# Patient Record
Sex: Female | Born: 1970 | Race: White | Hispanic: No | Marital: Single | Smoking: Current every day smoker
Health system: Southern US, Community
[De-identification: ages and names within clinical notes are randomized; demographics above are authoritative.]

## PROBLEM LIST (undated history)

## (undated) DIAGNOSIS — F419 Anxiety disorder, unspecified: Secondary | ICD-10-CM

## (undated) DIAGNOSIS — E538 Deficiency of other specified B group vitamins: Secondary | ICD-10-CM

## (undated) DIAGNOSIS — R000 Tachycardia, unspecified: Secondary | ICD-10-CM

## (undated) DIAGNOSIS — R87610 Atypical squamous cells of undetermined significance on cytologic smear of cervix (ASC-US): Secondary | ICD-10-CM

## (undated) DIAGNOSIS — K219 Gastro-esophageal reflux disease without esophagitis: Secondary | ICD-10-CM

## (undated) DIAGNOSIS — Z720 Tobacco use: Secondary | ICD-10-CM

## (undated) DIAGNOSIS — K469 Unspecified abdominal hernia without obstruction or gangrene: Secondary | ICD-10-CM

## (undated) DIAGNOSIS — D649 Anemia, unspecified: Secondary | ICD-10-CM

## (undated) DIAGNOSIS — E039 Hypothyroidism, unspecified: Secondary | ICD-10-CM

## (undated) DIAGNOSIS — E785 Hyperlipidemia, unspecified: Secondary | ICD-10-CM

## (undated) DIAGNOSIS — E669 Obesity, unspecified: Secondary | ICD-10-CM

## (undated) DIAGNOSIS — E119 Type 2 diabetes mellitus without complications: Secondary | ICD-10-CM

## (undated) HISTORY — DX: Unspecified abdominal hernia without obstruction or gangrene: K46.9

## (undated) HISTORY — PX: TYMPANOSTOMY TUBE PLACEMENT: SHX32

## (undated) HISTORY — DX: Gastro-esophageal reflux disease without esophagitis: K21.9

## (undated) HISTORY — DX: Atypical squamous cells of undetermined significance on cytologic smear of cervix (ASC-US): R87.610

## (undated) HISTORY — DX: Anxiety disorder, unspecified: F41.9

## (undated) HISTORY — PX: CHOLECYSTECTOMY: SHX55

## (undated) HISTORY — PX: APPENDECTOMY: SHX54

## (undated) HISTORY — DX: Type 2 diabetes mellitus without complications: E11.9

## (undated) HISTORY — DX: Deficiency of other specified B group vitamins: E53.8

## (undated) HISTORY — DX: Tachycardia, unspecified: R00.0

## (undated) HISTORY — PX: BLADDER SURGERY: SHX569

## (undated) HISTORY — DX: Obesity, unspecified: E66.9

## (undated) HISTORY — DX: Hyperlipidemia, unspecified: E78.5

## (undated) HISTORY — PX: WISDOM TOOTH EXTRACTION: SHX21

## (undated) HISTORY — DX: Tobacco use: Z72.0

## (undated) HISTORY — DX: Hypothyroidism, unspecified: E03.9

## (undated) HISTORY — DX: Anemia, unspecified: D64.9

---

## 2006-05-17 ENCOUNTER — Emergency Department: Payer: Self-pay | Admitting: Emergency Medicine

## 2008-07-19 ENCOUNTER — Emergency Department: Payer: Self-pay | Admitting: Emergency Medicine

## 2008-08-14 ENCOUNTER — Ambulatory Visit: Payer: Self-pay | Admitting: Family Medicine

## 2008-08-15 ENCOUNTER — Observation Stay: Payer: Self-pay

## 2010-05-08 ENCOUNTER — Ambulatory Visit: Payer: Self-pay

## 2011-11-14 ENCOUNTER — Ambulatory Visit: Payer: Self-pay | Admitting: Obstetrics and Gynecology

## 2011-11-14 LAB — CBC WITH DIFFERENTIAL/PLATELET
Basophil %: 0.2 %
Eosinophil #: 0.3 10*3/uL (ref 0.0–0.7)
Lymphocyte #: 2 10*3/uL (ref 1.0–3.6)
Lymphocyte %: 19.7 %
MCHC: 33.7 g/dL (ref 32.0–36.0)
MCV: 89 fL (ref 80–100)
Monocyte #: 0.5 10*3/uL (ref 0.0–0.7)
Monocyte %: 5 %
Neutrophil #: 7.3 10*3/uL — ABNORMAL HIGH (ref 1.4–6.5)
Neutrophil %: 72.6 %
Platelet: 223 10*3/uL (ref 150–440)
RDW: 14.8 % — ABNORMAL HIGH (ref 11.5–14.5)
WBC: 10.1 10*3/uL (ref 3.6–11.0)

## 2011-11-17 ENCOUNTER — Inpatient Hospital Stay: Payer: Self-pay | Admitting: Obstetrics and Gynecology

## 2011-11-17 HISTORY — PX: TUBAL LIGATION: SHX77

## 2011-11-18 LAB — HEMATOCRIT: HCT: 33 % — ABNORMAL LOW (ref 35.0–47.0)

## 2012-12-15 ENCOUNTER — Emergency Department: Payer: Self-pay | Admitting: Emergency Medicine

## 2012-12-15 LAB — COMPREHENSIVE METABOLIC PANEL
Glucose: 99 mg/dL (ref 65–99)
SGOT(AST): 20 U/L (ref 15–37)
SGPT (ALT): 22 U/L (ref 12–78)
Sodium: 136 mmol/L (ref 136–145)
Total Protein: 7.9 g/dL (ref 6.4–8.2)

## 2012-12-15 LAB — URINALYSIS, COMPLETE
Blood: NEGATIVE
Glucose,UR: NEGATIVE mg/dL (ref 0–75)
Ketone: NEGATIVE
Nitrite: NEGATIVE
Ph: 5 (ref 4.5–8.0)
Protein: NEGATIVE
RBC,UR: 1 /HPF (ref 0–5)
WBC UR: 11 /HPF (ref 0–5)

## 2012-12-15 LAB — CBC
HCT: 40.3 % (ref 35.0–47.0)
HGB: 13.3 g/dL (ref 12.0–16.0)
MCHC: 33.1 g/dL (ref 32.0–36.0)
Platelet: 263 10*3/uL (ref 150–440)
RBC: 4.74 10*6/uL (ref 3.80–5.20)
WBC: 10.2 10*3/uL (ref 3.6–11.0)

## 2012-12-15 LAB — LIPASE, BLOOD: Lipase: 143 U/L (ref 73–393)

## 2012-12-15 LAB — PREGNANCY, URINE: Pregnancy Test, Urine: NEGATIVE m[IU]/mL

## 2014-10-04 ENCOUNTER — Ambulatory Visit (INDEPENDENT_AMBULATORY_CARE_PROVIDER_SITE_OTHER): Payer: BC Managed Care – PPO | Admitting: Podiatry

## 2014-10-04 ENCOUNTER — Other Ambulatory Visit: Payer: Self-pay | Admitting: *Deleted

## 2014-10-04 ENCOUNTER — Ambulatory Visit (INDEPENDENT_AMBULATORY_CARE_PROVIDER_SITE_OTHER): Payer: BC Managed Care – PPO

## 2014-10-04 ENCOUNTER — Encounter: Payer: Self-pay | Admitting: Podiatry

## 2014-10-04 VITALS — BP 120/71 | HR 99 | Resp 16 | Ht 67.0 in | Wt 280.0 lb

## 2014-10-04 DIAGNOSIS — M722 Plantar fascial fibromatosis: Secondary | ICD-10-CM

## 2014-10-04 DIAGNOSIS — Q828 Other specified congenital malformations of skin: Secondary | ICD-10-CM

## 2014-10-05 NOTE — Progress Notes (Signed)
She presents today requesting orthotics. She would also like for me to trim her reactive hyperkeratotic lesions and she states that her large toenails seem to bother her.  Objective: Vital signs are stable she is alert and oriented 3. Pulses are palpable bilateral. She has some tenderness on range of motion and palpation of the first metatarsophalangeal joints bilaterally. There is no erythema edema saline as drainage or odor area she has mild tenderness on palpation of the medial calcaneal tubercles bilateral.  Assessment: Plantar fasciitis bilateral. Reactive hyperkeratosis plantar aspect of the forefoot bilaterally left greater than right.  Plan: Debridement of porokeratotic lesions. She was scanned for a set of orthotics today.

## 2014-10-25 ENCOUNTER — Encounter: Payer: Self-pay | Admitting: Podiatry

## 2014-10-25 ENCOUNTER — Ambulatory Visit (INDEPENDENT_AMBULATORY_CARE_PROVIDER_SITE_OTHER): Payer: BC Managed Care – PPO | Admitting: Podiatry

## 2014-10-25 VITALS — BP 120/71 | HR 99 | Resp 16

## 2014-10-25 DIAGNOSIS — M722 Plantar fascial fibromatosis: Secondary | ICD-10-CM

## 2014-10-25 NOTE — Progress Notes (Signed)
Pt presents to pick up orthotics. Went over wearing instructions with pt.

## 2014-10-25 NOTE — Patient Instructions (Signed)

## 2014-11-29 ENCOUNTER — Ambulatory Visit: Payer: BC Managed Care – PPO | Admitting: Podiatry

## 2015-02-02 DIAGNOSIS — R87610 Atypical squamous cells of undetermined significance on cytologic smear of cervix (ASC-US): Secondary | ICD-10-CM

## 2015-02-02 HISTORY — DX: Atypical squamous cells of undetermined significance on cytologic smear of cervix (ASC-US): R87.610

## 2015-02-07 LAB — HM PAP SMEAR: HM PAP: NEGATIVE

## 2015-02-25 NOTE — Discharge Summary (Signed)
PATIENT NAME:  Lynn French, Lynn French MR#:  [id number] DATE OF BIRTH:  [birth year]  DATE OF ADMISSION:  11/17/2011 DATE OF DISCHARGE:  11/20/2011  PRINCIPLE PROCEDURES: Elective repeat cesarean section, elective bilateral tubal ligation for sterilization.   HOSPITAL COURSE: Postop day #1 hematocrit 33.6. Patient did well throughout hospitalization. Discharged to home in good condition postop day #3 in good condition.   MEDICATIONS: 1. Vicodin. 2. Prenatal vitamins. 3. Iron.  FOLLOW UP: Patient will follow up with Dr. Ouida Sills in two weeks or before if she has wound drainage, fever, nausea, vomiting.   ____________________________ Boykin Nearing, MD tjs:cms D: 11/25/2011 12:28:42 ET T: 11/26/2011 10:31:04 ET JOB#: [id number]  cc: Boykin Nearing, MD, <Dictator> Boykin Nearing MD ELECTRONICALLY SIGNED 11/27/2011 20:33

## 2015-02-25 NOTE — Op Note (Signed)
PATIENT NAME:  Lynn French, Lynn French MR#:  [id number] DATE OF BIRTH:  1971/04/11  DATE OF PROCEDURE:  11/17/2011  PREOPERATIVE DIAGNOSES: 1. Elective repeat cesarean section.  2. Elective permanent sterilization.   POSTOPERATIVE DIAGNOSES:   1. Elective repeat cesarean section.  2. Elective permanent sterilization.   PROCEDURES:  1. Repeat low transverse cesarean section.  2. P[place]oy bilateral tubal ligation.  3. On-Q pump placement.   ANESTHESIA: Spinal.   SURGEON: Boykin Nearing, MD  FIRST ASSISTANT: [address], [place]   INDICATIONS: A 44 year old gravida 32, para 1 patient at 59 + 2 weeks estimated gestational age. Patient with prior cesarean section. Patient has elected for permanent sterilization and reconfirms this desire today, the day of the procedure.   DESCRIPTION OF PROCEDURE: After adequate spinal anesthesia, patient was placed in dorsal supine position, hip roll under the right side. Patient had previously received 3 grams intravenous Ancef for prophylaxis. Patient's abdomen was prepped and draped in normal sterile fashion. Pfannenstiel incision was made two fingerbreadths above the symphysis pubis. Sharp dissection was used to identify the fascia. Fascia was opened in the midline and opened in a transverse fashion. Superior aspect of the fascia was grasped with Kocher clamps. Recti muscles dissected free. Inferior aspect of the fascia was grasped with Kocher clamps and pyramidalis muscle was dissected free. Entry into the peritoneal cavity was accomplished sharply. Bladder blade was used to reflect the bladder inferiorly and a low transverse uterine incision was made. Upon entry into the endometrial cavity, clear fluid resulted. Incision was extended with blunt transverse traction. Fetal head was brought to the incision. Vacuum was applied to the occiput. With one gentle pull, delivery of the fetal head was accomplished. Vacuum was removed and the shoulders, body delivered  without difficulty. Vigorous female. Cord was doubly clamped and infant was passed to neonatologist, Dr. Tona Sensing, who assigned Apgar scores of 9 and 9. Intravenous Pitocin was administered while the placenta was manually delivered and the uterus was exteriorized. 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 The endometrial cavity was wiped clean with laparotomy tape and the sponge stick was used to open the cervix. Uterine incision was closed with one chromic suture in a running locking fashion, good approximation of edges, good hemostasis noted. Attention directed to the patient's right fallopian tube which was grasped at the midportion with a Babcock clamp. Two separate 0 plain gut sutures applied, 1.5 cm portion of fallopian tube removed on the right. Similar procedure was repeated on the patient's left after placing two separate 0 plain gut sutures, 1.5 cm portion of fallopian tube removed. Portion of fallopian tube sent to pathology for identification. Posterior cul-de-sac irrigated and suctioned. Uterus placed back into the abdominal cavity and the paracolic gutters were wiped cleaned with laparotomy tape. The tubal ligation sites appeared hemostatic as well as the uterine incision site. Interceed placed over the uterine incision in a T-shaped fashion. At this point the On-Q pump placement was brought up to the operative field and placed subfascially in standard fashion. Fascia was closed over top of this with 0 Vicryl suture in a running nonlocking fashion. Subcutaneous tissues were irrigated and bovied for hemostasis. Given the depth of the subcutaneous tissues, Scarpa's fascia was closed with a running 2-0 chromic suture. The rest of the skin was reapproximated with staples. Dermabond used to secure the catheters sites superior to the incision and catheters were loaded with 5 mL of 0.5% Marcaine through both catheters. Tegaderm dressing  applied over the catheters. There were no  complications. Estimated blood loss 600 mL. Intraoperative fluids 1200 mL. Patient tolerated the procedure well.  ____________________________ Boykin Nearing, MD tjs:cms D: 11/17/2011 09:07:47 ET T: 11/17/2011 09:15:36 ET JOB#: [id number]  cc: Boykin Nearing, MD, <Dictator> Boykin Nearing MD ELECTRONICALLY SIGNED 11/19/2011 9:29

## 2015-06-08 DIAGNOSIS — E039 Hypothyroidism, unspecified: Secondary | ICD-10-CM | POA: Insufficient documentation

## 2015-06-08 DIAGNOSIS — Z720 Tobacco use: Secondary | ICD-10-CM | POA: Insufficient documentation

## 2015-06-08 DIAGNOSIS — F17210 Nicotine dependence, cigarettes, uncomplicated: Secondary | ICD-10-CM | POA: Insufficient documentation

## 2015-06-08 DIAGNOSIS — E119 Type 2 diabetes mellitus without complications: Secondary | ICD-10-CM | POA: Insufficient documentation

## 2015-06-08 DIAGNOSIS — E785 Hyperlipidemia, unspecified: Secondary | ICD-10-CM | POA: Insufficient documentation

## 2015-06-08 DIAGNOSIS — F419 Anxiety disorder, unspecified: Secondary | ICD-10-CM | POA: Insufficient documentation

## 2015-06-20 ENCOUNTER — Encounter: Payer: Self-pay | Admitting: Family Medicine

## 2015-06-20 ENCOUNTER — Ambulatory Visit (INDEPENDENT_AMBULATORY_CARE_PROVIDER_SITE_OTHER): Payer: BLUE CROSS/BLUE SHIELD | Admitting: Family Medicine

## 2015-06-20 VITALS — BP 116/79 | HR 95 | Temp 97.9°F | Ht 67.5 in | Wt 275.0 lb

## 2015-06-20 DIAGNOSIS — E038 Other specified hypothyroidism: Secondary | ICD-10-CM

## 2015-06-22 NOTE — Progress Notes (Signed)
Patient was NOT seen by provider today She rescheduled

## 2015-06-22 NOTE — Assessment & Plan Note (Signed)
Reason for today's visit, but patient rescheduled and WAS NOT SEEN

## 2015-07-04 ENCOUNTER — Ambulatory Visit (INDEPENDENT_AMBULATORY_CARE_PROVIDER_SITE_OTHER): Payer: BLUE CROSS/BLUE SHIELD | Admitting: Family Medicine

## 2015-07-04 ENCOUNTER — Encounter: Payer: Self-pay | Admitting: Family Medicine

## 2015-07-04 DIAGNOSIS — Z720 Tobacco use: Secondary | ICD-10-CM

## 2015-07-04 DIAGNOSIS — E038 Other specified hypothyroidism: Secondary | ICD-10-CM | POA: Diagnosis not present

## 2015-07-04 MED ORDER — THYROID 120 MG PO TABS: 120.0000 mg | ORAL_TABLET | Freq: Every day | ORAL | Status: DC

## 2015-07-04 NOTE — Assessment & Plan Note (Signed)
She is not quite ready to quit at this time; I am here to help her quit when she is ready; she was able to quit for 6 months once, so she can do it again

## 2015-07-04 NOTE — Patient Instructions (Signed)
Check out the information at familydoctor.org entitled "What It Takes to Lose Weight" Try to lose between 1-2 pounds per week by taking in fewer calories and burning off more calories You can succeed by limiting portions, limiting foods dense in calories and fat, becoming more active, and drinking 8 glasses of water a day (64 ounces a day) Don't skip meals, especially breakfast, as skipping meals may alter your metabolism Do not use over-the-counter weight loss pills or gimmicks that claim rapid weight loss A healthy BMI (or body mass index) is between 18.5 and 24.9 You can calculate your ideal BMI at the [place] website [employer]fr Return on or after October 7th for fasting labs for diabetes We'll see how your progress is coming along in October

## 2015-07-04 NOTE — Assessment & Plan Note (Signed)
On same dose of meds for years; last two TSH levels reviewed; not due again until April 2017; refills given

## 2015-07-04 NOTE — Progress Notes (Signed)
BP 142/89 mmHg  Pulse 97  Temp(Src) 97.9 F (36.6 C)  Ht 5' 7.5" (1.715 m)  Wt 273 lb (123.832 kg)  BMI 42.10 kg/m2  SpO2 96%  LMP 06/09/2015   Subjective:    Patient ID: Lynn French, female    DOB: [birth year], 44 y.o.   MRN: [id number]  HPI: Lynn French is a 44 y.o. female  Chief Complaint  Patient presents with  . Hypothyroidism  . Obesity   She has been moving, has not had access to kitchen; has not been able to eat well; finally moved in now; going to work on it; she is thinking about pasta salad with light [place] salad dressing; going to do better; weight stable overall; does not think dietician needed at this point  Last TSH was normal in April; no constipation or loose stools; hair not falling out; skin feels normal; weight relatively stable; needs refills of the thyroid medicine; has been on thyroid medicine since seeing Dr. Ronnald Collum, saw him for several years; on medicine roughly ten years or more; dose has been stable for the last year; no radioactive thyroid treatment or surgery; has no trouble with food sticking  Relevant past medical, surgical, family and social history reviewed and updated as indicated. Interim medical history since our last visit reviewed. Allergies and medications reviewed and updated.  Review of Systems  Skin: Positive for rash (on arms and back, got into something from moving).   Per HPI unless specifically indicated above     Objective:    BP 142/89 mmHg  Pulse 97  Temp(Src) 97.9 F (36.6 C)  Ht 5' 7.5" (1.715 m)  Wt 273 lb (123.832 kg)  BMI 42.10 kg/m2  SpO2 96%  LMP 06/09/2015  Wt Readings from Last 3 Encounters:  07/04/15 273 lb (123.832 kg)  06/20/15 275 lb (124.739 kg)  02/16/15 275 lb (124.739 kg)    Physical Exam  Constitutional: She appears well-developed and well-nourished. No distress.  HENT:  Head: Normocephalic and atraumatic.  Eyes: EOM are normal. No scleral icterus.  Neck: No thyromegaly present.   Cardiovascular: Normal rate, regular rhythm and normal heart sounds.   No murmur heard. Pulmonary/Chest: Effort normal and breath sounds normal. No respiratory distress. She has no wheezes.  Abdominal: Soft. Bowel sounds are normal. She exhibits no distension.  Musculoskeletal: Normal range of motion. She exhibits no edema.  Neurological: She is alert. She exhibits normal muscle tone.  Skin: Skin is warm and dry. Rash (diffuse erythematous rash on back and arms) noted. She is not diaphoretic. No pallor.  Psychiatric: She has a normal mood and affect. Her behavior is normal. Judgment and thought content normal.    Results for orders placed or performed in visit on 06/08/15  HM PAP SMEAR  Result Value Ref Range   HM Pap smear ASCUS, HPV Negative. Next due April 2017       Assessment & Plan:   Problem List Items Addressed This Visit      Endocrine   Hypothyroidism    On same dose of meds for years; last two TSH levels reviewed; not due again until April 2017; refills given      Relevant Medications   thyroid (ARMOUR THYROID) 120 MG tablet     Other   Tobacco use    She is not quite ready to quit at this time; I am here to help her quit when she is ready; she was able to quit for 6 months once,  so she can do it again      Morbid obesity - Primary    Discussed weight loss; healthier eating pattern, use the internet to get ideas, fruits and veggies in season; aim for 1-2 pounds of weight loss per week; discussed risk of obesity including cancer, stroke, heart attack, OA, etc.         Follow up plan: Return in about 6 weeks (around 08/13/2015) for fasting labs and diabetes f/u. An after-visit summary was printed and given to the patient at [facility].  Please see the patient instructions which may contain other information and recommendations beyond what is mentioned above in the assessment and plan.  Meds ordered this encounter  Medications  . omeprazole (PRILOSEC) 20 MG  capsule    Sig: Take 20 mg by mouth daily.  Marland Kitchen thyroid (ARMOUR THYROID) 120 MG tablet    Sig: Take 1 tablet (120 mg total) by mouth daily before breakfast.    Dispense:  30 tablet    Refill:  8

## 2015-07-04 NOTE — Assessment & Plan Note (Addendum)
Discussed weight loss; healthier eating pattern, use the internet to get ideas, fruits and veggies in season; aim for 1-2 pounds of weight loss per week; discussed risk of obesity including cancer, stroke, heart attack, OA, etc.

## 2015-08-15 ENCOUNTER — Ambulatory Visit: Payer: BLUE CROSS/BLUE SHIELD | Admitting: Family Medicine

## 2015-09-26 ENCOUNTER — Encounter: Payer: Self-pay | Admitting: Family Medicine

## 2016-04-01 ENCOUNTER — Other Ambulatory Visit: Payer: Self-pay

## 2016-04-01 NOTE — Telephone Encounter (Signed)
Called and spoke to patient because [facility] sent the refill request but they are not listed in the patient's chart. Patient states she will be using them but has enough medication to get to her appointment with [facility] 04/09/17.

## 2016-04-07 ENCOUNTER — Other Ambulatory Visit: Payer: Self-pay

## 2016-04-08 MED ORDER — THYROID 120 MG PO TABS: 120.0000 mg | ORAL_TABLET | Freq: Every day | ORAL | Status: DC

## 2016-04-09 ENCOUNTER — Other Ambulatory Visit: Payer: Self-pay

## 2016-04-09 ENCOUNTER — Encounter: Payer: Self-pay | Admitting: Unknown Physician Specialty

## 2016-04-09 ENCOUNTER — Ambulatory Visit (INDEPENDENT_AMBULATORY_CARE_PROVIDER_SITE_OTHER): Payer: BLUE CROSS/BLUE SHIELD | Admitting: Unknown Physician Specialty

## 2016-04-09 VITALS — BP 108/88 | HR 83 | Temp 97.8°F | Ht 67.0 in | Wt 291.4 lb

## 2016-04-09 DIAGNOSIS — Z0000 Encounter for general adult medical examination without abnormal findings: Secondary | ICD-10-CM | POA: Diagnosis not present

## 2016-04-09 DIAGNOSIS — E038 Other specified hypothyroidism: Secondary | ICD-10-CM

## 2016-04-09 DIAGNOSIS — Z7251 High risk heterosexual behavior: Secondary | ICD-10-CM | POA: Diagnosis not present

## 2016-04-09 LAB — BAYER DCA HB A1C WAIVED: HB A1C (BAYER DCA - WAIVED): 5.8 % (ref <7.0)

## 2016-04-09 NOTE — Progress Notes (Signed)
-  Pulse 83  Temp(Src) 97.8 F (36.6 C)  Ht 5' 7"  (1.702 m)  Wt 291 lb 6.4 oz (132.178 kg)  BMI 45.63 kg/m2  SpO2 96%  LMP 04/07/2016 (Exact Date)   Subjective:    Patient ID: LADA FULBRIGHT, female    DOB: [birth year], 45 y.o.   MRN: [id number]  HPI: Lynn French is a 45 y.o. female  Chief Complaint  Patient presents with  . Hypothyroidism   Hypothyroid She has been on a stable dose for a period of time now.  Originally put on it by Dr. Felipa Evener.  She has had some weight gain in the last year.  She admits to not eating right.    STD testing States her youngest son's father has cheated on her.  She has no problems of pain or discharge but feels she should "keep a check."  Chart review notes history of Diabetes.  Pt states this is related to her time with Dr. Jacqulyn Cane who treated her for "pre pre diabetes"  Wants mammogram screening due to family history  Relevant past medical, surgical, family and social history reviewed and updated as indicated. Interim medical history since our last visit reviewed. Allergies and medications reviewed and updated.  Review of Systems  Constitutional: Negative.   HENT: Negative.   Eyes: Negative.   Respiratory: Negative.   Cardiovascular: Negative.   Gastrointestinal: Negative.   Endocrine: Negative.   Genitourinary: Negative.   Musculoskeletal: Negative.   Skin: Negative.   Allergic/Immunologic: Negative.   Neurological: Negative.   Hematological: Negative.   Psychiatric/Behavioral: Negative.     Per HPI unless specifically indicated above     Objective:    Pulse 83  Temp(Src) 97.8 F (36.6 C)  Ht 5' 7"  (1.702 m)  Wt 291 lb 6.4 oz (132.178 kg)  BMI 45.63 kg/m2  SpO2 96%  LMP 04/07/2016 (Exact Date)  Wt Readings from Last 3 Encounters:  04/09/16 291 lb 6.4 oz (132.178 kg)  07/04/15 273 lb (123.832 kg)  06/20/15 275 lb (124.739 kg)    Physical Exam  Constitutional: She is oriented to person, place, and time. She  appears well-developed and well-nourished. No distress.  HENT:  Head: Normocephalic and atraumatic.  Eyes: Conjunctivae and lids are normal. Right eye exhibits no discharge. Left eye exhibits no discharge. No scleral icterus.  Neck: Normal range of motion. Neck supple. No JVD present. Carotid bruit is not present.  Cardiovascular: Normal rate, regular rhythm and normal heart sounds.   Pulmonary/Chest: Effort normal and breath sounds normal.  Abdominal: Normal appearance. There is no splenomegaly or hepatomegaly.  Musculoskeletal: Normal range of motion.  Neurological: She is alert and oriented to person, place, and time.  Skin: Skin is warm, dry and intact. No rash noted. No pallor.  Psychiatric: She has a normal mood and affect. Her behavior is normal. Judgment and thought content normal.    Results for orders placed or performed in visit on 06/08/15  HM PAP SMEAR  Result Value Ref Range   HM Pap smear ASCUS, HPV Negative. Next due April 2017       Assessment & Plan:   Problem List Items Addressed This Visit      Unprioritized   Hypothyroidism - Primary   Relevant Orders   Thyroid Panel With TSH   Morbid obesity ([place])    Discussed diet      Relevant Orders   Bayer DCA Hb A1c Waived    Other Visit Diagnoses  High risk sexual behavior        Relevant Orders    RPR    GC/Chlamydia Probe Amp    HSV(herpes simplex vrs) 1+2 ab-IgG    HIV antibody    Routine general medical examination at a health care facility        Relevant Orders    MM DIGITAL SCREENING BILATERAL       Hgb A1C 5.8  I will remove DM from problem list.  She has been counseled on diet and exercise.    Follow up plan: Return for schedule PE.

## 2016-04-09 NOTE — Patient Instructions (Signed)
Please do call to schedule your mammogram; the number to schedule one at either [facility] or [facility] is [phone]   

## 2016-04-09 NOTE — Telephone Encounter (Signed)
Needs 90 day supply

## 2016-04-09 NOTE — Assessment & Plan Note (Signed)
Discussed diet  

## 2016-04-10 LAB — RPR: RPR: NONREACTIVE

## 2016-04-10 LAB — HIV ANTIBODY (ROUTINE TESTING W REFLEX): HIV Screen 4th Generation wRfx: NONREACTIVE

## 2016-04-10 LAB — HSV(HERPES SIMPLEX VRS) I + II AB-IGG
HSV 1 GLYCOPROTEIN G AB, IGG: 40.6 {index} — AB (ref 0.00–0.90)
HSV 2 Glycoprotein G Ab, IgG: 4.49 index — ABNORMAL HIGH (ref 0.00–0.90)

## 2016-04-10 LAB — THYROID PANEL WITH TSH
FREE THYROXINE INDEX: 1.5 (ref 1.2–4.9)
T3 UPTAKE RATIO: 22 % — AB (ref 24–39)
T4 TOTAL: 6.8 ug/dL (ref 4.5–12.0)
TSH: 0.797 u[IU]/mL (ref 0.450–4.500)

## 2016-04-10 MED ORDER — THYROID 120 MG PO TABS: 120.0000 mg | ORAL_TABLET | Freq: Every day | ORAL | Status: DC

## 2016-04-11 ENCOUNTER — Encounter: Payer: Self-pay | Admitting: Unknown Physician Specialty

## 2016-04-11 ENCOUNTER — Telehealth: Payer: Self-pay | Admitting: Unknown Physician Specialty

## 2016-04-11 LAB — GC/CHLAMYDIA PROBE AMP
CHLAMYDIA, DNA PROBE: NEGATIVE
Neisseria gonorrhoeae by PCR: NEGATIVE

## 2016-04-11 NOTE — Telephone Encounter (Signed)
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

## 2016-04-11 NOTE — Telephone Encounter (Signed)
Pt returned call

## 2016-04-11 NOTE — Telephone Encounter (Signed)
I did not call this patient. Lynn French, did you?

## 2016-05-19 ENCOUNTER — Ambulatory Visit (INDEPENDENT_AMBULATORY_CARE_PROVIDER_SITE_OTHER): Payer: BLUE CROSS/BLUE SHIELD | Admitting: Unknown Physician Specialty

## 2016-05-19 ENCOUNTER — Encounter: Payer: Self-pay | Admitting: Unknown Physician Specialty

## 2016-05-19 VITALS — BP 121/67 | HR 88 | Temp 97.9°F | Ht 67.7 in | Wt 291.8 lb

## 2016-05-19 DIAGNOSIS — F32A Depression, unspecified: Secondary | ICD-10-CM | POA: Insufficient documentation

## 2016-05-19 DIAGNOSIS — Z0000 Encounter for general adult medical examination without abnormal findings: Secondary | ICD-10-CM | POA: Diagnosis not present

## 2016-05-19 DIAGNOSIS — E669 Obesity, unspecified: Secondary | ICD-10-CM

## 2016-05-19 DIAGNOSIS — L0293 Carbuncle, unspecified: Secondary | ICD-10-CM | POA: Diagnosis not present

## 2016-05-19 DIAGNOSIS — K219 Gastro-esophageal reflux disease without esophagitis: Secondary | ICD-10-CM | POA: Diagnosis not present

## 2016-05-19 DIAGNOSIS — F329 Major depressive disorder, single episode, unspecified: Secondary | ICD-10-CM

## 2016-05-19 MED ORDER — BUPROPION HCL ER (SR) 150 MG PO TB12: 150.0000 mg | ORAL_TABLET | Freq: Two times a day (BID) | ORAL | Status: DC

## 2016-05-19 MED ORDER — OMEPRAZOLE 40 MG PO CPDR
40.0000 mg | DELAYED_RELEASE_CAPSULE | Freq: Every day | ORAL | Status: DC
Start: 2016-05-19 — End: 2017-03-18

## 2016-05-19 MED ORDER — MUPIROCIN CALCIUM 2 % EX CREA: 1.0000 "application " | TOPICAL_CREAM | Freq: Two times a day (BID) | CUTANEOUS | Status: DC

## 2016-05-19 NOTE — Assessment & Plan Note (Signed)
rx for antibiotic cream

## 2016-05-19 NOTE — Patient Instructions (Signed)
Psychology T0day: Find a therapist  Intermittnet Fasting

## 2016-05-19 NOTE — Assessment & Plan Note (Signed)
rx for Wellbutrin

## 2016-05-19 NOTE — Assessment & Plan Note (Signed)
Discussed diet and exercise 

## 2016-05-19 NOTE — Progress Notes (Signed)
+----------+  BP 121/67 mmHg  Pulse 88  Temp(Src) 97.9 F (36.6 C)  Ht 5' 7.7" (1.72 m)  Wt 291 lb 12.8 oz (132.36 kg)  BMI 44.74 kg/m2  SpO2 97%  LMP 05/07/2016 (Approximate)   Subjective:    Patient ID: Lynn French, female    DOB: 1971/09/20, 45 y.o.   MRN: [id number]  HPI: Lynn French is a 45 y.o. female  Chief Complaint  Patient presents with  . Annual Exam   Pt states she is stressed about a lot lately.  She states she took Lorazepmam in the past that she could take periodically.  She does admit to stress every day.  States she lacks motivation and feels her weight has a lot to do with this.  She works "all the time" and is a single mother.    Depression screen [facility] 2/9 05/19/2016 05/19/2016  Decreased Interest 1 0  Down, Depressed, Hopeless 3 1  PHQ - 2 Score 4 1  Altered sleeping 0 -  Tired, decreased energy 2 -  Change in appetite 1 -  Feeling bad or failure about yourself  2 -  Trouble concentrating 2 -  Moving slowly or fidgety/restless 2 -  Suicidal thoughts 0 -  PHQ-9 Score 13 -   Relevant past medical, surgical, family and social history reviewed and updated as indicated. Interim medical history since our last visit reviewed. Allergies and medications reviewed and updated.  Review of Systems  Constitutional: Negative.   HENT: Negative.   Eyes: Negative.   Respiratory: Negative.   Cardiovascular: Negative.   Gastrointestinal: Negative.        Would like 40 mg of Omeprazole  Endocrine: Negative.   Genitourinary: Negative.   Musculoskeletal: Negative.   Skin: Negative.        Recurrent boils under arms  Allergic/Immunologic: Negative.   Neurological: Negative.   Hematological: Negative.   Psychiatric/Behavioral: Negative.     Per HPI unless specifically indicated above     Objective:    BP 121/67 mmHg  Pulse 88  Temp(Src) 97.9 F (36.6 C)  Ht 5' 7.7" (1.72 m)  Wt 291 lb 12.8 oz (132.36 kg)  BMI 44.74 kg/m2  SpO2 97%  LMP 05/07/2016  (Approximate)  Wt Readings from Last 3 Encounters:  05/19/16 291 lb 12.8 oz (132.36 kg)  04/09/16 291 lb 6.4 oz (132.178 kg)  07/04/15 273 lb (123.832 kg)    Physical Exam  Constitutional: She is oriented to person, place, and time. She appears well-developed and well-nourished.  HENT:  Head: Normocephalic and atraumatic.  Eyes: Pupils are equal, round, and reactive to light. Right eye exhibits no discharge. Left eye exhibits no discharge. No scleral icterus.  Neck: Normal range of motion. Neck supple. Carotid bruit is not present. No thyromegaly present.  Cardiovascular: Normal rate, regular rhythm and normal heart sounds.  Exam reveals no gallop and no friction rub.   No murmur heard. Pulmonary/Chest: Effort normal and breath sounds normal. No respiratory distress. She has no wheezes. She has no rales.  Abdominal: Soft. Bowel sounds are normal. There is no tenderness. There is no rebound.  Genitourinary: Vagina normal and uterus normal. No breast swelling, tenderness or discharge. Cervix exhibits no motion tenderness, no discharge and no friability. Right adnexum displays no mass, no tenderness and no fullness. Left adnexum displays no mass, no tenderness and no fullness.  Musculoskeletal: Normal range of motion.  Lymphadenopathy:    She has no cervical adenopathy.  Neurological: She is  alert and oriented to person, place, and time.  Skin: Skin is warm, dry and intact. No rash noted.  Psychiatric: She has a normal mood and affect. Her speech is normal and behavior is normal. Judgment and thought content normal. Cognition and memory are normal.      Assessment & Plan:   Problem List Items Addressed This Visit      Unprioritized   Depression - Primary    rx for Wellbutrin      Relevant Medications   buPROPion (WELLBUTRIN SR) 150 MG 12 hr tablet   GERD (gastroesophageal reflux disease)    Would like an rx for 40 mg      Relevant Medications   omeprazole (PRILOSEC) 40 MG  capsule   Obesity    Discussed diet and exercise      Relevant Orders   Lipid Panel w/o Chol/HDL Ratio   Comprehensive metabolic panel   CBC with Differential/Platelet   Recurrent boils    rx for antibiotic cream      Relevant Medications   mupirocin cream (BACTROBAN) 2 %    Other Visit Diagnoses    Annual physical exam        Relevant Orders    IGP, Aptima HPV, rfx 16/18,45        Follow up plan: Return in about 4 weeks (around 06/16/2016).

## 2016-05-19 NOTE — Assessment & Plan Note (Signed)
Would like an rx for 40 mg

## 2016-05-20 ENCOUNTER — Encounter: Payer: Self-pay | Admitting: Unknown Physician Specialty

## 2016-05-20 LAB — LIPID PANEL W/O CHOL/HDL RATIO
CHOLESTEROL TOTAL: 165 mg/dL (ref 100–199)
HDL: 41 mg/dL (ref >39)
LDL CALC: 101 mg/dL — AB (ref 0–99)
Triglycerides: 114 mg/dL (ref 0–149)
VLDL CHOLESTEROL CAL: 23 mg/dL (ref 5–40)

## 2016-05-20 LAB — COMPREHENSIVE METABOLIC PANEL
A/G RATIO: 1.4 (ref 1.2–2.2)
ALK PHOS: 101 IU/L (ref 39–117)
ALT: 25 IU/L (ref 0–32)
AST: 24 IU/L (ref 0–40)
Albumin: 3.8 g/dL (ref 3.5–5.5)
BUN/Creatinine Ratio: 14 (ref 9–23)
BUN: 10 mg/dL (ref 6–24)
CALCIUM: 9 mg/dL (ref 8.7–10.2)
CO2: 26 mmol/L (ref 18–29)
Chloride: 102 mmol/L (ref 96–106)
Creatinine, Ser: 0.69 mg/dL (ref 0.57–1.00)
GFR, EST AFRICAN AMERICAN: 124 mL/min/{1.73_m2} (ref >59)
GFR, EST NON AFRICAN AMERICAN: 108 mL/min/{1.73_m2} (ref >59)
Globulin, Total: 2.8 g/dL (ref 1.5–4.5)
Glucose: 99 mg/dL (ref 65–99)
Potassium: 5 mmol/L (ref 3.5–5.2)
Sodium: 140 mmol/L (ref 134–144)
Total Protein: 6.6 g/dL (ref 6.0–8.5)

## 2016-05-20 LAB — CBC WITH DIFFERENTIAL/PLATELET
BASOS: 0 %
Basophils Absolute: 0 10*3/uL (ref 0.0–0.2)
EOS (ABSOLUTE): 0.6 10*3/uL — ABNORMAL HIGH (ref 0.0–0.4)
Eos: 7 %
HEMOGLOBIN: 12.2 g/dL (ref 11.1–15.9)
Hematocrit: 37.2 % (ref 34.0–46.6)
IMMATURE GRANS (ABS): 0 10*3/uL (ref 0.0–0.1)
IMMATURE GRANULOCYTES: 0 %
LYMPHS: 32 %
Lymphocytes Absolute: 2.7 10*3/uL (ref 0.7–3.1)
MCH: 28.2 pg (ref 26.6–33.0)
MCHC: 32.8 g/dL (ref 31.5–35.7)
MCV: 86 fL (ref 79–97)
MONOCYTES: 6 %
Monocytes Absolute: 0.5 10*3/uL (ref 0.1–0.9)
NEUTROS ABS: 4.5 10*3/uL (ref 1.4–7.0)
Neutrophils: 55 %
Platelets: 277 10*3/uL (ref 150–379)
RBC: 4.32 x10E6/uL (ref 3.77–5.28)
RDW: 15.6 % — ABNORMAL HIGH (ref 12.3–15.4)
WBC: 8.3 10*3/uL (ref 3.4–10.8)

## 2016-05-21 LAB — IGP, APTIMA HPV, RFX 16/18,45
HPV Aptima: NEGATIVE
PAP Smear Comment: 0

## 2016-06-17 ENCOUNTER — Telehealth: Payer: Self-pay | Admitting: Unknown Physician Specialty

## 2016-06-17 NOTE — Telephone Encounter (Signed)
Pts feet are swollen and feels like it may possibly be from her wellbutrin.

## 2016-06-17 NOTE — Telephone Encounter (Signed)
Called and spoke to patient. She said that her feet have been swollen for about a week now and have done this since starting the wellbutrin. Patient states she has an appointment scheduled for Monday but doesn't know what she should do or if a new medication needs to be sent in.

## 2016-06-18 MED ORDER — HYDROCHLOROTHIAZIDE 25 MG PO TABS: 25.0000 mg | ORAL_TABLET | Freq: Every day | ORAL | 0 refills | Status: DC

## 2016-06-18 NOTE — Telephone Encounter (Signed)
Called and notified patient of what Malachy Mood said. Let patient know about new med that was sent in and asked for her to give [place] a call if she has any questions.

## 2016-06-18 NOTE — Telephone Encounter (Signed)
At is not a typical side effect of Wellbutrin but that doesn't mean it's not due to the Wellbutrin.  I would like to call her in a fluid pill to see if it helps

## 2016-06-23 ENCOUNTER — Ambulatory Visit (INDEPENDENT_AMBULATORY_CARE_PROVIDER_SITE_OTHER): Payer: BLUE CROSS/BLUE SHIELD | Admitting: Unknown Physician Specialty

## 2016-06-23 ENCOUNTER — Encounter: Payer: Self-pay | Admitting: Unknown Physician Specialty

## 2016-06-23 DIAGNOSIS — F329 Major depressive disorder, single episode, unspecified: Secondary | ICD-10-CM

## 2016-06-23 DIAGNOSIS — F32A Depression, unspecified: Secondary | ICD-10-CM

## 2016-06-23 DIAGNOSIS — R600 Localized edema: Secondary | ICD-10-CM

## 2016-06-23 DIAGNOSIS — R609 Edema, unspecified: Secondary | ICD-10-CM | POA: Insufficient documentation

## 2016-06-23 MED ORDER — BUPROPION HCL ER (SR) 150 MG PO TB12: 150.0000 mg | ORAL_TABLET | Freq: Two times a day (BID) | ORAL | 1 refills | Status: DC

## 2016-06-23 NOTE — Assessment & Plan Note (Signed)
Improved.  Continue Wellbutrin

## 2016-06-23 NOTE — Progress Notes (Signed)
BP 135/84 (BP Location: Left Arm, Patient Position: Sitting, Cuff Size: Large)   Pulse 82   Temp 97.5 F (36.4 C)   Ht 5' 8.2" (1.732 m)   Wt 289 lb 6.4 oz (131.3 kg)   LMP 06/04/2016 (Approximate)   SpO2 96%   BMI 43.75 kg/m    Subjective:    Patient ID: Lynn French, female    DOB: August 14, 1971, 45 y.o.   MRN: [id number]  HPI: Lynn French is a 45 y.o. female  Chief Complaint  Patient presents with  . Depression    4 week f/up   About 4 weeks ago started her on Wellbutrin and states she is doing better.  She does report some blurred vision which seems more frequent.  She gets a headache a couple of times of week relieved by Tylenol.  She wonders if it is making her BP go up.  She did have some swelling in her feet and I called in some HCTZ and that seems betterl  Depression screen [facility] 2/9 06/23/2016 05/19/2016 05/19/2016  Decreased Interest - 1 0  Down, Depressed, Hopeless - 3 1  PHQ - 2 Score - 4 1  Altered sleeping 1 0 -  Tired, decreased energy 0 2 -  Change in appetite - 1 -  Feeling bad or failure about yourself  - 2 -  Trouble concentrating - 2 -  Moving slowly or fidgety/restless - 2 -  Suicidal thoughts - 0 -  PHQ-9 Score - 13 -      Relevant past medical, surgical, family and social history reviewed and updated as indicated. Interim medical history since our last visit reviewed. Allergies and medications reviewed and updated.  Review of Systems  Per HPI unless specifically indicated above     Objective:    BP 135/84 (BP Location: Left Arm, Patient Position: Sitting, Cuff Size: Large)   Pulse 82   Temp 97.5 F (36.4 C)   Ht 5' 8.2" (1.732 m)   Wt 289 lb 6.4 oz (131.3 kg)   LMP 06/04/2016 (Approximate)   SpO2 96%   BMI 43.75 kg/m   Wt Readings from Last 3 Encounters:  06/23/16 289 lb 6.4 oz (131.3 kg)  05/19/16 291 lb 12.8 oz (132.4 kg)  04/09/16 291 lb 6.4 oz (132.2 kg)    Physical Exam  Constitutional: She is oriented to person, place,  and time. She appears well-developed and well-nourished. No distress.  HENT:  Head: Normocephalic and atraumatic.  Eyes: Conjunctivae and lids are normal. Right eye exhibits no discharge. Left eye exhibits no discharge. No scleral icterus.  Neck: Normal range of motion. Neck supple. No JVD present. Carotid bruit is not present.  Cardiovascular: Normal rate, regular rhythm and normal heart sounds.   Pulmonary/Chest: Effort normal and breath sounds normal.  Abdominal: Normal appearance. There is no splenomegaly or hepatomegaly.  Musculoskeletal: Normal range of motion.  Neurological: She is alert and oriented to person, place, and time.  Skin: Skin is warm, dry and intact. No rash noted. No pallor.  Psychiatric: She has a normal mood and affect. Her behavior is normal. Judgment and thought content normal.    Results for orders placed or performed in visit on 05/19/16  Lipid Panel w/o Chol/HDL Ratio  Result Value Ref Range   Cholesterol, Total 165 100 - 199 mg/dL   Triglycerides 114 0 - 149 mg/dL   HDL 41 >39 mg/dL   VLDL Cholesterol Cal 23 5 - 40 mg/dL  LDL Calculated 101 (H) 0 - 99 mg/dL  Comprehensive metabolic panel  Result Value Ref Range   Glucose 99 65 - 99 mg/dL   BUN 10 6 - 24 mg/dL   Creatinine, Ser 0.69 0.57 - 1.00 mg/dL   GFR calc non Af Amer 108 >59 mL/min/1.73   GFR calc Af Amer 124 >59 mL/min/1.73   BUN/Creatinine Ratio 14 9 - 23   Sodium 140 134 - 144 mmol/L   Potassium 5.0 3.5 - 5.2 mmol/L   Chloride 102 96 - 106 mmol/L   CO2 26 18 - 29 mmol/L   Calcium 9.0 8.7 - 10.2 mg/dL   Total Protein 6.6 6.0 - 8.5 g/dL   Albumin 3.8 3.5 - 5.5 g/dL   Globulin, Total 2.8 1.5 - 4.5 g/dL   Albumin/Globulin Ratio 1.4 1.2 - 2.2   Bilirubin Total <0.2 0.0 - 1.2 mg/dL   Alkaline Phosphatase 101 39 - 117 IU/L   AST 24 0 - 40 IU/L   ALT 25 0 - 32 IU/L  CBC with Differential/Platelet  Result Value Ref Range   WBC 8.3 3.4 - 10.8 x10E3/uL   RBC 4.32 3.77 - 5.28 x10E6/uL    Hemoglobin 12.2 11.1 - 15.9 g/dL   Hematocrit 37.2 34.0 - 46.6 %   MCV 86 79 - 97 fL   MCH 28.2 26.6 - 33.0 pg   MCHC 32.8 31.5 - 35.7 g/dL   RDW 15.6 (H) 12.3 - 15.4 %   Platelets 277 150 - 379 x10E3/uL   Neutrophils 55 %   Lymphs 32 %   Monocytes 6 %   Eos 7 %   Basos 0 %   Neutrophils Absolute 4.5 1.4 - 7.0 x10E3/uL   Lymphocytes Absolute 2.7 0.7 - 3.1 x10E3/uL   Monocytes Absolute 0.5 0.1 - 0.9 x10E3/uL   EOS (ABSOLUTE) 0.6 (H) 0.0 - 0.4 x10E3/uL   Basophils Absolute 0.0 0.0 - 0.2 x10E3/uL   Immature Granulocytes 0 %   Immature Grans (Abs) 0.0 0.0 - 0.1 x10E3/uL  IGP, Aptima HPV, rfx 16/18,45  Result Value Ref Range   DIAGNOSIS: Comment    Specimen adequacy: Comment    CLINICIAN PROVIDED ICD10: Comment    Performed by: Comment    PAP SMEAR COMMENT .    Note: Comment    Test Methodology Comment    HPV Aptima Negative Negative      Assessment & Plan:   Problem List Items Addressed This Visit      Unprioritized   Depression    Improved.  Continue Wellbutrin      Relevant Medications   buPROPion (WELLBUTRIN SR) 150 MG 12 hr tablet   Edema    Improved with HCTZ       Other Visit Diagnoses   None.      Follow up plan: Return in about 3 months (around 09/23/2016).

## 2016-06-23 NOTE — Assessment & Plan Note (Signed)
Improved with HCTZ

## 2016-07-09 ENCOUNTER — Ambulatory Visit (INDEPENDENT_AMBULATORY_CARE_PROVIDER_SITE_OTHER): Payer: BLUE CROSS/BLUE SHIELD | Admitting: Family Medicine

## 2016-07-09 ENCOUNTER — Encounter: Payer: Self-pay | Admitting: Family Medicine

## 2016-07-09 VITALS — BP 134/80 | HR 82 | Temp 97.9°F | Wt 290.0 lb

## 2016-07-09 DIAGNOSIS — R600 Localized edema: Secondary | ICD-10-CM | POA: Diagnosis not present

## 2016-07-09 MED ORDER — HYDROCHLOROTHIAZIDE 25 MG PO TABS: 50.0000 mg | ORAL_TABLET | Freq: Every day | ORAL | 0 refills | Status: DC

## 2016-07-09 NOTE — Progress Notes (Signed)
BP 134/80   Pulse 82   Temp 97.9 F (36.6 C)   Wt 290 lb (131.5 kg)   LMP 07/04/2016 (Approximate)   SpO2 95%   BMI 43.84 kg/m    Subjective:    Patient ID: Lynn French, female    DOB: 14-Jul-1971, 45 y.o.   MRN: [id number]  HPI: Lynn French is a 45 y.o. female  Chief Complaint  Patient presents with  . Foot Swelling    bilateral, left swells more and is more painful. worse at night after being on her feet   B/l LE edema x 1-2 months. Left seems to be worse than right. Seems to have noticed it since starting the wellbutrin about 2 months ago. States she's always had some swelling off and on especially with long periods of standing, but the issue is more persistent now. Was placed on low dose HCTZ at 1 month follow up which seems to have helped some. Has been trying to elevate legs, but has not done compression stockings. No calf pain, redness, or CP, SOB. Doing very well on the Wellbutrin and feeling like it is helping her control her diet and also helping her cut down some on cigarettes.  Still only taking half prescribed dose of wellbutrin.   Also feeling like she may have restless legs as she gets a strange tingly sensation if she isn't constantly moving her legs.   Relevant past medical, surgical, family and social history reviewed and updated as indicated. Interim medical history since our last visit reviewed. Allergies and medications reviewed and updated.  Review of Systems  Constitutional: Negative.   HENT: Negative.   Respiratory: Negative.   Cardiovascular: Positive for leg swelling.  Gastrointestinal: Negative.   Musculoskeletal: Negative.   Neurological: Negative.   Psychiatric/Behavioral: Negative.     Per HPI unless specifically indicated above     Objective:    BP 134/80   Pulse 82   Temp 97.9 F (36.6 C)   Wt 290 lb (131.5 kg)   LMP 07/04/2016 (Approximate)   SpO2 95%   BMI 43.84 kg/m   Wt Readings from Last 3 Encounters:  07/09/16 290  lb (131.5 kg)  06/23/16 289 lb 6.4 oz (131.3 kg)  05/19/16 291 lb 12.8 oz (132.4 kg)    Physical Exam  Constitutional: She is oriented to person, place, and time. She appears well-developed and well-nourished.  HENT:  Head: Atraumatic.  Eyes: Conjunctivae are normal. No scleral icterus.  Neck: Normal range of motion. Neck supple.  Cardiovascular: Normal rate, normal heart sounds and intact distal pulses.   Pulmonary/Chest: Effort normal and breath sounds normal.  Musculoskeletal: Normal range of motion. She exhibits edema (1+ edema in b/l distal LEs) and tenderness (Mild TTP over areas of worst swelling, mostly tops of feet).  Neurological: She is alert and oriented to person, place, and time.  Skin: Skin is warm and dry.  Psychiatric: She has a normal mood and affect. Her behavior is normal.        Assessment & Plan:   Problem List Items Addressed This Visit    None    Visit Diagnoses    Bilateral leg edema    -  Primary    Discussed at length risks vs benefits of staying on the wellbutrin. Patient agreeable to continuing at 150 mg daily and increasing HCTZ as well as working on lifestyle modification (smoking cessation, elevation, compression stockings, weight loss). Will follow up in about a month to see  if any improvement. Will discuss weaning off at that time if no improvement.    Follow up plan: Return in about 4 weeks (around 08/06/2016) for Medication follow up.

## 2016-07-09 NOTE — Patient Instructions (Signed)
Can try an iron supplement to help with the restless leg feeling - 325 mg Compression stockings Elevation

## 2016-07-16 ENCOUNTER — Telehealth: Payer: Self-pay | Admitting: Family Medicine

## 2016-07-16 NOTE — Telephone Encounter (Signed)
Pt would like to know if she could increase the dosage of her fluid pill to help with her foot pain. She stated that it has gotten better but it is still a little swollen and she feels as if this will help.

## 2016-07-16 NOTE — Telephone Encounter (Signed)
Called and left patient a voicemail letting her know what Apolonio Schneiders said. Asked for patient to call back if she has questions or if she would like to come in sooner.

## 2016-07-16 NOTE — Telephone Encounter (Signed)
Routing to provider  

## 2016-07-16 NOTE — Telephone Encounter (Signed)
She should have a follow up scheduled in a few weeks, I will need to see her and check her labs and vitals before increasing this dosage. If it is not tolerable in the meantime, she should come in sooner to discuss coming off the wellbutrin.

## 2016-08-04 ENCOUNTER — Ambulatory Visit: Payer: BLUE CROSS/BLUE SHIELD | Admitting: Family Medicine

## 2016-08-11 ENCOUNTER — Ambulatory Visit (INDEPENDENT_AMBULATORY_CARE_PROVIDER_SITE_OTHER): Payer: BLUE CROSS/BLUE SHIELD | Admitting: Family Medicine

## 2016-08-11 ENCOUNTER — Encounter: Payer: Self-pay | Admitting: Family Medicine

## 2016-08-11 VITALS — BP 125/76 | HR 73 | Temp 98.3°F | Wt 287.0 lb

## 2016-08-11 DIAGNOSIS — R600 Localized edema: Secondary | ICD-10-CM | POA: Diagnosis not present

## 2016-08-11 DIAGNOSIS — K5909 Other constipation: Secondary | ICD-10-CM

## 2016-08-11 MED ORDER — POLYETHYLENE GLYCOL 3350 17 GM/SCOOP PO POWD: 17.0000 g | Freq: Every day | ORAL | 6 refills | Status: DC

## 2016-08-11 MED ORDER — HYDROCHLOROTHIAZIDE 25 MG PO TABS: 50.0000 mg | ORAL_TABLET | Freq: Every day | ORAL | 1 refills | Status: DC

## 2016-08-11 NOTE — Progress Notes (Signed)
BP 125/76   Pulse 73   Temp 98.3 F (36.8 C)   Wt 287 lb (130.2 kg)   LMP 07/23/2016 (Approximate)   SpO2 96%   BMI 43.38 kg/m    Subjective:    Patient ID: Lynn French, female    DOB: 12-07-1970, 45 y.o.   MRN: [id number]  HPI: Lynn French is a 45 y.o. female  Chief Complaint  Patient presents with  . Edema    Follow up on leg edema, increased HCTZ to 68m at last visit. Doing much better.   Patient presents for follow up of b/l LE edema. Increased to 50 mg at previous visit and seems to be doing very well with this dose. Swelling is much improved, and no more dull aching in feet. Has not had any dizziness, light headedness, or syncope with dose increase. Taking medication daily.   Tried iron supplement to help with RLS sxs, but has constipation issues already and states it worsened them. Has been taking laxatives prn with good relief.   Relevant past medical, surgical, family and social history reviewed and updated as indicated. Interim medical history since our last visit reviewed. Allergies and medications reviewed and updated.  Review of Systems  Constitutional: Negative.   HENT: Negative.   Respiratory: Negative.   Cardiovascular: Negative.   Gastrointestinal: Positive for constipation.  Genitourinary: Negative.   Musculoskeletal: Negative.   Skin: Negative.   Neurological: Negative.   Psychiatric/Behavioral: Negative.     Per HPI unless specifically indicated above     Objective:    BP 125/76   Pulse 73   Temp 98.3 F (36.8 C)   Wt 287 lb (130.2 kg)   LMP 07/23/2016 (Approximate)   SpO2 96%   BMI 43.38 kg/m   Wt Readings from Last 3 Encounters:  08/11/16 287 lb (130.2 kg)  07/09/16 290 lb (131.5 kg)  06/23/16 289 lb 6.4 oz (131.3 kg)    Physical Exam  Constitutional: She is oriented to person, place, and time. She appears well-developed and well-nourished. No distress.  HENT:  Head: Atraumatic.  Eyes: Conjunctivae are normal. Pupils are  equal, round, and reactive to light. No scleral icterus.  Neck: Normal range of motion. Neck supple.  Cardiovascular: Normal rate, regular rhythm, normal heart sounds and intact distal pulses.   Pulmonary/Chest: Effort normal and breath sounds normal. No respiratory distress.  Musculoskeletal: Normal range of motion. She exhibits edema (trace edema in b/l LEs). She exhibits no tenderness (LE's nontender to palpation b/l).  Lymphadenopathy:    She has no cervical adenopathy.  Neurological: She is alert and oriented to person, place, and time.  Skin: Skin is warm and dry. No rash noted. No erythema.  Psychiatric: She has a normal mood and affect. Her behavior is normal.  Nursing note and vitals reviewed.       Assessment & Plan:   Problem List Items Addressed This Visit    None    Visit Diagnoses    Bilateral leg edema    -  Primary   Much improved with 50 mg HCTZ. Continue with current regimen. Discussed that she can adjust down as tolerated back to 25 mg. Await CMP results.    Relevant Orders   Comprehensive metabolic panel   Other constipation       Recommended miralax regimen daily rather than prn laxatives to keep her regular with less addictive potential. Encouraged good fluid intake and fiber supplement       Follow  up plan: Return for As scheduled.

## 2016-08-11 NOTE — Patient Instructions (Signed)
Follow up as needed

## 2016-08-12 ENCOUNTER — Encounter: Payer: Self-pay | Admitting: Family Medicine

## 2016-08-12 LAB — COMPREHENSIVE METABOLIC PANEL
A/G RATIO: 1.1 — AB (ref 1.2–2.2)
ALK PHOS: 112 IU/L (ref 39–117)
ALT: 24 IU/L (ref 0–32)
AST: 22 IU/L (ref 0–40)
Albumin: 3.4 g/dL — ABNORMAL LOW (ref 3.5–5.5)
BUN / CREAT RATIO: 8 — AB (ref 9–23)
BUN: 6 mg/dL (ref 6–24)
Bilirubin Total: <0.2 mg/dL (ref 0.0–1.2)
CALCIUM: 9 mg/dL (ref 8.7–10.2)
CO2: 27 mmol/L (ref 18–29)
CREATININE: 0.79 mg/dL (ref 0.57–1.00)
Chloride: 96 mmol/L (ref 96–106)
GFR calc Af Amer: 107 mL/min/{1.73_m2} (ref >59)
GFR calc non Af Amer: 93 mL/min/{1.73_m2} (ref >59)
GLUCOSE: 134 mg/dL — AB (ref 65–99)
Globulin, Total: 3 g/dL (ref 1.5–4.5)
POTASSIUM: 3.8 mmol/L (ref 3.5–5.2)
SODIUM: 138 mmol/L (ref 134–144)
Total Protein: 6.4 g/dL (ref 6.0–8.5)

## 2016-09-22 ENCOUNTER — Ambulatory Visit (INDEPENDENT_AMBULATORY_CARE_PROVIDER_SITE_OTHER): Payer: BLUE CROSS/BLUE SHIELD | Admitting: Unknown Physician Specialty

## 2016-09-22 ENCOUNTER — Ambulatory Visit: Payer: BLUE CROSS/BLUE SHIELD | Admitting: Unknown Physician Specialty

## 2016-09-22 ENCOUNTER — Encounter: Payer: Self-pay | Admitting: Unknown Physician Specialty

## 2016-09-22 VITALS — BP 126/82 | HR 79 | Temp 97.7°F | Ht 67.7 in | Wt 278.0 lb

## 2016-09-22 DIAGNOSIS — F320 Major depressive disorder, single episode, mild: Secondary | ICD-10-CM

## 2016-09-22 DIAGNOSIS — R600 Localized edema: Secondary | ICD-10-CM

## 2016-09-22 DIAGNOSIS — Z23 Encounter for immunization: Secondary | ICD-10-CM | POA: Diagnosis not present

## 2016-09-22 NOTE — Assessment & Plan Note (Signed)
Stable, continue present medications.   

## 2016-09-22 NOTE — Assessment & Plan Note (Signed)
Improved.  Discussed she can take 25 or 50 mg

## 2016-09-22 NOTE — Patient Instructions (Signed)

## 2016-09-22 NOTE — Progress Notes (Signed)
BP 126/82 (BP Location: Left Arm, Patient Position: Sitting, Cuff Size: Large)   Pulse 79   Temp 97.7 F (36.5 C)   Ht 5' 7.7" (1.72 m)   Wt 278 lb (126.1 kg)   LMP 09/21/2016 (Exact Date)   SpO2 97%   BMI 42.65 kg/m    Subjective:    Patient ID: Lynn French, female    DOB: 03-12-1971, 45 y.o.   MRN: [id number]  HPI: Lynn French is a 45 y.o. female  No chief complaint on file.  Depression This is doing well Depression screen [facility] 2/9 09/22/2016 06/23/2016 05/19/2016 05/19/2016  Decreased Interest 1 - 1 0  Down, Depressed, Hopeless 0 - 3 1  PHQ - 2 Score 1 - 4 1  Altered sleeping 0 1 0 -  Tired, decreased energy 0 0 2 -  Change in appetite 0 - 1 -  Feeling bad or failure about yourself  0 - 2 -  Trouble concentrating 0 - 2 -  Moving slowly or fidgety/restless 0 - 2 -  Suicidal thoughts 0 - 0 -  PHQ-9 Score 1 - 13 -   Edema Doing better on 50 mg of HCTZ   Relevant past medical, surgical, family and social history reviewed and updated as indicated. Interim medical history since our last visit reviewed. Allergies and medications reviewed and updated.  Review of Systems  Per HPI unless specifically indicated above     Objective:    BP 126/82 (BP Location: Left Arm, Patient Position: Sitting, Cuff Size: Large)   Pulse 79   Temp 97.7 F (36.5 C)   Ht 5' 7.7" (1.72 m)   Wt 278 lb (126.1 kg)   LMP 09/21/2016 (Exact Date)   SpO2 97%   BMI 42.65 kg/m   Wt Readings from Last 3 Encounters:  09/22/16 278 lb (126.1 kg)  08/11/16 287 lb (130.2 kg)  07/09/16 290 lb (131.5 kg)    Physical Exam  Constitutional: She is oriented to person, place, and time. She appears well-developed and well-nourished. No distress.  HENT:  Head: Normocephalic and atraumatic.  Eyes: Conjunctivae and lids are normal. Right eye exhibits no discharge. Left eye exhibits no discharge. No scleral icterus.  Neck: Normal range of motion. Neck supple. No JVD present. Carotid bruit is not  present.  Cardiovascular: Normal rate, regular rhythm and normal heart sounds.   Pulmonary/Chest: Effort normal and breath sounds normal.  Abdominal: Normal appearance. There is no splenomegaly or hepatomegaly.  Musculoskeletal: Normal range of motion.  Neurological: She is alert and oriented to person, place, and time.  Skin: Skin is warm, dry and intact. No rash noted. No pallor.  Psychiatric: She has a normal mood and affect. Her behavior is normal. Judgment and thought content normal.    Results for orders placed or performed in visit on 08/11/16  Comprehensive metabolic panel  Result Value Ref Range   Glucose 134 (H) 65 - 99 mg/dL   BUN 6 6 - 24 mg/dL   Creatinine, Ser 0.79 0.57 - 1.00 mg/dL   GFR calc non Af Amer 93 >59 mL/min/1.73   GFR calc Af Amer 107 >59 mL/min/1.73   BUN/Creatinine Ratio 8 (L) 9 - 23   Sodium 138 134 - 144 mmol/L   Potassium 3.8 3.5 - 5.2 mmol/L   Chloride 96 96 - 106 mmol/L   CO2 27 18 - 29 mmol/L   Calcium 9.0 8.7 - 10.2 mg/dL   Total Protein 6.4 6.0 - 8.5  g/dL   Albumin 3.4 (L) 3.5 - 5.5 g/dL   Globulin, Total 3.0 1.5 - 4.5 g/dL   Albumin/Globulin Ratio 1.1 (L) 1.2 - 2.2   Bilirubin Total <0.2 0.0 - 1.2 mg/dL   Alkaline Phosphatase 112 39 - 117 IU/L   AST 22 0 - 40 IU/L   ALT 24 0 - 32 IU/L      Assessment & Plan:   Problem List Items Addressed This Visit      Unprioritized   Depression    Stable, continue present medications.        Edema    Improved.  Discussed she can take 25 or 50 mg       Other Visit Diagnoses    Need for influenza vaccination    -  Primary   Relevant Orders   Flu Vaccine QUAD 36+ mos IM (Completed)       Follow up plan: Return in about 6 months (around 03/22/2017).

## 2016-12-27 ENCOUNTER — Other Ambulatory Visit: Payer: Self-pay | Admitting: Unknown Physician Specialty

## 2017-03-18 ENCOUNTER — Other Ambulatory Visit: Payer: Self-pay | Admitting: Unknown Physician Specialty

## 2017-03-23 ENCOUNTER — Other Ambulatory Visit: Payer: Self-pay | Admitting: Family Medicine

## 2017-03-23 ENCOUNTER — Ambulatory Visit: Payer: BLUE CROSS/BLUE SHIELD | Admitting: Unknown Physician Specialty

## 2017-03-23 NOTE — Telephone Encounter (Signed)
Routing to provider. Appt with Malachy Mood on 04/07/17

## 2017-03-30 ENCOUNTER — Other Ambulatory Visit: Payer: Self-pay | Admitting: Family Medicine

## 2017-04-07 ENCOUNTER — Encounter: Payer: Self-pay | Admitting: Unknown Physician Specialty

## 2017-04-07 ENCOUNTER — Ambulatory Visit (INDEPENDENT_AMBULATORY_CARE_PROVIDER_SITE_OTHER): Payer: BLUE CROSS/BLUE SHIELD | Admitting: Unknown Physician Specialty

## 2017-04-07 DIAGNOSIS — E038 Other specified hypothyroidism: Secondary | ICD-10-CM

## 2017-04-07 DIAGNOSIS — R7301 Impaired fasting glucose: Secondary | ICD-10-CM | POA: Diagnosis not present

## 2017-04-07 DIAGNOSIS — I10 Essential (primary) hypertension: Secondary | ICD-10-CM | POA: Diagnosis not present

## 2017-04-07 DIAGNOSIS — F320 Major depressive disorder, single episode, mild: Secondary | ICD-10-CM | POA: Diagnosis not present

## 2017-04-07 NOTE — Patient Instructions (Signed)
Psychology today>Find a Social worker

## 2017-04-07 NOTE — Progress Notes (Signed)
BP 117/77   Pulse 81   Temp 98 F (36.7 C)   Ht 5' 8.1" (1.73 m)   Wt 282 lb (127.9 kg)   SpO2 95%   BMI 42.75 kg/m    Subjective:    Patient ID: Lynn French, female    DOB: 05/26/1971, 46 y.o.   MRN: [id number]  HPI: Lynn French is a 46 y.o. female  Chief Complaint  Patient presents with  . Depression  . Hypertension  . Hypothyroidism   Depression Not to goal.  States she is so emotional with many things going through her mind.  She is using adult coloring books which is helping.  She is frequently tearful.  Trying to get more motivated.  Depression screen [facility] 2/9 04/07/2017 09/22/2016 06/23/2016 05/19/2016 05/19/2016  Decreased Interest 1 1 - 1 0  Down, Depressed, Hopeless 2 0 - 3 1  PHQ - 2 Score 3 1 - 4 1  Altered sleeping 2 0 1 0 -  Tired, decreased energy 2 0 0 2 -  Change in appetite 3 0 - 1 -  Feeling bad or failure about yourself  1 0 - 2 -  Trouble concentrating 2 0 - 2 -  Moving slowly or fidgety/restless 0 0 - 2 -  Suicidal thoughts 1 0 - 0 -  PHQ-9 Score 14 1 - 13 -   Tobacco Pt is working on quitting smoking through using a "vape."  Hypertension Using medications without difficulty Average home BPs Not checking   No problems or lightheadedness No chest pain with exertion or shortness of breath No Edema  IFG Last Hgb A1C is 5.9 on year ago.  Recheck today  Relevant past medical, surgical, family and social history reviewed and updated as indicated. Interim medical history since our last visit reviewed. Allergies and medications reviewed and updated.  Review of Systems  Per HPI unless specifically indicated above     Objective:    BP 117/77   Pulse 81   Temp 98 F (36.7 C)   Ht 5' 8.1" (1.73 m)   Wt 282 lb (127.9 kg)   SpO2 95%   BMI 42.75 kg/m   Wt Readings from Last 3 Encounters:  04/07/17 282 lb (127.9 kg)  09/22/16 278 lb (126.1 kg)  08/11/16 287 lb (130.2 kg)    Physical Exam  Constitutional: She is oriented to person,  place, and time. She appears well-developed and well-nourished. No distress.  HENT:  Head: Normocephalic and atraumatic.  Eyes: Conjunctivae and lids are normal. Right eye exhibits no discharge. Left eye exhibits no discharge. No scleral icterus.  Neck: Normal range of motion. Neck supple. No JVD present. Carotid bruit is not present.  Cardiovascular: Normal rate, regular rhythm and normal heart sounds.   Pulmonary/Chest: Effort normal and breath sounds normal.  Abdominal: Normal appearance. There is no splenomegaly or hepatomegaly.  Musculoskeletal: Normal range of motion.  Neurological: She is alert and oriented to person, place, and time.  Skin: Skin is warm, dry and intact. No rash noted. No pallor.  Psychiatric: She has a normal mood and affect. Her behavior is normal. Judgment and thought content normal.      Assessment & Plan:   Problem List Items Addressed This Visit      Unprioritized   Benign hypertension    Stable, continue present medications.        Relevant Orders   Comprehensive metabolic panel   Lipid Panel w/o Chol/HDL Ratio  Depression    Having some situational issues.  Will recheck in 3 months and see where she is.  Work on counseling      Hypothyroidism    Check TSH      Relevant Orders   TSH   IFG (impaired fasting glucose)    Check Hgb A1C          Follow up plan: Return in about 3 months (around 07/08/2017) for physical.

## 2017-04-07 NOTE — Assessment & Plan Note (Signed)
Check Hgb A1C

## 2017-04-07 NOTE — Assessment & Plan Note (Signed)
Check TSH

## 2017-04-07 NOTE — Assessment & Plan Note (Addendum)
Having some situational issues.  Will recheck in 3 months and see where she is.  Work on counseling

## 2017-04-07 NOTE — Assessment & Plan Note (Signed)
Stable, continue present medications.   

## 2017-04-08 LAB — COMPREHENSIVE METABOLIC PANEL
ALK PHOS: 127 IU/L — AB (ref 39–117)
ALT: 26 IU/L (ref 0–32)
AST: 32 IU/L (ref 0–40)
Albumin/Globulin Ratio: 1.1 — ABNORMAL LOW (ref 1.2–2.2)
Albumin: 3.9 g/dL (ref 3.5–5.5)
BUN/Creatinine Ratio: 11 (ref 9–23)
BUN: 7 mg/dL (ref 6–24)
Bilirubin Total: <0.2 mg/dL (ref 0.0–1.2)
CALCIUM: 9.3 mg/dL (ref 8.7–10.2)
CO2: 27 mmol/L (ref 18–29)
CREATININE: 0.63 mg/dL (ref 0.57–1.00)
Chloride: 97 mmol/L (ref 96–106)
GFR calc Af Amer: 127 mL/min/{1.73_m2} (ref >59)
GFR calc non Af Amer: 110 mL/min/{1.73_m2} (ref >59)
GLOBULIN, TOTAL: 3.4 g/dL (ref 1.5–4.5)
GLUCOSE: 112 mg/dL — AB (ref 65–99)
Potassium: 4.2 mmol/L (ref 3.5–5.2)
SODIUM: 138 mmol/L (ref 134–144)
Total Protein: 7.3 g/dL (ref 6.0–8.5)

## 2017-04-08 LAB — LIPID PANEL W/O CHOL/HDL RATIO
CHOLESTEROL TOTAL: 174 mg/dL (ref 100–199)
HDL: 42 mg/dL (ref >39)
LDL CALC: 114 mg/dL — AB (ref 0–99)
TRIGLYCERIDES: 88 mg/dL (ref 0–149)
VLDL Cholesterol Cal: 18 mg/dL (ref 5–40)

## 2017-04-08 LAB — TSH: TSH: 4.17 u[IU]/mL (ref 0.450–4.500)

## 2017-04-27 ENCOUNTER — Other Ambulatory Visit: Payer: Self-pay | Admitting: Unknown Physician Specialty

## 2017-06-14 ENCOUNTER — Other Ambulatory Visit: Payer: Self-pay | Admitting: Unknown Physician Specialty

## 2017-06-16 ENCOUNTER — Other Ambulatory Visit: Payer: Self-pay | Admitting: Unknown Physician Specialty

## 2017-06-20 ENCOUNTER — Other Ambulatory Visit: Payer: Self-pay | Admitting: Family Medicine

## 2017-06-22 NOTE — Telephone Encounter (Signed)
Your patient 

## 2017-07-10 ENCOUNTER — Ambulatory Visit (INDEPENDENT_AMBULATORY_CARE_PROVIDER_SITE_OTHER): Payer: BLUE CROSS/BLUE SHIELD | Admitting: Unknown Physician Specialty

## 2017-07-10 ENCOUNTER — Encounter: Payer: Self-pay | Admitting: Unknown Physician Specialty

## 2017-07-10 VITALS — BP 113/78 | HR 88 | Temp 97.6°F | Ht 67.3 in | Wt 289.6 lb

## 2017-07-10 DIAGNOSIS — E038 Other specified hypothyroidism: Secondary | ICD-10-CM | POA: Diagnosis not present

## 2017-07-10 DIAGNOSIS — Z0000 Encounter for general adult medical examination without abnormal findings: Secondary | ICD-10-CM

## 2017-07-10 DIAGNOSIS — Z720 Tobacco use: Secondary | ICD-10-CM

## 2017-07-10 DIAGNOSIS — R600 Localized edema: Secondary | ICD-10-CM

## 2017-07-10 DIAGNOSIS — F320 Major depressive disorder, single episode, mild: Secondary | ICD-10-CM

## 2017-07-10 MED ORDER — ESCITALOPRAM OXALATE 10 MG PO TABS: 10.0000 mg | ORAL_TABLET | Freq: Every day | ORAL | 2 refills | Status: DC

## 2017-07-10 NOTE — Assessment & Plan Note (Signed)
Check thyoid panel

## 2017-07-10 NOTE — Assessment & Plan Note (Signed)
1 plus ankles.  Continue HCTZ

## 2017-07-10 NOTE — Assessment & Plan Note (Signed)
I have recommended absolute tobacco cessation. I have discussed various options available for assistance with tobacco cessation including over the counter methods (Nicotine gum, patch and lozenges). We also discussed prescription options (Chantix, Nicotine Inhaler / Nasal Spray). The patient is not interested in pursuing any prescription tobacco cessation options at this time.

## 2017-07-10 NOTE — Progress Notes (Signed)
BP 113/78   Pulse 88   Temp 97.6 F (36.4 C)   Ht 5' 7.3" (1.709 m)   Wt 289 lb 9.6 oz (131.4 kg)   LMP 07/02/2017 (Exact Date)   SpO2 97%   BMI 44.95 kg/m    Subjective:    Patient ID: Lynn French, female    DOB: October 22, 1971, 46 y.o.   MRN: [id number]  HPI: Lynn French is a 46 y.o. female  Chief Complaint  Patient presents with  . Annual Exam   Depression Pt with a lot of stress.  ADHD son and dealing with child's father. Wonders if Wellbutrin is the right medication.    Depression screen [facility] 2/9 07/10/2017 04/07/2017 09/22/2016 06/23/2016 05/19/2016  Decreased Interest 1 1 1  - 1  Down, Depressed, Hopeless 1 2 0 - 3  PHQ - 2 Score 2 3 1  - 4  Altered sleeping 3 2 0 1 0  Tired, decreased energy 2 2 0 0 2  Change in appetite 2 3 0 - 1  Feeling bad or failure about yourself  0 1 0 - 2  Trouble concentrating 1 2 0 - 2  Moving slowly or fidgety/restless 0 0 0 - 2  Suicidal thoughts 0 1 0 - 0  PHQ-9 Score 10 14 1  - 13   Hypothyoid Doing will with Armour  Obesity Knows she needs to go to the gym  Tobacco Working with smokeless tobacco.  Wanting to wean off.    Nausea Pt with nausea once or twice a day  Relevant past medical, surgical, family and social history reviewed and updated as indicated. Interim medical history since our last visit reviewed. Allergies and medications reviewed and updated.  Review of Systems  HENT: Positive for voice change.     Per HPI unless specifically indicated above     Objective:    BP 113/78   Pulse 88   Temp 97.6 F (36.4 C)   Ht 5' 7.3" (1.709 m)   Wt 289 lb 9.6 oz (131.4 kg)   LMP 07/02/2017 (Exact Date)   SpO2 97%   BMI 44.95 kg/m   Wt Readings from Last 3 Encounters:  07/10/17 289 lb 9.6 oz (131.4 kg)  04/07/17 282 lb (127.9 kg)  09/22/16 278 lb (126.1 kg)    Physical Exam  Constitutional: She is oriented to person, place, and time. She appears well-developed and well-nourished.  HENT:  Head: Normocephalic  and atraumatic.  Eyes: Pupils are equal, round, and reactive to light. Right eye exhibits no discharge. Left eye exhibits no discharge. No scleral icterus.  Neck: Normal range of motion. Neck supple. Carotid bruit is not present. No thyromegaly present.  Cardiovascular: Normal rate, regular rhythm and normal heart sounds.  Exam reveals no gallop and no friction rub.   No murmur heard. Pulmonary/Chest: Effort normal and breath sounds normal. No respiratory distress. She has no wheezes. She has no rales.  Abdominal: Soft. Bowel sounds are normal. There is no tenderness. There is no rebound.  Genitourinary: No breast swelling, tenderness or discharge.  Musculoskeletal: Normal range of motion.       Right ankle: She exhibits swelling.       Left ankle: She exhibits swelling.  Lymphadenopathy:    She has no cervical adenopathy.  Neurological: She is alert and oriented to person, place, and time.  Skin: Skin is warm, dry and intact. No rash noted.  Psychiatric: She has a normal mood and affect. Her speech is normal  and behavior is normal. Judgment and thought content normal. Cognition and memory are normal.    Results for orders placed or performed in visit on 04/07/17  Comprehensive metabolic panel  Result Value Ref Range   Glucose 112 (H) 65 - 99 mg/dL   BUN 7 6 - 24 mg/dL   Creatinine, Ser 0.63 0.57 - 1.00 mg/dL   GFR calc non Af Amer 110 >59 mL/min/1.73   GFR calc Af Amer 127 >59 mL/min/1.73   BUN/Creatinine Ratio 11 9 - 23   Sodium 138 134 - 144 mmol/L   Potassium 4.2 3.5 - 5.2 mmol/L   Chloride 97 96 - 106 mmol/L   CO2 27 18 - 29 mmol/L   Calcium 9.3 8.7 - 10.2 mg/dL   Total Protein 7.3 6.0 - 8.5 g/dL   Albumin 3.9 3.5 - 5.5 g/dL   Globulin, Total 3.4 1.5 - 4.5 g/dL   Albumin/Globulin Ratio 1.1 (L) 1.2 - 2.2   Bilirubin Total <0.2 0.0 - 1.2 mg/dL   Alkaline Phosphatase 127 (H) 39 - 117 IU/L   AST 32 0 - 40 IU/L   ALT 26 0 - 32 IU/L  Lipid Panel w/o Chol/HDL Ratio  Result  Value Ref Range   Cholesterol, Total 174 100 - 199 mg/dL   Triglycerides 88 0 - 149 mg/dL   HDL 42 >39 mg/dL   VLDL Cholesterol Cal 18 5 - 40 mg/dL   LDL Calculated 114 (H) 0 - 99 mg/dL  TSH  Result Value Ref Range   TSH 4.170 0.450 - 4.500 uIU/mL      Assessment & Plan:   Problem List Items Addressed This Visit      Unprioritized   Depression    Start Lexapro.  Wean off of Wellbutrin as pt feels it causes swelling and has not contributed to weight loss      Relevant Medications   escitalopram (LEXAPRO) 10 MG tablet   Edema    1 plus ankles.  Continue HCTZ      Hypothyroidism    Check thyoid panel      Tobacco use     I have recommended absolute tobacco cessation. I have discussed various options available for assistance with tobacco cessation including over the counter methods (Nicotine gum, patch and lozenges). We also discussed prescription options (Chantix, Nicotine Inhaler / Nasal Spray). The patient is not interested in pursuing any prescription tobacco cessation options at this time.        Other Visit Diagnoses    Annual physical exam    -  Primary   Relevant Orders   MM DIGITAL SCREENING BILATERAL       Follow up plan: Return in about 4 weeks (around 08/07/2017).

## 2017-07-10 NOTE — Assessment & Plan Note (Signed)
Work on diet and exercise

## 2017-07-10 NOTE — Assessment & Plan Note (Signed)
Start Lexapro.  Wean off of Wellbutrin as pt feels it causes swelling and has not contributed to weight loss

## 2017-07-10 NOTE — Patient Instructions (Signed)
Psychology today> find a therapist  Please do call to schedule your mammogram; the number to schedule one at either [facility] or [facility] is [phone]

## 2017-07-11 LAB — THYROID PANEL WITH TSH
Free Thyroxine Index: 1.4 (ref 1.2–4.9)
T3 Uptake Ratio: 21 % — ABNORMAL LOW (ref 24–39)
T4, Total: 6.6 ug/dL (ref 4.5–12.0)
TSH: 3.18 u[IU]/mL (ref 0.450–4.500)

## 2017-07-13 NOTE — Progress Notes (Signed)
Normal labs.  Pt notified through mychart.  Recheck in 3-6 months

## 2017-08-07 ENCOUNTER — Ambulatory Visit: Payer: BLUE CROSS/BLUE SHIELD | Admitting: Unknown Physician Specialty

## 2017-08-14 ENCOUNTER — Ambulatory Visit: Payer: BLUE CROSS/BLUE SHIELD | Admitting: Unknown Physician Specialty

## 2017-09-19 ENCOUNTER — Other Ambulatory Visit: Payer: Self-pay | Admitting: Unknown Physician Specialty

## 2017-10-02 ENCOUNTER — Encounter: Payer: Self-pay | Admitting: Unknown Physician Specialty

## 2017-10-02 ENCOUNTER — Ambulatory Visit: Payer: BLUE CROSS/BLUE SHIELD | Admitting: Unknown Physician Specialty

## 2017-10-02 VITALS — BP 134/82 | HR 85 | Temp 98.0°F | Wt 297.4 lb

## 2017-10-02 DIAGNOSIS — F320 Major depressive disorder, single episode, mild: Secondary | ICD-10-CM | POA: Diagnosis not present

## 2017-10-02 DIAGNOSIS — R635 Abnormal weight gain: Secondary | ICD-10-CM | POA: Diagnosis not present

## 2017-10-02 DIAGNOSIS — R5383 Other fatigue: Secondary | ICD-10-CM

## 2017-10-02 DIAGNOSIS — Z23 Encounter for immunization: Secondary | ICD-10-CM | POA: Diagnosis not present

## 2017-10-02 DIAGNOSIS — Z8639 Personal history of other endocrine, nutritional and metabolic disease: Secondary | ICD-10-CM | POA: Diagnosis not present

## 2017-10-02 LAB — BAYER DCA HB A1C WAIVED: HB A1C (BAYER DCA - WAIVED): 5.8 % (ref <7.0)

## 2017-10-02 NOTE — Assessment & Plan Note (Addendum)
Depression not quite to goal.  Fatigue is the major problem.  However a problem before starting Lexapro.  Check labs to see if underlying cause.

## 2017-10-02 NOTE — Progress Notes (Signed)
BP 134/82   Pulse 85   Temp 98 F (36.7 C) (Oral)   Wt 297 lb 6.4 oz (134.9 kg)   SpO2 94%   BMI 46.17 kg/m    Subjective:    Patient ID: Lynn French, female    DOB: July 11, 1971, 46 y.o.   MRN: [id number]  HPI: Lynn French is a 46 y.o. female  Chief Complaint  Patient presents with  . Depression     Last visit switched to Lexapro.  She states she is doing well.  Reviewed the PHQ 9 and does feel tired much of the time.  However, fatigue was a problem before the Lexapro.  States she has a history of a B12 deficiency and states she takes a supplement.  She is frustrated with weight gain and continued smoking.  Feels she needs better habits.    Depression screen [facility] 2/9 10/02/2017 07/10/2017 04/07/2017 09/22/2016 06/23/2016  Decreased Interest 1 1 1 1  -  Down, Depressed, Hopeless 0 1 2 0 -  PHQ - 2 Score 1 2 3 1  -  Altered sleeping 1 3 2  0 1  Tired, decreased energy 2 2 2  0 0  Change in appetite 1 2 3  0 -  Feeling bad or failure about yourself  0 0 1 0 -  Trouble concentrating 2 1 2  0 -  Moving slowly or fidgety/restless 0 0 0 0 -  Suicidal thoughts 0 0 1 0 -  PHQ-9 Score 7 10 14 1  -    Relevant past medical, surgical, family and social history reviewed and updated as indicated. Interim medical history since our last visit reviewed. Allergies and medications reviewed and updated.  Review of Systems  Per HPI unless specifically indicated above     Objective:    BP 134/82   Pulse 85   Temp 98 F (36.7 C) (Oral)   Wt 297 lb 6.4 oz (134.9 kg)   SpO2 94%   BMI 46.17 kg/m   Wt Readings from Last 3 Encounters:  10/02/17 297 lb 6.4 oz (134.9 kg)  07/10/17 289 lb 9.6 oz (131.4 kg)  04/07/17 282 lb (127.9 kg)    Physical Exam  Constitutional: She is oriented to person, place, and time. She appears well-developed and well-nourished. No distress.  HENT:  Head: Normocephalic and atraumatic.  Eyes: Conjunctivae and lids are normal. Right eye exhibits no discharge.  Left eye exhibits no discharge. No scleral icterus.  Neck: Normal range of motion. Neck supple. No JVD present. Carotid bruit is not present.  Cardiovascular: Normal rate, regular rhythm and normal heart sounds.  Pulmonary/Chest: Effort normal and breath sounds normal.  Abdominal: Normal appearance. There is no splenomegaly or hepatomegaly.  Musculoskeletal: Normal range of motion.  Neurological: She is alert and oriented to person, place, and time.  Skin: Skin is warm, dry and intact. No rash noted. No pallor.  Psychiatric: She has a normal mood and affect. Her behavior is normal. Judgment and thought content normal.    Results for orders placed or performed in visit on 07/10/17  Thyroid Panel With TSH  Result Value Ref Range   TSH 3.180 0.450 - 4.500 uIU/mL   T4, Total 6.6 4.5 - 12.0 ug/dL   T3 Uptake Ratio 21 (L) 24 - 39 %   Free Thyroxine Index 1.4 1.2 - 4.9      Assessment & Plan:   Problem List Items Addressed This Visit      Unprioritized   Depression  Depression not quite to goal.  Fatigue is the major problem.  However a problem before starting Lexapro.  Check labs to see if underlying cause.        History of non anemic vitamin B12 deficiency   Morbid obesity ([place])   Relevant Orders   Bayer DCA Hb A1c Waived    Other Visit Diagnoses    Need for influenza vaccination    -  Primary   Relevant Orders   Flu Vaccine QUAD 36+ mos IM (Completed)   Abnormal weight gain       States she doesn't eat much but does drink soft drinks.  Counseled to avoid suar and processed food   Relevant Orders   Bayer DCA Hb A1c Waived   Fatigue, unspecified type       Relevant Orders   Thyroid Panel With TSH   Bayer DCA Hb A1c Waived   Comprehensive metabolic panel   CBC with Differential/Platelet   VITAMIN D 25 Hydroxy (Vit-D Deficiency, Fractures)       Follow up plan: Return in about 3 months (around 12/31/2017).

## 2017-10-02 NOTE — Patient Instructions (Signed)

## 2017-10-03 LAB — CBC WITH DIFFERENTIAL/PLATELET
Basophils Absolute: 0 10*3/uL (ref 0.0–0.2)
Basos: 0 %
EOS (ABSOLUTE): 0.4 10*3/uL (ref 0.0–0.4)
EOS: 6 %
HEMATOCRIT: 34.5 % (ref 34.0–46.6)
HEMOGLOBIN: 10.9 g/dL — AB (ref 11.1–15.9)
IMMATURE GRANS (ABS): 0 10*3/uL (ref 0.0–0.1)
IMMATURE GRANULOCYTES: 0 %
LYMPHS ABS: 1.5 10*3/uL (ref 0.7–3.1)
LYMPHS: 25 %
MCH: 26.2 pg — ABNORMAL LOW (ref 26.6–33.0)
MCHC: 31.6 g/dL (ref 31.5–35.7)
MCV: 83 fL (ref 79–97)
MONOCYTES: 6 %
Monocytes Absolute: 0.3 10*3/uL (ref 0.1–0.9)
Neutrophils Absolute: 3.8 10*3/uL (ref 1.4–7.0)
Neutrophils: 63 %
Platelets: 286 10*3/uL (ref 150–379)
RBC: 4.16 x10E6/uL (ref 3.77–5.28)
RDW: 16.5 % — ABNORMAL HIGH (ref 12.3–15.4)
WBC: 6.1 10*3/uL (ref 3.4–10.8)

## 2017-10-03 LAB — COMPREHENSIVE METABOLIC PANEL
ALBUMIN: 3.4 g/dL — AB (ref 3.5–5.5)
ALK PHOS: 99 IU/L (ref 39–117)
ALT: 27 IU/L (ref 0–32)
AST: 41 IU/L — ABNORMAL HIGH (ref 0–40)
Albumin/Globulin Ratio: 1.2 (ref 1.2–2.2)
BUN / CREAT RATIO: 13 (ref 9–23)
BUN: 11 mg/dL (ref 6–24)
Bilirubin Total: 0.2 mg/dL (ref 0.0–1.2)
CO2: 24 mmol/L (ref 20–29)
CREATININE: 0.84 mg/dL (ref 0.57–1.00)
Calcium: 8.7 mg/dL (ref 8.7–10.2)
Chloride: 103 mmol/L (ref 96–106)
GFR calc Af Amer: 98 mL/min/{1.73_m2} (ref >59)
GFR calc non Af Amer: 85 mL/min/{1.73_m2} (ref >59)
GLUCOSE: 142 mg/dL — AB (ref 65–99)
Globulin, Total: 2.9 g/dL (ref 1.5–4.5)
Potassium: 4.9 mmol/L (ref 3.5–5.2)
Sodium: 139 mmol/L (ref 134–144)
TOTAL PROTEIN: 6.3 g/dL (ref 6.0–8.5)

## 2017-10-03 LAB — THYROID PANEL WITH TSH
FREE THYROXINE INDEX: 1.1 — AB (ref 1.2–4.9)
T3 Uptake Ratio: 19 % — ABNORMAL LOW (ref 24–39)
T4 TOTAL: 5.6 ug/dL (ref 4.5–12.0)
TSH: 1.56 u[IU]/mL (ref 0.450–4.500)

## 2017-10-03 LAB — VITAMIN D 25 HYDROXY (VIT D DEFICIENCY, FRACTURES): Vit D, 25-Hydroxy: 25 ng/mL — ABNORMAL LOW (ref 30.0–100.0)

## 2017-10-05 ENCOUNTER — Telehealth: Payer: Self-pay | Admitting: Unknown Physician Specialty

## 2017-10-05 NOTE — Telephone Encounter (Signed)
Pt notified on mychart

## 2017-10-14 ENCOUNTER — Other Ambulatory Visit: Payer: Self-pay | Admitting: Unknown Physician Specialty

## 2017-10-14 NOTE — Telephone Encounter (Signed)
Copied from [place]. Topic: Quick Communication - See Telephone Encounter >> Oct 14, 2017  2:38 PM Corie Chiquito, [place] wrote: CRM for notification. See Telephone encounter for: Patient needs a refill on her Escitalopram. Patient has reached out to her pharmacy as well. If someone could please give her a call back about this at [phone]  10/14/17.

## 2017-10-15 NOTE — Telephone Encounter (Signed)
Left message letting pt know medication was refilled at requested pharmacy.

## 2018-01-04 ENCOUNTER — Ambulatory Visit (INDEPENDENT_AMBULATORY_CARE_PROVIDER_SITE_OTHER): Payer: BLUE CROSS/BLUE SHIELD | Admitting: Unknown Physician Specialty

## 2018-01-04 ENCOUNTER — Encounter: Payer: Self-pay | Admitting: Unknown Physician Specialty

## 2018-01-04 DIAGNOSIS — I10 Essential (primary) hypertension: Secondary | ICD-10-CM

## 2018-01-04 DIAGNOSIS — F320 Major depressive disorder, single episode, mild: Secondary | ICD-10-CM | POA: Diagnosis not present

## 2018-01-04 NOTE — Assessment & Plan Note (Addendum)
Discussed changing eating habits.  Planning on joining the gym.  Discussed sugar and processed food elimination

## 2018-01-04 NOTE — Assessment & Plan Note (Signed)
Stable, continue present medications.   

## 2018-01-04 NOTE — Progress Notes (Signed)
BP 122/84   Pulse 73   Temp 98 F (36.7 C) (Oral)   Ht 5' 7.2" (1.707 m)   Wt 299 lb 3.2 oz (135.7 kg)   SpO2 95%   BMI 46.58 kg/m    Subjective:    Patient ID: Lynn French, female    DOB: 04/09/1971, 47 y.o.   MRN: [id number]  HPI: Lynn French is a 47 y.o. female  Chief Complaint  Patient presents with  . Depression   Depression Pt states she doesn't feel her depression is as bad.  She reports lack of motivation as her biggest problem.  States her energy level is low.  Having family stressors Depression screen [facility] 2/9 01/04/2018 10/02/2017 07/10/2017 04/07/2017 09/22/2016  Decreased Interest 3 1 1 1 1   Down, Depressed, Hopeless 0 0 1 2 0  PHQ - 2 Score 3 1 2 3 1   Altered sleeping 1 1 3 2  0  Tired, decreased energy 2 2 2 2  0  Change in appetite 2 1 2 3  0  Feeling bad or failure about yourself  0 0 0 1 0  Trouble concentrating 1 2 1 2  0  Moving slowly or fidgety/restless 0 0 0 0 0  Suicidal thoughts 0 0 0 1 0  PHQ-9 Score 9 7 10 14 1     Obesity Frustrated with weight gain getting worse.  Pt states she is planning on working on not eating late at night and planning on joining the gym.    Hypertension Using medications without difficulty Average home BPs Not checking   No problems or lightheadedness No chest pain with exertion or shortness of breath No Edema    Family History  Problem Relation Age of Onset  . Mental illness Mother   . Cancer Maternal Grandmother        breast  . Allergies Son   . Eczema Son    Social History   Socioeconomic History  . Marital status: Single    Spouse name: Not on file  . Number of children: Not on file  . Years of education: Not on file  . Highest education level: Not on file  Social Needs  . Financial resource strain: Not on file  . Food insecurity - worry: Not on file  . Food insecurity - inability: Not on file  . Transportation needs - medical: Not on file  . Transportation needs - non-medical: Not on file    Occupational History  . Not on file  Tobacco Use  . Smoking status: Current Every Day Smoker    Packs/day: 0.50    Years: 20.00    Pack years: 10.00    Types: Cigarettes  . Smokeless tobacco: Never Used  Substance and Sexual Activity  . Alcohol use: No    Alcohol/week: 0.0 oz    Comment: very rare  . Drug use: No  . Sexual activity: Not Currently  Other Topics Concern  . Not on file  Social History Narrative  . Not on file     Relevant past medical, surgical, family and social history reviewed and updated as indicated. Interim medical history since our last visit reviewed. Allergies and medications reviewed and updated.  Review of Systems  Respiratory: Negative.   Cardiovascular: Negative.   Gastrointestinal: Negative.   Genitourinary: Negative.   Musculoskeletal: Negative.     Per HPI unless specifically indicated above     Objective:    BP 122/84   Pulse 73   Temp 98  F (36.7 C) (Oral)   Ht 5' 7.2" (1.707 m)   Wt 299 lb 3.2 oz (135.7 kg)   SpO2 95%   BMI 46.58 kg/m   Wt Readings from Last 3 Encounters:  01/04/18 299 lb 3.2 oz (135.7 kg)  10/02/17 297 lb 6.4 oz (134.9 kg)  07/10/17 289 lb 9.6 oz (131.4 kg)    Physical Exam  Constitutional: She is oriented to person, place, and time. She appears well-developed and well-nourished. No distress.  HENT:  Head: Normocephalic and atraumatic.  Eyes: Conjunctivae and lids are normal. Right eye exhibits no discharge. Left eye exhibits no discharge. No scleral icterus.  Neck: Normal range of motion. Neck supple. No JVD present. Carotid bruit is not present.  Cardiovascular: Normal rate, regular rhythm and normal heart sounds.  Pulmonary/Chest: Effort normal and breath sounds normal.  Abdominal: Normal appearance. There is no splenomegaly or hepatomegaly.  Musculoskeletal: Normal range of motion.  Neurological: She is alert and oriented to person, place, and time.  Skin: Skin is warm, dry and intact. No rash  noted. No pallor.  Psychiatric: She has a normal mood and affect. Her behavior is normal. Judgment and thought content normal.    Results for orders placed or performed in visit on 10/02/17  Thyroid Panel With TSH  Result Value Ref Range   TSH 1.560 0.450 - 4.500 uIU/mL   T4, Total 5.6 4.5 - 12.0 ug/dL   T3 Uptake Ratio 19 (L) 24 - 39 %   Free Thyroxine Index 1.1 (L) 1.2 - 4.9  Bayer DCA Hb A1c Waived  Result Value Ref Range   Bayer DCA Hb A1c Waived 5.8 <7.0 %  Comprehensive metabolic panel  Result Value Ref Range   Glucose 142 (H) 65 - 99 mg/dL   BUN 11 6 - 24 mg/dL   Creatinine, Ser 0.84 0.57 - 1.00 mg/dL   GFR calc non Af Amer 85 >59 mL/min/1.73   GFR calc Af Amer 98 >59 mL/min/1.73   BUN/Creatinine Ratio 13 9 - 23   Sodium 139 134 - 144 mmol/L   Potassium 4.9 3.5 - 5.2 mmol/L   Chloride 103 96 - 106 mmol/L   CO2 24 20 - 29 mmol/L   Calcium 8.7 8.7 - 10.2 mg/dL   Total Protein 6.3 6.0 - 8.5 g/dL   Albumin 3.4 (L) 3.5 - 5.5 g/dL   Globulin, Total 2.9 1.5 - 4.5 g/dL   Albumin/Globulin Ratio 1.2 1.2 - 2.2   Bilirubin Total 0.2 0.0 - 1.2 mg/dL   Alkaline Phosphatase 99 39 - 117 IU/L   AST 41 (H) 0 - 40 IU/L   ALT 27 0 - 32 IU/L  CBC with Differential/Platelet  Result Value Ref Range   WBC 6.1 3.4 - 10.8 x10E3/uL   RBC 4.16 3.77 - 5.28 x10E6/uL   Hemoglobin 10.9 (L) 11.1 - 15.9 g/dL   Hematocrit 34.5 34.0 - 46.6 %   MCV 83 79 - 97 fL   MCH 26.2 (L) 26.6 - 33.0 pg   MCHC 31.6 31.5 - 35.7 g/dL   RDW 16.5 (H) 12.3 - 15.4 %   Platelets 286 150 - 379 x10E3/uL   Neutrophils 63 Not Estab. %   Lymphs 25 Not Estab. %   Monocytes 6 Not Estab. %   Eos 6 Not Estab. %   Basos 0 Not Estab. %   Neutrophils Absolute 3.8 1.4 - 7.0 x10E3/uL   Lymphocytes Absolute 1.5 0.7 - 3.1 x10E3/uL   Monocytes Absolute 0.3  0.1 - 0.9 x10E3/uL   EOS (ABSOLUTE) 0.4 0.0 - 0.4 x10E3/uL   Basophils Absolute 0.0 0.0 - 0.2 x10E3/uL   Immature Granulocytes 0 Not Estab. %   Immature Grans (Abs) 0.0  0.0 - 0.1 x10E3/uL  VITAMIN D 25 Hydroxy (Vit-D Deficiency, Fractures)  Result Value Ref Range   Vit D, 25-Hydroxy 25.0 (L) 30.0 - 100.0 ng/mL      Assessment & Plan:   Problem List Items Addressed This Visit      Unprioritized   Benign hypertension    Stable, continue present medications.        Depression    Not to goal.  In partial remission.  No change in medications at this time.  Discussed exercise for added benefit.        Morbid obesity ([place])    Discussed changing eating habits.  Planning on joining the gym.  Discussed sugar and processed food elimination          Follow up plan: Return in about 3 months (around 04/06/2018).

## 2018-01-04 NOTE — Assessment & Plan Note (Signed)
Not to goal.  In partial remission.  No change in medications at this time.  Discussed exercise for added benefit.

## 2018-01-11 ENCOUNTER — Other Ambulatory Visit: Payer: Self-pay | Admitting: Unknown Physician Specialty

## 2018-01-28 ENCOUNTER — Other Ambulatory Visit: Payer: Self-pay | Admitting: Unknown Physician Specialty

## 2018-01-28 NOTE — Telephone Encounter (Signed)
[facility] Thyroid refill Last OV: 04/07/17 has upcoming appt 04/12/18 Last Refill:04/28/17 #30 8 RF Pharmacy:[facility] [address] PCP: Kathrine Haddock NP Last TSH 10/02/17 1.560

## 2018-02-25 ENCOUNTER — Other Ambulatory Visit: Payer: Self-pay | Admitting: Unknown Physician Specialty

## 2018-03-05 ENCOUNTER — Telehealth: Payer: Self-pay

## 2018-03-05 MED ORDER — VALACYCLOVIR HCL 1 G PO TABS: 1000.0000 mg | ORAL_TABLET | Freq: Two times a day (BID) | ORAL | 0 refills | Status: DC

## 2018-03-05 NOTE — Telephone Encounter (Signed)
Copied from [place] [id number]. Topic: Quick Communication - See Telephone Encounter >> Mar 05, 2018 12:53 PM Hewitt Shorts wrote: Pt believes that she is having a break out of genital  herpes and would lilke something called in     [phone] best number ok to leave a message   [facility]    Routing to provider.

## 2018-03-05 NOTE — Telephone Encounter (Signed)
Rx sent to her pharmacy

## 2018-03-05 NOTE — Telephone Encounter (Signed)
Patient notified about medication being sent in.

## 2018-03-08 MED ORDER — VALACYCLOVIR HCL 500 MG PO TABS: 500.0000 mg | ORAL_TABLET | Freq: Two times a day (BID) | ORAL | 0 refills | Status: DC

## 2018-03-08 NOTE — Addendum Note (Signed)
Addended by: Kathrine Haddock on: 03/08/2018 02:51 PM   Modules accepted: Orders

## 2018-03-30 ENCOUNTER — Other Ambulatory Visit: Payer: Self-pay | Admitting: Family Medicine

## 2018-04-01 NOTE — Telephone Encounter (Signed)
Refill Armour Thyroid 120 MG tab Last Refill:02/24/18 # 0 Last OV: 01/04/18 PCP: Collene Schlichter, NP Pharmacy:[facility] [phone]

## 2018-04-11 ENCOUNTER — Other Ambulatory Visit: Payer: Self-pay | Admitting: Family Medicine

## 2018-04-12 ENCOUNTER — Ambulatory Visit: Payer: BLUE CROSS/BLUE SHIELD | Admitting: Unknown Physician Specialty

## 2018-04-26 ENCOUNTER — Encounter: Payer: Self-pay | Admitting: Physician Assistant

## 2018-04-26 ENCOUNTER — Ambulatory Visit: Payer: BLUE CROSS/BLUE SHIELD | Admitting: Physician Assistant

## 2018-04-26 VITALS — BP 127/85 | HR 85 | Temp 98.1°F | Ht 67.0 in | Wt 296.8 lb

## 2018-04-26 DIAGNOSIS — M25561 Pain in right knee: Secondary | ICD-10-CM | POA: Diagnosis not present

## 2018-04-26 MED ORDER — MELOXICAM 7.5 MG PO TABS: ORAL_TABLET | ORAL | 0 refills | Status: DC

## 2018-04-26 NOTE — Patient Instructions (Addendum)
[place], [place] [id number]   Knee Pain, Adult Many things can cause knee pain. The pain often goes away on its own with time and rest. If the pain does not go away, tests may be done to find out what is causing the pain. Follow these instructions at home: Activity  Rest your knee.  Do not do things that cause pain.  Avoid activities where both feet leave the ground at the same time (high-impact activities). Examples are running, jumping rope, and doing jumping jacks. General instructions  Take medicines only as told by your doctor.  Raise (elevate) your knee when you are resting. Make sure your knee is higher than your heart.  Sleep with a pillow under your knee.  If told, put ice on the knee: ? Put ice in a plastic bag. ? Place a towel between your skin and the bag. ? Leave the ice on for 20 minutes, 2-3 times a day.  Ask your doctor if you should wear an elastic knee support.  Lose weight if you are overweight. Being overweight can make your knee hurt more.  Do not use any tobacco products. These include cigarettes, chewing tobacco, or electronic cigarettes. If you need help quitting, ask your doctor. Smoking may slow down healing. Contact a doctor if:  The pain does not stop.  The pain changes or gets worse.  You have a fever along with knee pain.  Your knee gives out or locks up.  Your knee swells, and becomes worse. Get help right away if:  Your knee feels warm.  You cannot move your knee.  You have very bad knee pain.  You have chest pain.  You have trouble breathing. Summary  Many things can cause knee pain. The pain often goes away on its own with time and rest.  Avoid activities that put stress on your knee. These include running and jumping rope.  Get help right away if you cannot move your knee, or if your knee feels warm, or if you have trouble breathing. This information is not intended to replace advice given to you by your health care  provider. Make sure you discuss any questions you have with your health care provider. Document Released: 01/16/2009 Document Revised: 10/14/2016 Document Reviewed: 10/14/2016 Elsevier Interactive Patient Education  2017 [employer].

## 2018-04-26 NOTE — Progress Notes (Signed)
Subjective:    Patient ID: Lynn French, female    DOB: 03-Sep-1971, 47 y.o.   MRN: [id number]  Lynn French is a 47 y.o. female presenting on 04/26/2018 for Knee Pain (Right knee, has been hurting since last Sunday. Does not remember injuring the knee and has been getting worse)   HPI   Started out of the blue. No injuries. No previous surgeries. Right knee pain that worsens with movement.   Social History   Tobacco Use  . Smoking status: Current Every Day Smoker    Packs/day: 0.50    Years: 20.00    Pack years: 10.00    Types: Cigarettes  . Smokeless tobacco: Never Used  Substance Use Topics  . Alcohol use: No    Alcohol/week: 0.0 oz    Comment: very rare  . Drug use: No    Review of Systems Per HPI unless specifically indicated above     Objective:    BP 127/85   Pulse 85   Temp 98.1 F (36.7 C) (Oral)   Ht 5' 7"  (1.702 m)   Wt 296 lb 12.8 oz (134.6 kg)   LMP 04/15/2018 (Approximate)   SpO2 96%   BMI 46.49 kg/m   Wt Readings from Last 3 Encounters:  04/29/18 295 lb 4.8 oz (133.9 kg)  04/26/18 296 lb 12.8 oz (134.6 kg)  01/04/18 299 lb 3.2 oz (135.7 kg)    Physical Exam  Constitutional: She is oriented to person, place, and time. She appears well-developed and well-nourished.  Cardiovascular: Normal rate and regular rhythm.  Pulmonary/Chest: Effort normal and breath sounds normal.  Musculoskeletal:       Right knee: Normal. She exhibits normal range of motion, no swelling, no effusion, no ecchymosis, no deformity, no laceration, no erythema, normal alignment, no LCL laxity, normal patellar mobility, no bony tenderness, normal meniscus and no MCL laxity.  Neurological: She is alert and oriented to person, place, and time.  Skin: Skin is warm and dry.  Psychiatric: She has a normal mood and affect. Her behavior is normal.   Results for orders placed or performed in visit on 10/02/17  Thyroid Panel With TSH  Result Value Ref Range   TSH 1.560 0.450 -  4.500 uIU/mL   T4, Total 5.6 4.5 - 12.0 ug/dL   T3 Uptake Ratio 19 (L) 24 - 39 %   Free Thyroxine Index 1.1 (L) 1.2 - 4.9  Bayer DCA Hb A1c Waived  Result Value Ref Range   HB A1C (BAYER DCA - WAIVED) 5.8 <7.0 %  Comprehensive metabolic panel  Result Value Ref Range   Glucose 142 (H) 65 - 99 mg/dL   BUN 11 6 - 24 mg/dL   Creatinine, Ser 0.84 0.57 - 1.00 mg/dL   GFR calc non Af Amer 85 >59 mL/min/1.73   GFR calc Af Amer 98 >59 mL/min/1.73   BUN/Creatinine Ratio 13 9 - 23   Sodium 139 134 - 144 mmol/L   Potassium 4.9 3.5 - 5.2 mmol/L   Chloride 103 96 - 106 mmol/L   CO2 24 20 - 29 mmol/L   Calcium 8.7 8.7 - 10.2 mg/dL   Total Protein 6.3 6.0 - 8.5 g/dL   Albumin 3.4 (L) 3.5 - 5.5 g/dL   Globulin, Total 2.9 1.5 - 4.5 g/dL   Albumin/Globulin Ratio 1.2 1.2 - 2.2   Bilirubin Total 0.2 0.0 - 1.2 mg/dL   Alkaline Phosphatase 99 39 - 117 IU/L   AST 41 (H) 0 -  40 IU/L   ALT 27 0 - 32 IU/L  CBC with Differential/Platelet  Result Value Ref Range   WBC 6.1 3.4 - 10.8 x10E3/uL   RBC 4.16 3.77 - 5.28 x10E6/uL   Hemoglobin 10.9 (L) 11.1 - 15.9 g/dL   Hematocrit 34.5 34.0 - 46.6 %   MCV 83 79 - 97 fL   MCH 26.2 (L) 26.6 - 33.0 pg   MCHC 31.6 31.5 - 35.7 g/dL   RDW 16.5 (H) 12.3 - 15.4 %   Platelets 286 150 - 379 x10E3/uL   Neutrophils 63 Not Estab. %   Lymphs 25 Not Estab. %   Monocytes 6 Not Estab. %   Eos 6 Not Estab. %   Basos 0 Not Estab. %   Neutrophils Absolute 3.8 1.4 - 7.0 x10E3/uL   Lymphocytes Absolute 1.5 0.7 - 3.1 x10E3/uL   Monocytes Absolute 0.3 0.1 - 0.9 x10E3/uL   EOS (ABSOLUTE) 0.4 0.0 - 0.4 x10E3/uL   Basophils Absolute 0.0 0.0 - 0.2 x10E3/uL   Immature Granulocytes 0 Not Estab. %   Immature Grans (Abs) 0.0 0.0 - 0.1 x10E3/uL  VITAMIN D 25 Hydroxy (Vit-D Deficiency, Fractures)  Result Value Ref Range   Vit D, 25-Hydroxy 25.0 (L) 30.0 - 100.0 ng/mL      Assessment & Plan:  1. Acute pain of right knee  - DG Knee 4 Views W/Patella Right; Future - DG Knee  Bilateral Standing AP; Future - meloxicam (MOBIC) 7.5 MG tablet; Take 1 tab in the morning and 1 at night if needed. Absolutely no more than two pills.  Dispense: 30 tablet; Refill: 0    Follow up plan: Return if symptoms worsen or fail to improve.  Carles Collet, PA-C [facility] 05/07/2018, 1:00 PM

## 2018-04-27 ENCOUNTER — Ambulatory Visit
Admission: RE | Admit: 2018-04-27 | Discharge: 2018-04-27 | Disposition: A | Discharge status: Home / Self Care | Payer: BLUE CROSS/BLUE SHIELD | Source: Ambulatory Visit | Attending: Physician Assistant | Admitting: Physician Assistant

## 2018-04-27 DIAGNOSIS — M25561 Pain in right knee: Secondary | ICD-10-CM | POA: Diagnosis present

## 2018-04-27 DIAGNOSIS — M25461 Effusion, right knee: Secondary | ICD-10-CM | POA: Insufficient documentation

## 2018-04-27 DIAGNOSIS — M25761 Osteophyte, right knee: Secondary | ICD-10-CM | POA: Diagnosis not present

## 2018-04-28 ENCOUNTER — Telehealth: Payer: Self-pay | Admitting: Physician Assistant

## 2018-04-28 NOTE — Telephone Encounter (Signed)
We didn't discuss missing work at all? Why does she need to be out two weeks? She will need a visit to discuss this. She can have a work note with today's date only for acute pain but not writing for two weeks without further discussion as to why.

## 2018-04-28 NOTE — Telephone Encounter (Signed)
Patient states she needs a letter stating she needs to be out of work for 2 weeks starting today. Patient states she is in jeopardy of losing her job.  Can I type her 2 notes starting with today's date putting her out of work or do you want her to come in tomorrow for an appointment to discuss the results of xray  Thank you   Naudia [phone]

## 2018-04-28 NOTE — Telephone Encounter (Signed)
Scheduled patient with Adriana for 6/27 @ 9 to discuss severe knee pain

## 2018-04-29 ENCOUNTER — Encounter: Payer: Self-pay | Admitting: Physician Assistant

## 2018-04-29 ENCOUNTER — Ambulatory Visit: Payer: BLUE CROSS/BLUE SHIELD | Admitting: Physician Assistant

## 2018-04-29 VITALS — BP 111/74 | HR 86 | Temp 97.7°F | Wt 295.3 lb

## 2018-04-29 DIAGNOSIS — M1711 Unilateral primary osteoarthritis, right knee: Secondary | ICD-10-CM

## 2018-04-29 DIAGNOSIS — M25561 Pain in right knee: Secondary | ICD-10-CM

## 2018-04-29 NOTE — Progress Notes (Signed)
Subjective:    Patient ID: Lynn French, female    DOB: Dec 29, 1970, 47 y.o.   MRN: [id number]  Lynn French is a 47 y.o. female presenting on 04/29/2018 for Knee Pain (Right, ongoing for 2 weeks. No specific trigger to injur knee.)   HPI  Presents today for follow up of right knee pain. Started two weeks ago with no memorable trigger. Hurts all the time. Patient reports she is now walking with a cane. She reports all motion makes knee worse. Was given meloxicam which has helped somewhat. Xrays of right knee show mild-moderate arthritic changes and small suprapatellar effusion.   Patient is requesting a week off of work to allow her knee time to heal or she will lose her job. She works at [employer] and [employer].  Social History   Tobacco Use  . Smoking status: Current Every Day Smoker    Packs/day: 0.50    Years: 20.00    Pack years: 10.00    Types: Cigarettes  . Smokeless tobacco: Never Used  Substance Use Topics  . Alcohol use: No    Alcohol/week: 0.0 oz    Comment: very rare  . Drug use: No    Review of Systems Per HPI unless specifically indicated above     Objective:    BP 111/74 (BP Location: Left Arm, Patient Position: Sitting, Cuff Size: Normal)   Pulse 86   Temp 97.7 F (36.5 C) (Oral)   Wt 295 lb 4.8 oz (133.9 kg)   LMP 04/15/2018 (Approximate)   SpO2 97%   BMI 46.25 kg/m   Wt Readings from Last 3 Encounters:  04/29/18 295 lb 4.8 oz (133.9 kg)  04/26/18 296 lb 12.8 oz (134.6 kg)  01/04/18 299 lb 3.2 oz (135.7 kg)    Physical Exam  Constitutional: She is oriented to person, place, and time. She appears well-developed and well-nourished.  Musculoskeletal: Normal range of motion. She exhibits tenderness. She exhibits no edema or deformity.       Right knee: She exhibits normal range of motion, no swelling, no effusion, no ecchymosis, no deformity, no laceration, no erythema, normal alignment, no LCL laxity, no bony tenderness, normal meniscus and no  MCL laxity. Tenderness found.  Does not show any signs of MCL or LCL laxity on examination. No signs of anterior or posterior instability.   Neurological: She is alert and oriented to person, place, and time.  Skin: Skin is warm and dry.  Psychiatric: She has a normal mood and affect. Her behavior is normal.   Results for orders placed or performed in visit on 10/02/17  Thyroid Panel With TSH  Result Value Ref Range   TSH 1.560 0.450 - 4.500 uIU/mL   T4, Total 5.6 4.5 - 12.0 ug/dL   T3 Uptake Ratio 19 (L) 24 - 39 %   Free Thyroxine Index 1.1 (L) 1.2 - 4.9  Bayer DCA Hb A1c Waived  Result Value Ref Range   HB A1C (BAYER DCA - WAIVED) 5.8 <7.0 %  Comprehensive metabolic panel  Result Value Ref Range   Glucose 142 (H) 65 - 99 mg/dL   BUN 11 6 - 24 mg/dL   Creatinine, Ser 0.84 0.57 - 1.00 mg/dL   GFR calc non Af Amer 85 >59 mL/min/1.73   GFR calc Af Amer 98 >59 mL/min/1.73   BUN/Creatinine Ratio 13 9 - 23   Sodium 139 134 - 144 mmol/L   Potassium 4.9 3.5 - 5.2 mmol/L   Chloride 103  96 - 106 mmol/L   CO2 24 20 - 29 mmol/L   Calcium 8.7 8.7 - 10.2 mg/dL   Total Protein 6.3 6.0 - 8.5 g/dL   Albumin 3.4 (L) 3.5 - 5.5 g/dL   Globulin, Total 2.9 1.5 - 4.5 g/dL   Albumin/Globulin Ratio 1.2 1.2 - 2.2   Bilirubin Total 0.2 0.0 - 1.2 mg/dL   Alkaline Phosphatase 99 39 - 117 IU/L   AST 41 (H) 0 - 40 IU/L   ALT 27 0 - 32 IU/L  CBC with Differential/Platelet  Result Value Ref Range   WBC 6.1 3.4 - 10.8 x10E3/uL   RBC 4.16 3.77 - 5.28 x10E6/uL   Hemoglobin 10.9 (L) 11.1 - 15.9 g/dL   Hematocrit 34.5 34.0 - 46.6 %   MCV 83 79 - 97 fL   MCH 26.2 (L) 26.6 - 33.0 pg   MCHC 31.6 31.5 - 35.7 g/dL   RDW 16.5 (H) 12.3 - 15.4 %   Platelets 286 150 - 379 x10E3/uL   Neutrophils 63 Not Estab. %   Lymphs 25 Not Estab. %   Monocytes 6 Not Estab. %   Eos 6 Not Estab. %   Basos 0 Not Estab. %   Neutrophils Absolute 3.8 1.4 - 7.0 x10E3/uL   Lymphocytes Absolute 1.5 0.7 - 3.1 x10E3/uL   Monocytes  Absolute 0.3 0.1 - 0.9 x10E3/uL   EOS (ABSOLUTE) 0.4 0.0 - 0.4 x10E3/uL   Basophils Absolute 0.0 0.0 - 0.2 x10E3/uL   Immature Granulocytes 0 Not Estab. %   Immature Grans (Abs) 0.0 0.0 - 0.1 x10E3/uL  VITAMIN D 25 Hydroxy (Vit-D Deficiency, Fractures)  Result Value Ref Range   Vit D, 25-Hydroxy 25.0 (L) 30.0 - 100.0 ng/mL      Assessment & Plan:   1. Acute pain of right knee  Continue meloxicam. Xray shows mild-moderate arthritis.Do not suspect soft tissue injury, no mechanism or exam finding. She is to call back next week with update. If she is doing worse, will refer to orthopedics. Will write work note for a week but after that she will need to be evaluated by specialist for any further time out of work.  2. Osteoarthritis of right knee, unspecified osteoarthritis type  I have spent 25 minutes with this patient on counseling and coordination of care.    Follow up plan: Return if symptoms worsen or fail to improve.  Carles Collet, PA-C [facility] 04/29/2018, 12:16 PM

## 2018-04-29 NOTE — Patient Instructions (Signed)
Arthritis Arthritis means joint pain. It can also mean joint disease. A joint is a place where bones come together. People who have arthritis may have:  Red joints.  Swollen joints.  Stiff joints.  Warm joints.  A fever.  A feeling of being sick.  Follow these instructions at home: Pay attention to any changes in your symptoms. Take these actions to help with your pain and swelling. Medicines  Take over-the-counter and prescription medicines only as told by your doctor.  Do not take aspirin for pain if your doctor says that you may have gout. Activity  Rest your joint if your doctor tells you to.  Avoid activities that make the pain worse.  Exercise your joint regularly as told by your doctor. Try doing exercises like: ? Swimming. ? Water aerobics. ? Biking. ? Walking. Joint Care   If your joint is swollen, keep it raised (elevated) if told by your doctor.  If your joint feels stiff in the morning, try taking a warm shower.  If you have diabetes, do not apply heat without asking your doctor.  If told, apply heat to the joint: ? Put a towel between the joint and the hot pack or heating pad. ? Leave the heat on the area for 20-30 minutes.  If told, apply ice to the joint: ? Put ice in a plastic bag. ? Place a towel between your skin and the bag. ? Leave the ice on for 20 minutes, 2-3 times per day.  Keep all follow-up visits as told by your doctor. Contact a doctor if:  The pain gets worse.  You have a fever. Get help right away if:  You have very bad pain in your joint.  You have swelling in your joint.  Your joint is red.  Many joints become painful and swollen.  You have very bad back pain.  Your leg is very weak.  You cannot control your pee (urine) or poop (stool). This information is not intended to replace advice given to you by your health care provider. Make sure you discuss any questions you have with your health care provider. Document  Released: 01/14/2010 Document Revised: 03/27/2016 Document Reviewed: 01/15/2015 Elsevier Interactive Patient Education  [employer].

## 2018-04-30 ENCOUNTER — Other Ambulatory Visit: Payer: Self-pay | Admitting: Unknown Physician Specialty

## 2018-05-02 ENCOUNTER — Other Ambulatory Visit: Payer: Self-pay | Admitting: Family Medicine

## 2018-05-03 NOTE — Telephone Encounter (Signed)
Your patient 

## 2018-05-11 ENCOUNTER — Encounter: Payer: Self-pay | Admitting: Unknown Physician Specialty

## 2018-05-11 ENCOUNTER — Ambulatory Visit: Payer: BLUE CROSS/BLUE SHIELD | Admitting: Unknown Physician Specialty

## 2018-05-11 DIAGNOSIS — M1711 Unilateral primary osteoarthritis, right knee: Secondary | ICD-10-CM | POA: Diagnosis not present

## 2018-05-11 MED ORDER — MELOXICAM 7.5 MG PO TABS: ORAL_TABLET | ORAL | 2 refills | Status: DC

## 2018-05-11 NOTE — Patient Instructions (Signed)

## 2018-05-11 NOTE — Progress Notes (Signed)
BP 113/76   Pulse 67   Temp 97.7 F (36.5 C) (Oral)   Ht 5' 7"  (1.702 m)   Wt 297 lb 3.2 oz (134.8 kg)   LMP 04/15/2018 (Approximate)   SpO2 98%   BMI 46.55 kg/m    Subjective:    Patient ID: Lynn French, female    DOB: [birth year], 47 y.o.   MRN: [id number]  HPI: Lynn French is a 47 y.o. female  Chief Complaint  Patient presents with  . Letter for School/Work    Pt is here for LOA from work due to right knee pain secondary to OA confirmed by x-ray.  States she is trying to stay more active.  States she is better and feels like she can return to work.     Relevant past medical, surgical, family and social history reviewed and updated as indicated. Interim medical history since our last visit reviewed. Allergies and medications reviewed and updated.  Review of Systems  Per HPI unless specifically indicated above     Objective:    BP 113/76   Pulse 67   Temp 97.7 F (36.5 C) (Oral)   Ht 5' 7"  (1.702 m)   Wt 297 lb 3.2 oz (134.8 kg)   LMP 04/15/2018 (Approximate)   SpO2 98%   BMI 46.55 kg/m   Wt Readings from Last 3 Encounters:  05/11/18 297 lb 3.2 oz (134.8 kg)  04/29/18 295 lb 4.8 oz (133.9 kg)  04/26/18 296 lb 12.8 oz (134.6 kg)    Physical Exam  Constitutional: She is oriented to person, place, and time. She appears well-developed and well-nourished. No distress.  HENT:  Head: Normocephalic and atraumatic.  Eyes: Conjunctivae and lids are normal. Right eye exhibits no discharge. Left eye exhibits no discharge. No scleral icterus.  Pulmonary/Chest: Effort normal.  Abdominal: Normal appearance. There is no splenomegaly or hepatomegaly.  Musculoskeletal: Normal range of motion.  Neurological: She is alert and oriented to person, place, and time.  Skin: Skin is intact. No rash noted. No pallor.  Psychiatric: She has a normal mood and affect. Her behavior is normal. Judgment and thought content normal.    Results for orders placed or performed in  visit on 10/02/17  Thyroid Panel With TSH  Result Value Ref Range   TSH 1.560 0.450 - 4.500 uIU/mL   T4, Total 5.6 4.5 - 12.0 ug/dL   T3 Uptake Ratio 19 (L) 24 - 39 %   Free Thyroxine Index 1.1 (L) 1.2 - 4.9  Bayer DCA Hb A1c Waived  Result Value Ref Range   HB A1C (BAYER DCA - WAIVED) 5.8 <7.0 %  Comprehensive metabolic panel  Result Value Ref Range   Glucose 142 (H) 65 - 99 mg/dL   BUN 11 6 - 24 mg/dL   Creatinine, Ser 0.84 0.57 - 1.00 mg/dL   GFR calc non Af Amer 85 >59 mL/min/1.73   GFR calc Af Amer 98 >59 mL/min/1.73   BUN/Creatinine Ratio 13 9 - 23   Sodium 139 134 - 144 mmol/L   Potassium 4.9 3.5 - 5.2 mmol/L   Chloride 103 96 - 106 mmol/L   CO2 24 20 - 29 mmol/L   Calcium 8.7 8.7 - 10.2 mg/dL   Total Protein 6.3 6.0 - 8.5 g/dL   Albumin 3.4 (L) 3.5 - 5.5 g/dL   Globulin, Total 2.9 1.5 - 4.5 g/dL   Albumin/Globulin Ratio 1.2 1.2 - 2.2   Bilirubin Total 0.2 0.0 - 1.2 mg/dL  Alkaline Phosphatase 99 39 - 117 IU/L   AST 41 (H) 0 - 40 IU/L   ALT 27 0 - 32 IU/L  CBC with Differential/Platelet  Result Value Ref Range   WBC 6.1 3.4 - 10.8 x10E3/uL   RBC 4.16 3.77 - 5.28 x10E6/uL   Hemoglobin 10.9 (L) 11.1 - 15.9 g/dL   Hematocrit 34.5 34.0 - 46.6 %   MCV 83 79 - 97 fL   MCH 26.2 (L) 26.6 - 33.0 pg   MCHC 31.6 31.5 - 35.7 g/dL   RDW 16.5 (H) 12.3 - 15.4 %   Platelets 286 150 - 379 x10E3/uL   Neutrophils 63 Not Estab. %   Lymphs 25 Not Estab. %   Monocytes 6 Not Estab. %   Eos 6 Not Estab. %   Basos 0 Not Estab. %   Neutrophils Absolute 3.8 1.4 - 7.0 x10E3/uL   Lymphocytes Absolute 1.5 0.7 - 3.1 x10E3/uL   Monocytes Absolute 0.3 0.1 - 0.9 x10E3/uL   EOS (ABSOLUTE) 0.4 0.0 - 0.4 x10E3/uL   Basophils Absolute 0.0 0.0 - 0.2 x10E3/uL   Immature Granulocytes 0 Not Estab. %   Immature Grans (Abs) 0.0 0.0 - 0.1 x10E3/uL  VITAMIN D 25 Hydroxy (Vit-D Deficiency, Fractures)  Result Value Ref Range   Vit D, 25-Hydroxy 25.0 (L) 30.0 - 100.0 ng/mL      Assessment & Plan:     Problem List Items Addressed This Visit      Unprioritized   Primary osteoarthritis of right knee    FMLA papers filled out.  Discussed disease trajectory.  Exercises given.  Discussed Tylenol first choice.  Meloxicam for more significant pain.  Discussed activity is the best treatment      Relevant Medications   meloxicam (MOBIC) 7.5 MG tablet       Follow up plan: No follow-ups on file.

## 2018-05-11 NOTE — Assessment & Plan Note (Signed)
FMLA papers filled out.  Discussed disease trajectory.  Exercises given.  Discussed Tylenol first choice.  Meloxicam for more significant pain.  Discussed activity is the best treatment

## 2018-05-17 ENCOUNTER — Ambulatory Visit: Payer: BLUE CROSS/BLUE SHIELD | Admitting: Unknown Physician Specialty

## 2018-05-17 ENCOUNTER — Encounter: Payer: Self-pay | Admitting: Unknown Physician Specialty

## 2018-05-17 ENCOUNTER — Other Ambulatory Visit: Payer: Self-pay

## 2018-05-17 DIAGNOSIS — F320 Major depressive disorder, single episode, mild: Secondary | ICD-10-CM | POA: Diagnosis not present

## 2018-05-17 DIAGNOSIS — R7301 Impaired fasting glucose: Secondary | ICD-10-CM

## 2018-05-17 DIAGNOSIS — I10 Essential (primary) hypertension: Secondary | ICD-10-CM | POA: Diagnosis not present

## 2018-05-17 DIAGNOSIS — E038 Other specified hypothyroidism: Secondary | ICD-10-CM | POA: Diagnosis not present

## 2018-05-17 NOTE — Assessment & Plan Note (Signed)
Noted weight gain. Will recheck sugar at physical in November.

## 2018-05-17 NOTE — Assessment & Plan Note (Signed)
Borderline thyroid results.  Taking Armour thyroid for years.

## 2018-05-17 NOTE — Assessment & Plan Note (Signed)
Stable, continue present medications.   

## 2018-05-17 NOTE — Progress Notes (Signed)
BP 120/81   Pulse 67   Temp 97.7 F (36.5 C) (Oral)   Ht 5' 7"  (1.702 m)   Wt (!) 305 lb 2 oz (138.4 kg)   SpO2 97%   BMI 47.79 kg/m    Subjective:    Patient ID: Lynn French, female    DOB: 1971/03/06, 47 y.o.   MRN: [id number]  HPI: Alycia KEVONNA NOLTE is a 47 y.o. female  Chief Complaint  Patient presents with  . Follow-up   Depression Pt is seeing a therapist and planning on seeing psychiatrist.  Just started with therapy and too soon to see how it is going.  So far, depression is not to goal.   Depression screen [facility] 2/9 05/17/2018 01/04/2018 10/02/2017 07/10/2017 04/07/2017  Decreased Interest 2 3 1 1 1   Down, Depressed, Hopeless 1 0 0 1 2  PHQ - 2 Score 3 3 1 2 3   Altered sleeping 1 1 1 3 2   Tired, decreased energy 2 2 2 2 2   Change in appetite 2 2 1 2 3   Feeling bad or failure about yourself  1 0 0 0 1  Trouble concentrating 2 1 2 1 2   Moving slowly or fidgety/restless 0 0 0 0 0  Suicidal thoughts 1 0 0 0 1  PHQ-9 Score 12 9 7 10 14    Hypertension Using medications without difficulty Average home BPs Not checking   No problems or lightheadedness No chest pain with exertion or shortness of breath No Edema  Relevant past medical, surgical, family and social history reviewed and updated as indicated. Interim medical history since our last visit reviewed. Allergies and medications reviewed and updated.  Review of Systems  Per HPI unless specifically indicated above     Objective:    BP 120/81   Pulse 67   Temp 97.7 F (36.5 C) (Oral)   Ht 5' 7"  (1.702 m)   Wt (!) 305 lb 2 oz (138.4 kg)   SpO2 97%   BMI 47.79 kg/m   Wt Readings from Last 3 Encounters:  05/17/18 (!) 305 lb 2 oz (138.4 kg)  05/11/18 297 lb 3.2 oz (134.8 kg)  04/29/18 295 lb 4.8 oz (133.9 kg)    Physical Exam  Constitutional: She is oriented to person, place, and time. She appears well-developed and well-nourished. No distress.  HENT:  Head: Normocephalic and atraumatic.  Eyes:  Conjunctivae and lids are normal. Right eye exhibits no discharge. Left eye exhibits no discharge. No scleral icterus.  Neck: Normal range of motion. Neck supple. No JVD present. Carotid bruit is not present.  Cardiovascular: Normal rate, regular rhythm and normal heart sounds.  Pulmonary/Chest: Effort normal and breath sounds normal.  Abdominal: Normal appearance. There is no splenomegaly or hepatomegaly.  Musculoskeletal: Normal range of motion.  Neurological: She is alert and oriented to person, place, and time.  Skin: Skin is warm, dry and intact. No rash noted. No pallor.  Psychiatric: She has a normal mood and affect. Her behavior is normal. Judgment and thought content normal.    Results for orders placed or performed in visit on 10/02/17  Thyroid Panel With TSH  Result Value Ref Range   TSH 1.560 0.450 - 4.500 uIU/mL   T4, Total 5.6 4.5 - 12.0 ug/dL   T3 Uptake Ratio 19 (L) 24 - 39 %   Free Thyroxine Index 1.1 (L) 1.2 - 4.9  Bayer DCA Hb A1c Waived  Result Value Ref Range   HB A1C (BAYER  [facility] - WAIVED) 5.8 <7.0 %  Comprehensive metabolic panel  Result Value Ref Range   Glucose 142 (H) 65 - 99 mg/dL   BUN 11 6 - 24 mg/dL   Creatinine, Ser 0.84 0.57 - 1.00 mg/dL   GFR calc non Af Amer 85 >59 mL/min/1.73   GFR calc Af Amer 98 >59 mL/min/1.73   BUN/Creatinine Ratio 13 9 - 23   Sodium 139 134 - 144 mmol/L   Potassium 4.9 3.5 - 5.2 mmol/L   Chloride 103 96 - 106 mmol/L   CO2 24 20 - 29 mmol/L   Calcium 8.7 8.7 - 10.2 mg/dL   Total Protein 6.3 6.0 - 8.5 g/dL   Albumin 3.4 (L) 3.5 - 5.5 g/dL   Globulin, Total 2.9 1.5 - 4.5 g/dL   Albumin/Globulin Ratio 1.2 1.2 - 2.2   Bilirubin Total 0.2 0.0 - 1.2 mg/dL   Alkaline Phosphatase 99 39 - 117 IU/L   AST 41 (H) 0 - 40 IU/L   ALT 27 0 - 32 IU/L  CBC with Differential/Platelet  Result Value Ref Range   WBC 6.1 3.4 - 10.8 x10E3/uL   RBC 4.16 3.77 - 5.28 x10E6/uL   Hemoglobin 10.9 (L) 11.1 - 15.9 g/dL   Hematocrit 34.5 34.0 - 46.6  %   MCV 83 79 - 97 fL   MCH 26.2 (L) 26.6 - 33.0 pg   MCHC 31.6 31.5 - 35.7 g/dL   RDW 16.5 (H) 12.3 - 15.4 %   Platelets 286 150 - 379 x10E3/uL   Neutrophils 63 Not Estab. %   Lymphs 25 Not Estab. %   Monocytes 6 Not Estab. %   Eos 6 Not Estab. %   Basos 0 Not Estab. %   Neutrophils Absolute 3.8 1.4 - 7.0 x10E3/uL   Lymphocytes Absolute 1.5 0.7 - 3.1 x10E3/uL   Monocytes Absolute 0.3 0.1 - 0.9 x10E3/uL   EOS (ABSOLUTE) 0.4 0.0 - 0.4 x10E3/uL   Basophils Absolute 0.0 0.0 - 0.2 x10E3/uL   Immature Granulocytes 0 Not Estab. %   Immature Grans (Abs) 0.0 0.0 - 0.1 x10E3/uL  VITAMIN D 25 Hydroxy (Vit-D Deficiency, Fractures)  Result Value Ref Range   Vit D, 25-Hydroxy 25.0 (L) 30.0 - 100.0 ng/mL      Assessment & Plan:   Problem List Items Addressed This Visit      Unprioritized   Benign hypertension    Stable, continue present medications.        Depression    Not to goal but now with counselor and following with psychiatry.  Will defer any changes in treatment.        Hypothyroid    Borderline thyroid results.  Taking Armour thyroid for years.        Relevant Orders   Thyroid Panel With TSH   IFG (impaired fasting glucose)    Noted weight gain. Will recheck sugar at physical in November.            Follow up plan: Return if symptoms worsen or fail to improve.

## 2018-05-17 NOTE — Assessment & Plan Note (Signed)
Not to goal but now with counselor and following with psychiatry.  Will defer any changes in treatment.

## 2018-05-18 ENCOUNTER — Telehealth: Payer: Self-pay | Admitting: Unknown Physician Specialty

## 2018-05-18 DIAGNOSIS — E038 Other specified hypothyroidism: Secondary | ICD-10-CM

## 2018-05-18 LAB — THYROID PANEL WITH TSH
FREE THYROXINE INDEX: 1.5 (ref 1.2–4.9)
T3 UPTAKE RATIO: 22 % — AB (ref 24–39)
T4 TOTAL: 6.8 ug/dL (ref 4.5–12.0)
TSH: 3.17 u[IU]/mL (ref 0.450–4.500)

## 2018-05-18 NOTE — Telephone Encounter (Signed)
Discussed with pt that thyroid is non-specific.  Stop armour thyroid and recheck labs in 4-6 weeks.

## 2018-07-11 ENCOUNTER — Other Ambulatory Visit: Payer: Self-pay | Admitting: Unknown Physician Specialty

## 2018-07-12 ENCOUNTER — Other Ambulatory Visit: Payer: BLUE CROSS/BLUE SHIELD

## 2018-07-12 DIAGNOSIS — E038 Other specified hypothyroidism: Secondary | ICD-10-CM

## 2018-07-13 ENCOUNTER — Telehealth: Payer: Self-pay | Admitting: Family Medicine

## 2018-07-13 DIAGNOSIS — E039 Hypothyroidism, unspecified: Secondary | ICD-10-CM

## 2018-07-13 LAB — THYROID PANEL WITH TSH
FREE THYROXINE INDEX: 0.5 — AB (ref 1.2–4.9)
T3 UPTAKE RATIO: 15 % — AB (ref 24–39)
T4, Total: 3.2 ug/dL — ABNORMAL LOW (ref 4.5–12.0)
TSH: 93.72 u[IU]/mL — AB (ref 0.450–4.500)

## 2018-07-13 MED ORDER — LEVOTHYROXINE SODIUM 50 MCG PO TABS: 50.0000 ug | ORAL_TABLET | Freq: Every day | ORAL | 3 refills | Status: DC

## 2018-07-13 NOTE — Telephone Encounter (Signed)
Please let her know that her TSH went up significantly since d/c of armour thyroid so I've sent over synthroid for her to start. Let's see her back in 6 weeks for follow up

## 2018-07-13 NOTE — Telephone Encounter (Signed)
LVM for patient regarding new rx and tsh level (DPR Reviewed) and to call back to [place]/d an appt in 6 weeks.

## 2018-07-14 NOTE — Telephone Encounter (Signed)
OK to wait

## 2018-07-14 NOTE — Telephone Encounter (Signed)
Message relayed to patient. Verbalized understanding. Patient has OV scheduled for 8 weeks out. Okay to wait? OR need to reschedule to earlier? Please advise.

## 2018-07-25 ENCOUNTER — Other Ambulatory Visit: Payer: Self-pay | Admitting: Family Medicine

## 2018-09-20 ENCOUNTER — Ambulatory Visit: Payer: BLUE CROSS/BLUE SHIELD | Admitting: Family Medicine

## 2018-09-20 ENCOUNTER — Telehealth: Payer: Self-pay | Admitting: Family Medicine

## 2018-09-20 ENCOUNTER — Encounter: Payer: Self-pay | Admitting: Family Medicine

## 2018-09-20 VITALS — BP 128/82 | HR 76 | Temp 98.3°F | Ht 68.0 in | Wt 302.1 lb

## 2018-09-20 DIAGNOSIS — I10 Essential (primary) hypertension: Secondary | ICD-10-CM

## 2018-09-20 DIAGNOSIS — E038 Other specified hypothyroidism: Secondary | ICD-10-CM

## 2018-09-20 DIAGNOSIS — K219 Gastro-esophageal reflux disease without esophagitis: Secondary | ICD-10-CM

## 2018-09-20 DIAGNOSIS — Z8639 Personal history of other endocrine, nutritional and metabolic disease: Secondary | ICD-10-CM

## 2018-09-20 DIAGNOSIS — Z0000 Encounter for general adult medical examination without abnormal findings: Secondary | ICD-10-CM

## 2018-09-20 DIAGNOSIS — L0292 Furuncle, unspecified: Secondary | ICD-10-CM

## 2018-09-20 DIAGNOSIS — R7301 Impaired fasting glucose: Secondary | ICD-10-CM | POA: Diagnosis not present

## 2018-09-20 DIAGNOSIS — Z23 Encounter for immunization: Secondary | ICD-10-CM | POA: Diagnosis not present

## 2018-09-20 DIAGNOSIS — Z1239 Encounter for other screening for malignant neoplasm of breast: Secondary | ICD-10-CM

## 2018-09-20 DIAGNOSIS — E782 Mixed hyperlipidemia: Secondary | ICD-10-CM

## 2018-09-20 LAB — UA/M W/RFLX CULTURE, ROUTINE
BILIRUBIN UA: NEGATIVE
Glucose, UA: NEGATIVE
KETONES UA: NEGATIVE
Leukocytes, UA: NEGATIVE
Nitrite, UA: NEGATIVE
Protein, UA: NEGATIVE
RBC, UA: NEGATIVE
Specific Gravity, UA: 1.015 (ref 1.005–1.030)
UUROB: 0.2 mg/dL (ref 0.2–1.0)
pH, UA: 6.5 (ref 5.0–7.5)

## 2018-09-20 MED ORDER — MUPIROCIN CALCIUM 2 % EX CREA: 1.0000 "application " | TOPICAL_CREAM | Freq: Two times a day (BID) | CUTANEOUS | 6 refills | Status: DC | PRN

## 2018-09-20 MED ORDER — MUPIROCIN 2 % EX OINT: 1.0000 "application " | TOPICAL_OINTMENT | Freq: Two times a day (BID) | CUTANEOUS | 6 refills | Status: DC

## 2018-09-20 MED ORDER — PANTOPRAZOLE SODIUM 40 MG PO TBEC: 40.0000 mg | DELAYED_RELEASE_TABLET | Freq: Every day | ORAL | 3 refills | Status: DC

## 2018-09-20 NOTE — Assessment & Plan Note (Signed)
Recheck TSH, adjust as needed

## 2018-09-20 NOTE — Telephone Encounter (Signed)
Copied from [place] [id number]. Topic: General - Other >> Sep 20, 2018 11:22 AM Cecelia Byars, NT wrote: Reason for [id number] Pharmacy called and would like to know if the  mupirocin cream (BACTROBAN) 2 % can be ok'd to be filled as  ointment instead , please call [phone] (Phone)

## 2018-09-20 NOTE — Assessment & Plan Note (Signed)
Recheck A1C, continue working on lifestyle modifications

## 2018-09-20 NOTE — Telephone Encounter (Signed)
Ointment sent

## 2018-09-20 NOTE — Assessment & Plan Note (Signed)
Await lipid results, adjust as needed

## 2018-09-20 NOTE — Patient Instructions (Addendum)
Hibiclens wash  Call [facility] to schedule your mammogram [phone] [place], [place],  [id number]

## 2018-09-20 NOTE — Assessment & Plan Note (Signed)
Recheck B12, continue OTC supplementation

## 2018-09-20 NOTE — Assessment & Plan Note (Signed)
Suspect her intermittent N/V is from uncontrolled reflux. Will switch to protonix, work on [employer] loss/dietary modifications, and take pepcid prn

## 2018-09-20 NOTE — Assessment & Plan Note (Signed)
Stable and WNL, continue current regimen. Work on [employer], weight loss, exercise

## 2018-09-20 NOTE — Progress Notes (Signed)
BP 128/82   Pulse 76   Temp 98.3 F (36.8 C)   Ht 5' 8"  (1.727 m)   Wt (!) 302 lb 1 oz (137 kg)   LMP 09/05/2018 (Approximate)   SpO2 95%   BMI 45.93 kg/m    Subjective:    Patient ID: Lynn French, female    DOB: July 05, 1971, 47 y.o.   MRN: [id number]  HPI: Lynn French is a 46 y.o. female presenting on 09/20/2018 for comprehensive medical examination. Current medical complaints include:see below  Does not check BPs at home. States they pretty much always run well for her but is her heaviest weight right now and knows that's messing with things. Taking her HCTZ daily without issue.   Nausea and vomiting intermittently, nausea only lasting about 10 min and seems to come out of nowhere. Does note worsening reflux lately. Not seeming to be related to certain foods. Taking prilosec daily but doesn't notice a big benefit. Denies abdominal pain, changes in BMs, fevers, weight loss.   Moods, anxiety doing well on lexapro. Denies SI/HI, sleep or appetite issues. States her issues stem from family troubles but things are going well on that front right now.   She currently lives with: Menopausal Symptoms: no  Depression Screen done today and results listed below:  Depression screen [facility] 2/9 09/20/2018 05/17/2018 01/04/2018 10/02/2017 07/10/2017  Decreased Interest 1 2 3 1 1   Down, Depressed, Hopeless 0 1 0 0 1  PHQ - 2 Score 1 3 3 1 2   Altered sleeping 0 1 1 1 3   Tired, decreased energy 3 2 2 2 2   Change in appetite 0 2 2 1 2   Feeling bad or failure about yourself  0 1 0 0 0  Trouble concentrating 0 2 1 2 1   Moving slowly or fidgety/restless 0 0 0 0 0  Suicidal thoughts 0 1 0 0 0  PHQ-9 Score 4 12 9 7 10   Difficult doing work/chores Not difficult at all - - - -    The patient does not have a history of falls. I did not complete a risk assessment for falls. A plan of care for falls was not documented.   Past Medical History:  Past Medical History:  Diagnosis Date  . Acid reflux    . Anemia   . Anxiety   . Atypical squamous cells of undetermined significance (ASCUS) on Papanicolaou smear of cervix April 2016   HPV Negative, next pap due April 2017  . B12 deficiency 2000s   history of B12 deficiency  . Diabetes mellitus without complication ([place])   . Hyperlipidemia   . Hypothyroidism   . Obesity   . Tachycardia    with palpitations, negative work up.  . Tobacco use     Surgical History:  Past Surgical History:  Procedure Laterality Date  . APPENDECTOMY    . BLADDER SURGERY     bladder stem enlarged  . CESAREAN SECTION  11/17/11  . CHOLECYSTECTOMY    . TUBAL LIGATION  11/17/11  . TYMPANOSTOMY TUBE PLACEMENT      Medications:  Current Outpatient Medications on File Prior to Visit  Medication Sig  . Cyanocobalamin (VITAMIN B-12) 5000 MCG SUBL Place 5,000 mcg under the tongue daily.  Marland Kitchen escitalopram (LEXAPRO) 10 MG tablet TAKE 1 TABLET BY MOUTH ONCE DAILY  . hydrochlorothiazide (HYDRODIURIL) 25 MG tablet TAKE 2 TABLETS BY MOUTH ONCE DAILY  . levothyroxine (SYNTHROID, LEVOTHROID) 50 MCG tablet Take 1 tablet (50 mcg  total) by mouth daily.  . Loratadine 10 MG CAPS Take 10 mg by mouth daily.   . meloxicam (MOBIC) 7.5 MG tablet Take 1 tab in the morning and 1 at night if needed. Absolutely no more than two pills.   No current facility-administered medications on file prior to visit.     Allergies:  No Known Allergies  Social History:  Social History   Socioeconomic History  . Marital status: Single    Spouse name: Not on file  . Number of children: Not on file  . Years of education: Not on file  . Highest education level: Not on file  Occupational History  . Not on file  Social Needs  . Financial resource strain: Not on file  . Food insecurity:    Worry: Not on file    Inability: Not on file  . Transportation needs:    Medical: Not on file    Non-medical: Not on file  Tobacco Use  . Smoking status: Current Every Day Smoker    Packs/day:  0.50    Years: 20.00    Pack years: 10.00    Types: Cigarettes  . Smokeless tobacco: Never Used  Substance and Sexual Activity  . Alcohol use: No    Alcohol/week: 0.0 standard drinks    Comment: very rare  . Drug use: No  . Sexual activity: Not Currently  Lifestyle  . Physical activity:    Days per week: Not on file    Minutes per session: Not on file  . Stress: Not on file  Relationships  . Social connections:    Talks on phone: Not on file    Gets together: Not on file    Attends religious service: Not on file    Active member of club or organization: Not on file    Attends meetings of clubs or organizations: Not on file    Relationship status: Not on file  . Intimate partner violence:    Fear of current or ex partner: Not on file    Emotionally abused: Not on file    Physically abused: Not on file    Forced sexual activity: Not on file  Other Topics Concern  . Not on file  Social History Narrative  . Not on file   Social History   Tobacco Use  Smoking Status Current Every Day Smoker  . Packs/day: 0.50  . Years: 20.00  . Pack years: 10.00  . Types: Cigarettes  Smokeless Tobacco Never Used   Social History   Substance and Sexual Activity  Alcohol Use No  . Alcohol/week: 0.0 standard drinks   Comment: very rare    Family History:  Family History  Problem Relation Age of Onset  . Mental illness Mother   . Cancer Maternal Grandmother        breast  . Allergies Son   . Eczema Son     Past medical history, surgical history, medications, allergies, family history and social history reviewed with patient today and changes made to appropriate areas of the chart.   Review of Systems - General ROS: negative Psychological ROS: negative Ophthalmic ROS: negative ENT ROS: negative Allergy and Immunology ROS: negative Hematological and Lymphatic ROS: negative Endocrine ROS: negative Breast ROS: negative for breast lumps Respiratory ROS: no cough, shortness of  breath, or wheezing Cardiovascular ROS: no chest pain or dyspnea on exertion Gastrointestinal ROS: positive for - heartburn and nausea/vomiting Genito-Urinary ROS: no dysuria, trouble voiding, or hematuria Musculoskeletal ROS: negative Neurological  ROS: no TIA or stroke symptoms Dermatological ROS: negative All other ROS negative except what is listed above and in the HPI.      Objective:    BP 128/82   Pulse 76   Temp 98.3 F (36.8 C)   Ht 5' 8"  (1.727 m)   Wt (!) 302 lb 1 oz (137 kg)   LMP 09/05/2018 (Approximate)   SpO2 95%   BMI 45.93 kg/m   Wt Readings from Last 3 Encounters:  09/20/18 (!) 302 lb 1 oz (137 kg)  05/17/18 (!) 305 lb 2 oz (138.4 kg)  05/11/18 297 lb 3.2 oz (134.8 kg)    Physical Exam  Constitutional: She is oriented to person, place, and time. She appears well-developed and well-nourished. No distress.  HENT:  Head: Atraumatic.  Right Ear: External ear normal.  Left Ear: External ear normal.  Nose: Nose normal.  Mouth/Throat: Oropharynx is clear and moist. No oropharyngeal exudate.  Eyes: Pupils are equal, round, and reactive to light. Conjunctivae are normal. No scleral icterus.  Neck: Normal range of motion. Neck supple. No thyromegaly present.  Cardiovascular: Normal rate, regular rhythm, normal heart sounds and intact distal pulses.  Pulmonary/Chest: Effort normal and breath sounds normal. No respiratory distress. Right breast exhibits no mass, no nipple discharge, no skin change and no tenderness. Left breast exhibits no mass, no nipple discharge, no skin change and no tenderness.  Abdominal: Soft. Bowel sounds are normal. She exhibits no mass. There is no tenderness.  Musculoskeletal: Normal range of motion. She exhibits no edema or tenderness.  Lymphadenopathy:    She has no cervical adenopathy.  Neurological: She is alert and oriented to person, place, and time. No cranial nerve deficit.  Skin: Skin is warm and dry. No rash noted.  Psychiatric:  She has a normal mood and affect. Her behavior is normal.  Nursing note and vitals reviewed.   Results for orders placed or performed in visit on 09/20/18  UA/M w/rflx Culture, Routine  Result Value Ref Range   Specific Gravity, UA 1.015 1.005 - 1.030   pH, UA 6.5 5.0 - 7.5   Color, UA Yellow Yellow   Appearance Ur Clear Clear   Leukocytes, UA Negative Negative   Protein, UA Negative Negative/Trace   Glucose, UA Negative Negative   Ketones, UA Negative Negative   RBC, UA Negative Negative   Bilirubin, UA Negative Negative   Urobilinogen, Ur 0.2 0.2 - 1.0 mg/dL   Nitrite, UA Negative Negative      Assessment & Plan:   Problem List Items Addressed This Visit      Cardiovascular and Mediastinum   Benign hypertension - Primary    Stable and WNL, continue current regimen. Work on [employer], weight loss, exercise      Relevant Orders   CBC with Differential/Platelet   Comprehensive metabolic panel   UA/M w/rflx Culture, Routine (Completed)     Digestive   GERD (gastroesophageal reflux disease)    Suspect her intermittent N/V is from uncontrolled reflux. Will switch to protonix, work on [employer] loss/dietary modifications, and take pepcid prn      Relevant Medications   pantoprazole (PROTONIX) 40 MG tablet     Endocrine   Hypothyroid    Recheck TSH, adjust as needed      Relevant Orders   TSH   IFG (impaired fasting glucose)    Recheck A1C, continue working on lifestyle modifications      Relevant Orders   HgB A1c  Other   Hyperlipidemia    Await lipid results, adjust as needed      Relevant Orders   Lipid Panel w/o Chol/HDL Ratio   History of non anemic vitamin B12 deficiency    Recheck B12, continue OTC supplementation      Relevant Orders   Vitamin B12    Other Visit Diagnoses    Annual physical exam       Boil       Continue bactroban BID prn, start hibiclens wash several times weekly to affected areas   Immunization due       Relevant Orders     Flu Vaccine QUAD 6+ mos PF IM (Fluarix Quad PF) (Completed)   Screening for breast cancer       Relevant Orders   MM DIGITAL SCREENING BILATERAL       Follow up plan: Return in about 6 months (around 03/21/2019) for 6 month f/u.   LABORATORY TESTING:  - Pap smear: up to date  IMMUNIZATIONS:   - Tdap: Tetanus vaccination status reviewed: last tetanus booster within 10 years. - Influenza: Up to date  SCREENING: -Mammogram: Ordered today   PATIENT COUNSELING:   Advised to take 1 mg of folate supplement per day if capable of pregnancy.   Sexuality: Discussed sexually transmitted diseases, partner selection, use of condoms, avoidance of unintended pregnancy  and contraceptive alternatives.   Advised to avoid cigarette smoking.  I discussed with the patient that most people either abstain from alcohol or drink within safe limits (<=14/week and <=4 drinks/occasion for males, <=7/weeks and <= 3 drinks/occasion for females) and that the risk for alcohol disorders and other health effects rises proportionally with the number of drinks per week and how often a drinker exceeds daily limits.  Discussed cessation/primary prevention of drug use and availability of treatment for abuse.   Diet: Encouraged to adjust caloric intake to maintain  or achieve ideal body weight, to reduce intake of dietary saturated fat and total fat, to limit sodium intake by avoiding high sodium foods and not adding table salt, and to maintain adequate dietary potassium and calcium preferably from fresh fruits, vegetables, and low-fat dairy products.    stressed the importance of regular exercise  Injury prevention: Discussed safety belts, safety helmets, smoke detector, smoking near bedding or upholstery.   Dental health: Discussed importance of regular tooth brushing, flossing, and dental visits.    NEXT PREVENTATIVE PHYSICAL DUE IN 1 YEAR. Return in about 6 months (around 03/21/2019) for 6 month  f/u.

## 2018-09-21 ENCOUNTER — Other Ambulatory Visit: Payer: Self-pay | Admitting: Family Medicine

## 2018-09-21 DIAGNOSIS — E038 Other specified hypothyroidism: Secondary | ICD-10-CM

## 2018-09-21 LAB — CBC WITH DIFFERENTIAL/PLATELET
Basophils Absolute: 0.1 10*3/uL (ref 0.0–0.2)
Basos: 1 %
EOS (ABSOLUTE): 0.5 10*3/uL — ABNORMAL HIGH (ref 0.0–0.4)
Eos: 7 %
HEMATOCRIT: 38.2 % (ref 34.0–46.6)
HEMOGLOBIN: 12.6 g/dL (ref 11.1–15.9)
IMMATURE GRANULOCYTES: 0 %
Immature Grans (Abs): 0 10*3/uL (ref 0.0–0.1)
Lymphocytes Absolute: 1.9 10*3/uL (ref 0.7–3.1)
Lymphs: 26 %
MCH: 28.7 pg (ref 26.6–33.0)
MCHC: 33 g/dL (ref 31.5–35.7)
MCV: 87 fL (ref 79–97)
MONOCYTES: 5 %
Monocytes Absolute: 0.3 10*3/uL (ref 0.1–0.9)
Neutrophils Absolute: 4.5 10*3/uL (ref 1.4–7.0)
Neutrophils: 61 %
Platelets: 284 10*3/uL (ref 150–450)
RBC: 4.39 x10E6/uL (ref 3.77–5.28)
RDW: 14 % (ref 12.3–15.4)
WBC: 7.3 10*3/uL (ref 3.4–10.8)

## 2018-09-21 LAB — COMPREHENSIVE METABOLIC PANEL
ALBUMIN: 3.6 g/dL (ref 3.5–5.5)
ALT: 37 IU/L — ABNORMAL HIGH (ref 0–32)
AST: 52 IU/L — ABNORMAL HIGH (ref 0–40)
Albumin/Globulin Ratio: 1.2 (ref 1.2–2.2)
Alkaline Phosphatase: 105 IU/L (ref 39–117)
BUN / CREAT RATIO: 13 (ref 9–23)
BUN: 9 mg/dL (ref 6–24)
Bilirubin Total: <0.2 mg/dL (ref 0.0–1.2)
CALCIUM: 8.9 mg/dL (ref 8.7–10.2)
CO2: 28 mmol/L (ref 20–29)
Chloride: 96 mmol/L (ref 96–106)
Creatinine, Ser: 0.7 mg/dL (ref 0.57–1.00)
GFR calc Af Amer: 122 mL/min/{1.73_m2} (ref >59)
GFR, EST NON AFRICAN AMERICAN: 106 mL/min/{1.73_m2} (ref >59)
GLOBULIN, TOTAL: 3 g/dL (ref 1.5–4.5)
Glucose: 120 mg/dL — ABNORMAL HIGH (ref 65–99)
Potassium: 4.7 mmol/L (ref 3.5–5.2)
SODIUM: 138 mmol/L (ref 134–144)
Total Protein: 6.6 g/dL (ref 6.0–8.5)

## 2018-09-21 LAB — LIPID PANEL W/O CHOL/HDL RATIO
Cholesterol, Total: 173 mg/dL (ref 100–199)
HDL: 36 mg/dL — ABNORMAL LOW (ref >39)
LDL CALC: 113 mg/dL — AB (ref 0–99)
Triglycerides: 118 mg/dL (ref 0–149)
VLDL Cholesterol Cal: 24 mg/dL (ref 5–40)

## 2018-09-21 LAB — VITAMIN B12: Vitamin B-12: >2000 pg/mL — ABNORMAL HIGH (ref 232–1245)

## 2018-09-21 LAB — HEMOGLOBIN A1C
Est. average glucose Bld gHb Est-mCnc: 123 mg/dL
Hgb A1c MFr Bld: 5.9 % — ABNORMAL HIGH (ref 4.8–5.6)

## 2018-09-21 LAB — TSH: TSH: 18.62 u[IU]/mL — ABNORMAL HIGH (ref 0.450–4.500)

## 2018-09-21 MED ORDER — LEVOTHYROXINE SODIUM 75 MCG PO TABS: 75.0000 ug | ORAL_TABLET | Freq: Every day | ORAL | 0 refills | Status: DC

## 2018-09-22 ENCOUNTER — Other Ambulatory Visit: Payer: Self-pay | Admitting: Unknown Physician Specialty

## 2018-09-22 DIAGNOSIS — M1711 Unilateral primary osteoarthritis, right knee: Secondary | ICD-10-CM

## 2018-10-03 ENCOUNTER — Other Ambulatory Visit: Payer: Self-pay | Admitting: Unknown Physician Specialty

## 2018-10-04 NOTE — Telephone Encounter (Signed)
90 day supply given with 1 courtesy refill. Requested Prescriptions  Pending Prescriptions Disp Refills  . escitalopram (LEXAPRO) 10 MG tablet [Pharmacy Med Name: ESCITALOPRAM 10MG TAB] 90 tablet 1    Sig: TAKE 1 TABLET BY MOUTH ONCE DAILY     Psychiatry:  Antidepressants - SSRI Passed - 10/03/2018  9:02 AM      Passed - Completed PHQ-2 or PHQ-9 in the last 360 days.      Passed - Valid encounter within last 6 months    Recent Outpatient Visits          2 weeks ago Benign hypertension   [facility] Merrie Roof [place], [place]   4 months ago Benign hypertension   [facility] Kathrine Haddock, NP   4 months ago Primary osteoarthritis of right knee   [facility] Kathrine Haddock, NP   5 months ago Acute pain of right knee   [place], PA-C   5 months ago Acute pain of right knee   [facility] Trinna Post, PA-C      Future Appointments            In 5 months Orene Desanctis, Lilia Argue, [place], [place]

## 2018-12-11 ENCOUNTER — Other Ambulatory Visit: Payer: Self-pay | Admitting: Unknown Physician Specialty

## 2018-12-11 DIAGNOSIS — M1711 Unilateral primary osteoarthritis, right knee: Secondary | ICD-10-CM

## 2018-12-26 ENCOUNTER — Other Ambulatory Visit: Payer: Self-pay | Admitting: Family Medicine

## 2018-12-27 ENCOUNTER — Other Ambulatory Visit: Payer: BLUE CROSS/BLUE SHIELD

## 2018-12-27 DIAGNOSIS — E038 Other specified hypothyroidism: Secondary | ICD-10-CM

## 2018-12-27 NOTE — Telephone Encounter (Signed)
Requested Prescriptions  Pending Prescriptions Disp Refills  . hydrochlorothiazide (HYDRODIURIL) 25 MG tablet [Pharmacy Med Name: hydroCHLOROthiazide 25 MG Oral Tablet] 180 tablet 0    Sig: TAKE 2 TABLETS BY MOUTH ONCE DAILY     There is no refill protocol information for this order

## 2018-12-28 ENCOUNTER — Other Ambulatory Visit: Payer: Self-pay | Admitting: Family Medicine

## 2018-12-28 DIAGNOSIS — E039 Hypothyroidism, unspecified: Secondary | ICD-10-CM

## 2018-12-28 LAB — TSH: TSH: 10.99 u[IU]/mL — AB (ref 0.450–4.500)

## 2018-12-28 MED ORDER — LEVOTHYROXINE SODIUM 100 MCG PO TABS: 100.0000 ug | ORAL_TABLET | Freq: Every day | ORAL | 1 refills | Status: DC

## 2019-01-07 ENCOUNTER — Encounter: Payer: Self-pay | Admitting: Family Medicine

## 2019-01-07 ENCOUNTER — Ambulatory Visit: Payer: BLUE CROSS/BLUE SHIELD | Admitting: Family Medicine

## 2019-01-07 VITALS — BP 135/83 | HR 60 | Temp 97.5°F | Wt 298.8 lb

## 2019-01-07 DIAGNOSIS — R5383 Other fatigue: Secondary | ICD-10-CM | POA: Diagnosis not present

## 2019-01-07 DIAGNOSIS — K2950 Unspecified chronic gastritis without bleeding: Secondary | ICD-10-CM

## 2019-01-07 DIAGNOSIS — F320 Major depressive disorder, single episode, mild: Secondary | ICD-10-CM | POA: Diagnosis not present

## 2019-01-07 MED ORDER — DICLOFENAC SODIUM 1 % TD GEL: 2.0000 g | Freq: Four times a day (QID) | TRANSDERMAL | 2 refills | Status: DC

## 2019-01-07 MED ORDER — ESCITALOPRAM OXALATE 20 MG PO TABS: 20.0000 mg | ORAL_TABLET | Freq: Every day | ORAL | 0 refills | Status: DC

## 2019-01-07 NOTE — Patient Instructions (Addendum)
Can take tylenol [phone] mg up to 4 times daily AS NEEDED  Stop the meloxicam for the next month, try the diclofenac gel as needed instead (use [facility] card if insurance doesn't cover)  Start taking 20 mg lexapro instead of 10 mg  Get back in with counselor

## 2019-01-07 NOTE — Progress Notes (Signed)
BP 135/83   Pulse 60   Temp (!) 97.5 F (36.4 C) (Oral)   Wt 298 lb 12.8 oz (135.5 kg)   SpO2 98%   BMI 45.43 kg/m    Subjective:    Patient ID: Lynn French, female    DOB: 1971/03/17, 48 y.o.   MRN: [id number]  HPI: Ernesto ABBEYGAIL Lynn French is a 48 y.o. female  Chief Complaint  Patient presents with  . Depression   Here today following up on moods and fatigue. Feels things are under very poor control right now. No motivation to do anything lately. Has been on 10 mg lexapro for quite some time which previously worked well for her. Denies side effects, SI/HI, significant mood swings. Does have a counselor, but has not been in a while due to scheduling issues. Of note, still working to achieve normal TSH levels - dose recently increased and due for recheck in 1 month. Last TSH was 10.   Still nauseated and vomiting about once a week. The protonix was not working so went back to [employer]. Does take meloxicam daily. Takes tylenol occasionally. No melena, significant abdominal pain, fevers.   Depression screen [facility] 2/9 01/07/2019 09/20/2018 05/17/2018  Decreased Interest 3 1 2   Down, Depressed, Hopeless 1 0 1  PHQ - 2 Score 4 1 3   Altered sleeping 1 0 1  Tired, decreased energy 3 3 2   Change in appetite 3 0 2  Feeling bad or failure about yourself  1 0 1  Trouble concentrating 2 0 2  Moving slowly or fidgety/restless 0 0 0  Suicidal thoughts 0 0 1  PHQ-9 Score 14 4 12   Difficult doing work/chores - Not difficult at all -  Some recent data might be hidden  No flowsheet data found.     Relevant past medical, surgical, family and social history reviewed and updated as indicated. Interim medical history since our last visit reviewed. Allergies and medications reviewed and updated.  Review of Systems  Per HPI unless specifically indicated above     Objective:    BP 135/83   Pulse 60   Temp (!) 97.5 F (36.4 C) (Oral)   Wt 298 lb 12.8 oz (135.5 kg)   SpO2 98%   BMI 45.43 kg/m    Wt Readings from Last 3 Encounters:  01/07/19 298 lb 12.8 oz (135.5 kg)  09/20/18 (!) 302 lb 1 oz (137 kg)  05/17/18 (!) 305 lb 2 oz (138.4 kg)    Physical Exam Vitals signs and nursing note reviewed.  Constitutional:      Appearance: Normal appearance. She is not ill-appearing.  HENT:     Head: Atraumatic.  Eyes:     Extraocular Movements: Extraocular movements intact.     Conjunctiva/sclera: Conjunctivae normal.  Neck:     Musculoskeletal: Normal range of motion and neck supple.  Cardiovascular:     Rate and Rhythm: Normal rate and regular rhythm.     Heart sounds: Normal heart sounds.  Pulmonary:     Effort: Pulmonary effort is normal.     Breath sounds: Normal breath sounds.  Abdominal:     General: Bowel sounds are normal.     Palpations: Abdomen is soft.     Tenderness: There is no abdominal tenderness. There is no guarding or rebound.  Musculoskeletal: Normal range of motion.  Skin:    General: Skin is warm and dry.  Neurological:     Mental Status: She is alert and oriented to person,  place, and time.  Psychiatric:        Mood and Affect: Mood normal.        Thought Content: Thought content normal.        Judgment: Judgment normal.     Results for orders placed or performed in visit on 12/27/18  TSH  Result Value Ref Range   TSH 10.990 (H) 0.450 - 4.500 uIU/mL      Assessment & Plan:   Problem List Items Addressed This Visit      Other   Depression    Increase lexapro to 20 mg, restart counseling. F/u in 1 month      Relevant Medications   escitalopram (LEXAPRO) 20 MG tablet    Other Visit Diagnoses    Chronic gastritis without bleeding, unspecified gastritis type    -  Primary   D/c meloxicam, start diclofenac gel. Can take tylenol prn. Continue prilosec daily, will add carafate prn if still symptomatic   Fatigue, unspecified type       Unclear if from depression, uncontrolled hypothyroid, or another cause. Will adjust SSRI and continue improving  thyroid control, monitor for benefit       Follow up plan: Return in about 4 weeks (around 02/04/2019) for Mood, TSH, nausea f/u.

## 2019-01-09 NOTE — Assessment & Plan Note (Signed)
Increase lexapro to 20 mg, restart counseling. F/u in 1 month

## 2019-01-14 ENCOUNTER — Telehealth: Payer: Self-pay

## 2019-01-14 NOTE — Telephone Encounter (Signed)
PA submitted via Cover My Meds for diclofenac sodium 1% gel. Awaiting approval or denial.

## 2019-01-26 NOTE — Telephone Encounter (Signed)
PA denied, routing to provider to advise.

## 2019-01-26 NOTE — Telephone Encounter (Signed)
Can use goodrx, think it's cheapest at [facility]

## 2019-01-26 NOTE — Telephone Encounter (Signed)
Called and left detailed message with information on patients vm. DPR was checked.

## 2019-02-07 ENCOUNTER — Other Ambulatory Visit: Payer: Self-pay

## 2019-02-07 ENCOUNTER — Encounter: Payer: Self-pay | Admitting: Family Medicine

## 2019-02-07 ENCOUNTER — Ambulatory Visit (INDEPENDENT_AMBULATORY_CARE_PROVIDER_SITE_OTHER): Payer: BLUE CROSS/BLUE SHIELD | Admitting: Family Medicine

## 2019-02-07 VITALS — BP 121/81 | HR 65 | Temp 97.9°F

## 2019-02-07 DIAGNOSIS — K219 Gastro-esophageal reflux disease without esophagitis: Secondary | ICD-10-CM | POA: Diagnosis not present

## 2019-02-07 DIAGNOSIS — F320 Major depressive disorder, single episode, mild: Secondary | ICD-10-CM

## 2019-02-07 DIAGNOSIS — E039 Hypothyroidism, unspecified: Secondary | ICD-10-CM

## 2019-02-07 MED ORDER — ESCITALOPRAM OXALATE 20 MG PO TABS: 20.0000 mg | ORAL_TABLET | Freq: Every day | ORAL | 0 refills | Status: DC

## 2019-02-07 MED ORDER — SUCRALFATE 1 G PO TABS: 1.0000 g | ORAL_TABLET | Freq: Three times a day (TID) | ORAL | 0 refills | Status: DC

## 2019-02-07 NOTE — Progress Notes (Signed)
BP 121/81   Pulse 65   Temp 97.9 F (36.6 C) (Oral)   SpO2 96%    Subjective:    Patient ID: Lynn French, female    DOB: 04/09/1971, 48 y.o.   MRN: [id number]  HPI: Lynn French is a 48 y.o. female  Chief Complaint  Patient presents with  . Depression  . Anxiety  . Hypothyroidism  . Nausea    Patient states she's still having nausea. No improvement.    . This visit was completed via WebEx due to the restrictions of the COVID-19 pandemic. All issues as above were discussed and addressed. Physical exam was done as above through visual confirmation on WebEx. If it was felt that the patient should be evaluated in the office, they were directed there. The patient verbally consented to this visit. . Location of the patient: home . Location of the provider: home . Those involved with this call:  . Provider: Merrie Roof, PA-C . CMA: Merilyn Baba, [place] . Front Desk/Registration: Jill Side  . Time spent on call: 26 minutes with patient face to face via video conference. More than 50% of this time was spent in counseling and coordination of care.  Nausea several times daily every day. Does not throw up, no diarrhea, fevers. Has had a few nights of belly pain but attributes that to taking a laxative, resolved pain with BM. Wondering if it's related to drainage which has been increased. Stopped the meloxicam as discussed, has the diclofenac gel but doesn't have to use it often. Only taking tylenol for her pain right now. Compliant with her prilosec daily.   Synthroid has been increased to 100 mcg, tolerating well without obvious sxs.   Moods aren't doing great since being confined to the house and having all the COVID changes going on. Does feel like the increased dose of lexapro may be helping some. Denies SI/HI, still having some lack of motivation and fatigue but states that's actually been helping with the quarantine situation.   Depression screen [facility] 2/9 02/07/2019 01/07/2019  09/20/2018  Decreased Interest 3 3 1   Down, Depressed, Hopeless 0 1 0  PHQ - 2 Score 3 4 1   Altered sleeping 2 1 0  Tired, decreased energy 3 3 3   Change in appetite 2 3 0  Feeling bad or failure about yourself  1 1 0  Trouble concentrating 2 2 0  Moving slowly or fidgety/restless 0 0 0  Suicidal thoughts 0 0 0  PHQ-9 Score 13 14 4   Difficult doing work/chores Somewhat difficult - Not difficult at all  Some recent data might be hidden   GAD 7 : Generalized Anxiety Score 02/07/2019  Nervous, Anxious, on Edge 3  Control/stop worrying 2  Worry too much - different things 2  Trouble relaxing 2  Restless 2  Easily annoyed or irritable 3  Afraid - awful might happen 1  Total GAD 7 Score 15  Anxiety Difficulty Somewhat difficult     Relevant past medical, surgical, family and social history reviewed and updated as indicated. Interim medical history since our last visit reviewed. Allergies and medications reviewed and updated.  Review of Systems  Per HPI unless specifically indicated above     Objective:    BP 121/81   Pulse 65   Temp 97.9 F (36.6 C) (Oral)   SpO2 96%   Wt Readings from Last 3 Encounters:  01/07/19 298 lb 12.8 oz (135.5 kg)  09/20/18 (!) 302 lb 1  oz (137 kg)  05/17/18 (!) 305 lb 2 oz (138.4 kg)    Physical Exam Vitals signs and nursing note reviewed.  Constitutional:      General: She is not in acute distress.    Appearance: Normal appearance.  HENT:     Head: Atraumatic.     Right Ear: External ear normal.     Left Ear: External ear normal.     Nose: Nose normal. No congestion.     Mouth/Throat:     Mouth: Mucous membranes are moist.     Pharynx: Oropharynx is clear. Posterior oropharyngeal erythema present.  Eyes:     Extraocular Movements: Extraocular movements intact.     Conjunctiva/sclera: Conjunctivae normal.  Neck:     Musculoskeletal: Normal range of motion.  Cardiovascular:     Comments: Unable to assess via virtual visit  Pulmonary:     Effort: Pulmonary effort is normal. No respiratory distress.  Abdominal:     Comments: Abdomen non-tender to patient's self palpation (unable to examine in person due to COVID19 restrictions)  Musculoskeletal: Normal range of motion.  Skin:    General: Skin is dry.     Findings: No erythema.  Neurological:     Mental Status: She is alert and oriented to person, place, and time.  Psychiatric:        Mood and Affect: Mood normal.        Thought Content: Thought content normal.        Judgment: Judgment normal.     Results for orders placed or performed in visit on 12/27/18  TSH  Result Value Ref Range   TSH 10.990 (H) 0.450 - 4.500 uIU/mL      Assessment & Plan:   Problem List Items Addressed This Visit      Digestive   GERD (gastroesophageal reflux disease)    Suspect her nausea is still some leftover gastritis from the meloxicam use daily, will add carafate to prilosec regimen. BRAT diet, small meals throughout day, f/u if worsening - return precautions reviewed      Relevant Medications   sucralfate (CARAFATE) 1 g tablet     Endocrine   Hypothyroid - Primary    Recheck TSH, adjust as needed      Relevant Medications   levothyroxine (SYNTHROID, LEVOTHROID) 100 MCG tablet   Other Relevant Orders   TSH (Completed)     Other   Depression    Some improvement with increased lexapro dose, feels some of her issue right now is situational with the pandemic and changes associated with it. Does not wish to change regimen at this time. Discussed telehealth counseling, walk in resources, ER if things do worsen       Relevant Medications   escitalopram (LEXAPRO) 20 MG tablet       Follow up plan: Return in about 3 months (around 05/09/2019) for Mood, nausea f/u.

## 2019-02-08 ENCOUNTER — Other Ambulatory Visit: Payer: BLUE CROSS/BLUE SHIELD

## 2019-02-08 ENCOUNTER — Encounter: Payer: Self-pay | Admitting: Family Medicine

## 2019-02-08 DIAGNOSIS — E039 Hypothyroidism, unspecified: Secondary | ICD-10-CM

## 2019-02-09 LAB — TSH: TSH: 4.14 u[IU]/mL (ref 0.450–4.500)

## 2019-02-10 ENCOUNTER — Telehealth: Payer: Self-pay | Admitting: Family Medicine

## 2019-02-10 MED ORDER — LEVOTHYROXINE SODIUM 100 MCG PO TABS: 100.0000 ug | ORAL_TABLET | Freq: Every day | ORAL | 1 refills | Status: DC

## 2019-02-10 NOTE — Telephone Encounter (Signed)
Called pt to schedule 3 month fu no answer, left message on voicemail.

## 2019-02-10 NOTE — Assessment & Plan Note (Signed)
Recheck TSH, adjust as needed

## 2019-02-10 NOTE — Assessment & Plan Note (Signed)
Suspect her nausea is still some leftover gastritis from the meloxicam use daily, will add carafate to prilosec regimen. BRAT diet, small meals throughout day, f/u if worsening - return precautions reviewed

## 2019-02-10 NOTE — Assessment & Plan Note (Signed)
Some improvement with increased lexapro dose, feels some of her issue right now is situational with the pandemic and changes associated with it. Does not wish to change regimen at this time. Discussed telehealth counseling, walk in resources, ER if things do worsen

## 2019-03-21 ENCOUNTER — Ambulatory Visit: Payer: BLUE CROSS/BLUE SHIELD | Admitting: Family Medicine

## 2019-05-09 ENCOUNTER — Ambulatory Visit: Payer: Self-pay | Admitting: *Deleted

## 2019-05-09 NOTE — Telephone Encounter (Signed)
Pt called to see if she could get tested for covid-19. 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 So she has been in quarantine since then. She denies any symptoms and would like a call back from her provider regarding testing. Advised her that she could a  Pop test site or [facility] mobile unit. She voiced understanding but would like a call back from the office. No triage. Routing to the [facility] for review.

## 2019-05-10 NOTE — Telephone Encounter (Signed)
Appt scheduled for tomorrow.

## 2019-05-11 ENCOUNTER — Other Ambulatory Visit: Payer: Self-pay

## 2019-05-11 ENCOUNTER — Encounter: Payer: Self-pay | Admitting: Family Medicine

## 2019-05-11 ENCOUNTER — Ambulatory Visit (INDEPENDENT_AMBULATORY_CARE_PROVIDER_SITE_OTHER): Payer: BLUE CROSS/BLUE SHIELD | Admitting: Family Medicine

## 2019-05-11 VITALS — Ht 68.0 in

## 2019-05-11 DIAGNOSIS — Z20828 Contact with and (suspected) exposure to other viral communicable diseases: Secondary | ICD-10-CM | POA: Diagnosis not present

## 2019-05-11 DIAGNOSIS — Z20822 Contact with and (suspected) exposure to covid-19: Secondary | ICD-10-CM

## 2019-05-11 NOTE — Progress Notes (Signed)
Ht 5' 8"  (1.727 m)   BMI 45.43 kg/m    Subjective:    Patient ID: Lynn French, female    DOB: [birth year], 48 y.o.   MRN: [id number]  HPI: Lynn French is a 48 y.o. female  Chief Complaint  Patient presents with  . Covid 19 exosure    at work over a week ago    . This visit was completed via WebEx due to the restrictions of the COVID-19 pandemic. All issues as above were discussed and addressed. Physical exam was done as above through visual confirmation on WebEx. If it was felt that the patient should be evaluated in the office, they were directed there. The patient verbally consented to this visit. . Location of the patient: home . Location of the provider: work . Those involved with this call:  . Provider: Merrie Roof, PA-C . CMA: Lesle Chris, [place] . Front Desk/Registration: Jill Side  . Time spent on call: 15 minutes with patient face to face via video conference. More than 50% of this time was spent in counseling and coordination of care. 5 minutes total spent in review of patient's record and preparation of their chart. I verified patient identity using two factors (patient name and date of birth). Patient consents verbally to being seen via telemedicine visit today.   Had a COVID exposure at work about a week ago. Has remained asymptomatic and on quarantine. Requesting testing. Denies fevers, chills, sweats, CP, SOB, HAs, N/V/D.   Relevant past medical, surgical, family and social history reviewed and updated as indicated. Interim medical history since our last visit reviewed. Allergies and medications reviewed and updated.  Review of Systems  Per HPI unless specifically indicated above     Objective:    Ht 5' 8"  (1.727 m)   BMI 45.43 kg/m   Wt Readings from Last 3 Encounters:  01/07/19 298 lb 12.8 oz (135.5 kg)  09/20/18 (!) 302 lb 1 oz (137 kg)  05/17/18 (!) 305 lb 2 oz (138.4 kg)    Physical Exam Vitals signs and nursing note reviewed.   Constitutional:      General: She is not in acute distress.    Appearance: Normal appearance.  HENT:     Head: Atraumatic.     Right Ear: External ear normal.     Left Ear: External ear normal.     Nose: Nose normal. No congestion.     Mouth/Throat:     Mouth: Mucous membranes are moist.     Pharynx: Oropharynx is clear. No posterior oropharyngeal erythema.  Eyes:     Extraocular Movements: Extraocular movements intact.     Conjunctiva/sclera: Conjunctivae normal.  Neck:     Musculoskeletal: Normal range of motion.  Cardiovascular:     Comments: Unable to assess via virtual visit Pulmonary:     Effort: Pulmonary effort is normal. No respiratory distress.  Musculoskeletal: Normal range of motion.  Skin:    General: Skin is dry.     Findings: No erythema.  Neurological:     Mental Status: She is alert and oriented to person, place, and time.  Psychiatric:        Mood and Affect: Mood normal.        Thought Content: Thought content normal.        Judgment: Judgment normal.     Results for orders placed or performed in visit on 02/08/19  TSH  Result Value Ref Range   TSH 4.140 0.450 - 4.500  uIU/mL      Assessment & Plan:   Problem List Items Addressed This Visit    None    Visit Diagnoses    Exposure to Covid-19 Virus    -  Primary   Asymptomatic, will refer for testing and continue quarantine until results. Call back if developing sxs       Follow up plan: Return if symptoms worsen or fail to improve.

## 2019-05-12 ENCOUNTER — Telehealth: Payer: Self-pay | Admitting: *Deleted

## 2019-05-12 ENCOUNTER — Other Ambulatory Visit: Payer: BLUE CROSS/BLUE SHIELD

## 2019-05-12 DIAGNOSIS — Z20822 Contact with and (suspected) exposure to covid-19: Secondary | ICD-10-CM

## 2019-05-12 NOTE — Telephone Encounter (Signed)
-----   Message from Volney American, [place] sent at 05/11/2019 10:15 AM EDT ----- Regarding: COVID 19 testing Patient had a positive exposure at work 1 week ago. Asymptomatic [employer]

## 2019-05-12 NOTE — Telephone Encounter (Signed)
Spoke with patient.  Scheduled her for COVID 19 test today at [facility].  Testing protocol reviewed.

## 2019-05-17 LAB — NOVEL CORONAVIRUS, NAA: SARS-CoV-2, NAA: NOT DETECTED

## 2019-05-20 ENCOUNTER — Ambulatory Visit: Payer: BLUE CROSS/BLUE SHIELD | Admitting: Family Medicine

## 2019-05-22 ENCOUNTER — Other Ambulatory Visit: Payer: Self-pay | Admitting: Unknown Physician Specialty

## 2019-05-22 NOTE — Telephone Encounter (Signed)
Requested medication (s) are due for refill today: yes  Requested medication (s) are on the active medication list: historical med  Last refill:  01/07/19  Future visit scheduled: yes  Notes to clinic:  Historical med and provider   Requested Prescriptions  Pending Prescriptions Disp Refills   omeprazole (PRILOSEC) 40 MG capsule [Pharmacy Med Name: Omeprazole 40 MG Oral Capsule Delayed Release] 90 capsule 0    Sig: Take 1 capsule by mouth once daily     Gastroenterology: Proton Pump Inhibitors Passed - 05/22/2019  2:09 PM      Passed - Valid encounter within last 12 months    Recent Outpatient Visits          1 week ago Exposure to [place], Lilia Argue, PA-C   3 months ago Hypothyroidism, unspecified type   [facility], [place], [place]   4 months ago Chronic gastritis without bleeding, unspecified gastritis type   [facility] Merrie Roof [place], [place]   8 months ago Benign hypertension   [facility] Merrie Roof [place], [place]   1 year ago Benign hypertension   [facility] Kathrine Haddock, NP      Future Appointments            In 4 days Orene Desanctis, Lilia Argue, [place], [facility]

## 2019-05-26 ENCOUNTER — Ambulatory Visit: Payer: Self-pay | Admitting: Family Medicine

## 2019-06-21 ENCOUNTER — Other Ambulatory Visit: Payer: Self-pay | Admitting: Family Medicine

## 2019-06-21 NOTE — Telephone Encounter (Signed)
No pending ov, please advise

## 2019-06-21 NOTE — Telephone Encounter (Signed)
Called patient. No answer. Left detailed VM (DPR reviewed) and asked patient to return call to the office to schedule an OV

## 2019-06-21 NOTE — Telephone Encounter (Signed)
Needs appointment

## 2019-06-21 NOTE — Telephone Encounter (Signed)
Routing to provider  

## 2019-06-23 ENCOUNTER — Encounter: Payer: Self-pay | Admitting: Nurse Practitioner

## 2019-06-23 ENCOUNTER — Other Ambulatory Visit: Payer: Self-pay

## 2019-06-23 ENCOUNTER — Ambulatory Visit (INDEPENDENT_AMBULATORY_CARE_PROVIDER_SITE_OTHER): Payer: BLUE CROSS/BLUE SHIELD | Admitting: Nurse Practitioner

## 2019-06-23 DIAGNOSIS — F320 Major depressive disorder, single episode, mild: Secondary | ICD-10-CM

## 2019-06-23 MED ORDER — ESCITALOPRAM OXALATE 20 MG PO TABS: 20.0000 mg | ORAL_TABLET | Freq: Every day | ORAL | 3 refills | Status: DC

## 2019-06-23 NOTE — Assessment & Plan Note (Signed)
Chronic and reports good control on current regimen. Denies SI/HI.  Continue Lexapro 20 MG daily and return as scheduled for office visits with PCP. Refill sent.

## 2019-06-23 NOTE — Progress Notes (Signed)
There were no vitals taken for this visit.   Subjective:    Patient ID: Lynn French, female    DOB: 1971-05-17, 48 y.o.   MRN: [id number]  HPI: Lynn French is a 48 y.o. female  Chief Complaint  Patient presents with  . Depression    needs refill on escitalopram    . This visit was completed via FaceTime due to the restrictions of the COVID-19 pandemic. All issues as above were discussed and addressed. Physical exam was done as above through visual confirmation on Facetime. If it was felt that the patient should be evaluated in the office, they were directed there. The patient verbally consented to this visit. . Location of the patient: home . Location of the provider: home . Those involved with this call:  . Provider: Marnee Guarneri, DNP . CMA: Yvonna Alanis, CMA . Front Desk/Registration: Jill Side  . Time spent on call: 15 minutes with patient face to face via video conference. More than 50% of this time was spent in counseling and coordination of care. 10 minutes total spent in review of patient's record and preparation of their chart.  . I verified patient identity using two factors (patient name and date of birth). Patient consents verbally to being seen via telemedicine visit today.    DEPRESSION Currently taking Lexapro 20 MG daily and reports benefit from this. Mood status: stable Satisfied with current treatment?: yes Symptom severity: mild  Duration of current treatment : chronic Side effects: no Medication compliance: good compliance Psychotherapy/counseling: yes in the past Previous psychiatric medications: Lexapro Depressed mood: yes Anxious mood: no Anhedonia: no Significant weight loss or gain: no Insomnia: none Fatigue: no Feelings of worthlessness or guilt: no Impaired concentration/indecisiveness: no Suicidal ideations: no Hopelessness: no Crying spells: no Depression screen [facility] 2/9 06/23/2019 02/07/2019 01/07/2019 09/20/2018 05/17/2018   Decreased Interest 3 3 3 1 2   Down, Depressed, Hopeless 0 0 1 0 1  PHQ - 2 Score 3 3 4 1 3   Altered sleeping 1 2 1  0 1  Tired, decreased energy 2 3 3 3 2   Change in appetite 1 2 3  0 2  Feeling bad or failure about yourself  0 1 1 0 1  Trouble concentrating 1 2 2  0 2  Moving slowly or fidgety/restless 0 0 0 0 0  Suicidal thoughts 0 0 0 0 1  PHQ-9 Score 8 13 14 4 12   Difficult doing work/chores Somewhat difficult Somewhat difficult - Not difficult at all -  Some recent data might be hidden    Relevant past medical, surgical, family and social history reviewed and updated as indicated. Interim medical history since our last visit reviewed. Allergies and medications reviewed and updated.  Review of Systems  Constitutional: Negative for activity change, appetite change, diaphoresis, fatigue and fever.  Respiratory: Negative for cough, chest tightness and shortness of breath.   Cardiovascular: Negative for chest pain, palpitations and leg swelling.  Gastrointestinal: Negative for abdominal distention, abdominal pain, constipation, diarrhea, nausea and vomiting.  Psychiatric/Behavioral: Negative for decreased concentration, self-injury, sleep disturbance and suicidal ideas. The patient is not nervous/anxious.     Per HPI unless specifically indicated above     Objective:    There were no vitals taken for this visit.  Wt Readings from Last 3 Encounters:  01/07/19 298 lb 12.8 oz (135.5 kg)  09/20/18 (!) 302 lb 1 oz (137 kg)  05/17/18 (!) 305 lb 2 oz (138.4 kg)    Physical Exam  Vitals signs and nursing note reviewed.  Constitutional:      General: She is awake. She is not in acute distress.    Appearance: She is well-developed. She is not ill-appearing.  HENT:     Head: Normocephalic.     Right Ear: Hearing normal.     Left Ear: Hearing normal.  Eyes:     General: Lids are normal.        Right eye: No discharge.        Left eye: No discharge.     Conjunctiva/sclera:  Conjunctivae normal.  Neck:     Musculoskeletal: Normal range of motion.  Cardiovascular:     Comments: Unable to auscultate due to virtual exam only  Pulmonary:     Effort: Pulmonary effort is normal. No accessory muscle usage or respiratory distress.     Comments: Unable to auscultate due to virtual exam only  Neurological:     Mental Status: She is alert and oriented to person, place, and time.  Psychiatric:        Attention and Perception: Attention normal.        Mood and Affect: Mood normal.        Behavior: Behavior normal. Behavior is cooperative.        Thought Content: Thought content normal.        Judgment: Judgment normal.     Results for orders placed or performed in visit on 05/12/19  Novel Coronavirus, NAA ([facility])  Result Value Ref Range   SARS-CoV-2, NAA Not Detected Not Detected      Assessment & Plan:   Problem List Items Addressed This Visit      Other   Depression    Chronic and reports good control on current regimen. Denies SI/HI.  Continue Lexapro 20 MG daily and return as scheduled for office visits with PCP. Refill sent.      Relevant Medications   escitalopram (LEXAPRO) 20 MG tablet      I discussed the assessment and treatment plan with the patient. The patient was provided an opportunity to ask questions and all were answered. The patient agreed with the plan and demonstrated an understanding of the instructions.   The patient was advised to call back or seek an in-person evaluation if the symptoms worsen or if the condition fails to improve as anticipated.   I provided 15 minutes of time during this encounter.  Follow up plan: Return for as scheduled, has appointment on 30th for pap.

## 2019-06-23 NOTE — Patient Instructions (Signed)
Living With Depression Everyone experiences occasional disappointment, sadness, and loss in their lives. When you are feeling down, blue, or sad for at least 2 weeks in a row, it may mean that you have depression. Depression can affect your thoughts and feelings, relationships, daily activities, and physical health. It is caused by changes in the way your brain functions. If you receive a diagnosis of depression, your health care provider will tell you which type of depression you have and what treatment options are available to you. If you are living with depression, there are ways to help you recover from it and also ways to prevent it from coming back. How to cope with lifestyle changes Coping with stress     Stress is your body's reaction to life changes and events, both good and bad. Stressful situations may include:  Getting married.  The death of a spouse.  Losing a job.  Retiring.  Having a baby. Stress can last just a few hours or it can be ongoing. Stress can play a major role in depression, so it is important to learn both how to cope with stress and how to think about it differently. Talk with your health care provider or a counselor if you would like to learn more about stress reduction. He or she may suggest some stress reduction techniques, such as:  Music therapy. This can include creating music or listening to music. Choose music that you enjoy and that inspires you.  Mindfulness-based meditation. This kind of meditation can be done while sitting or walking. It involves being aware of your normal breaths, rather than trying to control your breathing.  Centering prayer. This is a kind of meditation that involves focusing on a spiritual word or phrase. Choose a word, phrase, or sacred image that is meaningful to you and that brings you peace.  Deep breathing. To do this, expand your stomach and inhale slowly through your nose. Hold your breath for 3-5 seconds, then exhale  slowly, allowing your stomach muscles to relax.  Muscle relaxation. This involves intentionally tensing muscles then relaxing them. Choose a stress reduction technique that fits your lifestyle and personality. Stress reduction techniques take time and practice to develop. Set aside 5-15 minutes a day to do them. Therapists can offer training in these techniques. The training may be covered by some insurance plans. Other things you can do to manage stress include:  Keeping a stress diary. This can help you learn what triggers your stress and ways to control your response.  Understanding what your limits are and saying no to requests or events that lead to a schedule that is too full.  Thinking about how you respond to certain situations. You may not be able to control everything, but you can control how you react.  Adding humor to your life by watching funny films or TV shows.  Making time for activities that help you relax and not feeling guilty about spending your time this way.  Medicines Your health care provider may suggest certain medicines if he or she feels that they will help improve your condition. Avoid using alcohol and other substances that may prevent your medicines from working properly (may interact). It is also important to:  Talk with your pharmacist or health care provider about all the medicines that you take, their possible side effects, and what medicines are safe to take together.  Make it your goal to take part in all treatment decisions (shared decision-making). This includes giving input on  the side effects of medicines. It is best if shared decision-making with your health care provider is part of your total treatment plan. If your health care provider prescribes a medicine, you may not notice the full benefits of it for 4-8 weeks. Most people who are treated for depression need to be on medicine for at least 6-12 months after they feel better. If you are taking  medicines as part of your treatment, do not stop taking medicines without first talking to your health care provider. You may need to have the medicine slowly decreased (tapered) over time to decrease the risk of harmful side effects. Relationships Your health care provider may suggest family therapy along with individual therapy and drug therapy. While there may not be family problems that are causing you to feel depressed, it is still important to make sure your family learns as much as they can about your mental health. Having your family's support can help make your treatment successful. How to recognize changes in your condition Everyone has a different response to treatment for depression. Recovery from major depression happens when you have not had signs of major depression for two months. This may mean that you will start to:  Have more interest in doing activities.  Feel less hopeless than you did 2 months ago.  Have more energy.  Overeat less often, or have better or improving appetite.  Have better concentration. Your health care provider will work with you to decide the next steps in your recovery. It is also important to recognize when your condition is getting worse. Watch for these signs:  Having fatigue or low energy.  Eating too much or too little.  Sleeping too much or too little.  Feeling restless, agitated, or hopeless.  Having trouble concentrating or making decisions.  Having unexplained physical complaints.  Feeling irritable, angry, or aggressive. Get help as soon as you or your family members notice these symptoms coming back. How to get support and help from others How to talk with friends and family members about your condition  Talking to friends and family members about your condition can provide you with one way to get support and guidance. Reach out to trusted friends or family members, explain your symptoms to them, and let them know that you are  working with a health care provider to treat your depression. Financial resources Not all insurance plans cover mental health care, so it is important to check with your insurance carrier. If paying for co-pays or counseling services is a problem, search for a local or county mental health care center. They may be able to offer public mental health care services at low or no cost when you are not able to see a private health care provider. If you are taking medicine for depression, you may be able to get the generic form, which may be less expensive. Some makers of prescription medicines also offer help to patients who cannot afford the medicines they need. Follow these instructions at home:   Get the right amount and quality of sleep.  Cut down on using caffeine, tobacco, alcohol, and other potentially harmful substances.  Try to exercise, such as walking or lifting small weights.  Take over-the-counter and prescription medicines only as told by your health care provider.  Eat a healthy diet that includes plenty of vegetables, fruits, whole grains, low-fat dairy products, and lean protein. Do not eat a lot of foods that are high in solid fats, added sugars, or salt.  Keep all follow-up visits as told by your health care provider. This is important. Contact a health care provider if:  You stop taking your antidepressant medicines, and you have any of these symptoms: ? Nausea. ? Headache. ? Feeling lightheaded. ? Chills and body aches. ? Not being able to sleep (insomnia).  You or your friends and family think your depression is getting worse. Get help right away if:  You have thoughts of hurting yourself or others. If you ever feel like you may hurt yourself or others, or have thoughts about taking your own life, get help right away. You can go to your nearest emergency department or call:  Your local emergency services (911 in the [place]  A suicide crisis helpline, such as the  [place] at 4380918458. This is open 24-hours a day. Summary  If you are living with depression, there are ways to help you recover from it and also ways to prevent it from coming back.  Work with your health care team to create a management plan that includes counseling, stress management techniques, and healthy lifestyle habits. This information is not intended to replace advice given to you by your health care provider. Make sure you discuss any questions you have with your health care provider. Document Released: 09/22/2016 Document Revised: 02/11/2019 Document Reviewed: 09/22/2016 Elsevier Patient Education  2020 [employer].

## 2019-07-05 IMAGING — DX DG KNEE STANDING AP BILAT
1 series · 1 of 1 positions shown · non-contrast
Comparison: None.

CLINICAL DATA: 46-year-old female with right knee pain for the past
week mainly anteriorly. No injury. Initial encounter.

EXAM:
BILATERAL KNEES STANDING - 1 VIEW; RIGHT KNEE - COMPLETE 4+ VIEW

[knee ap]
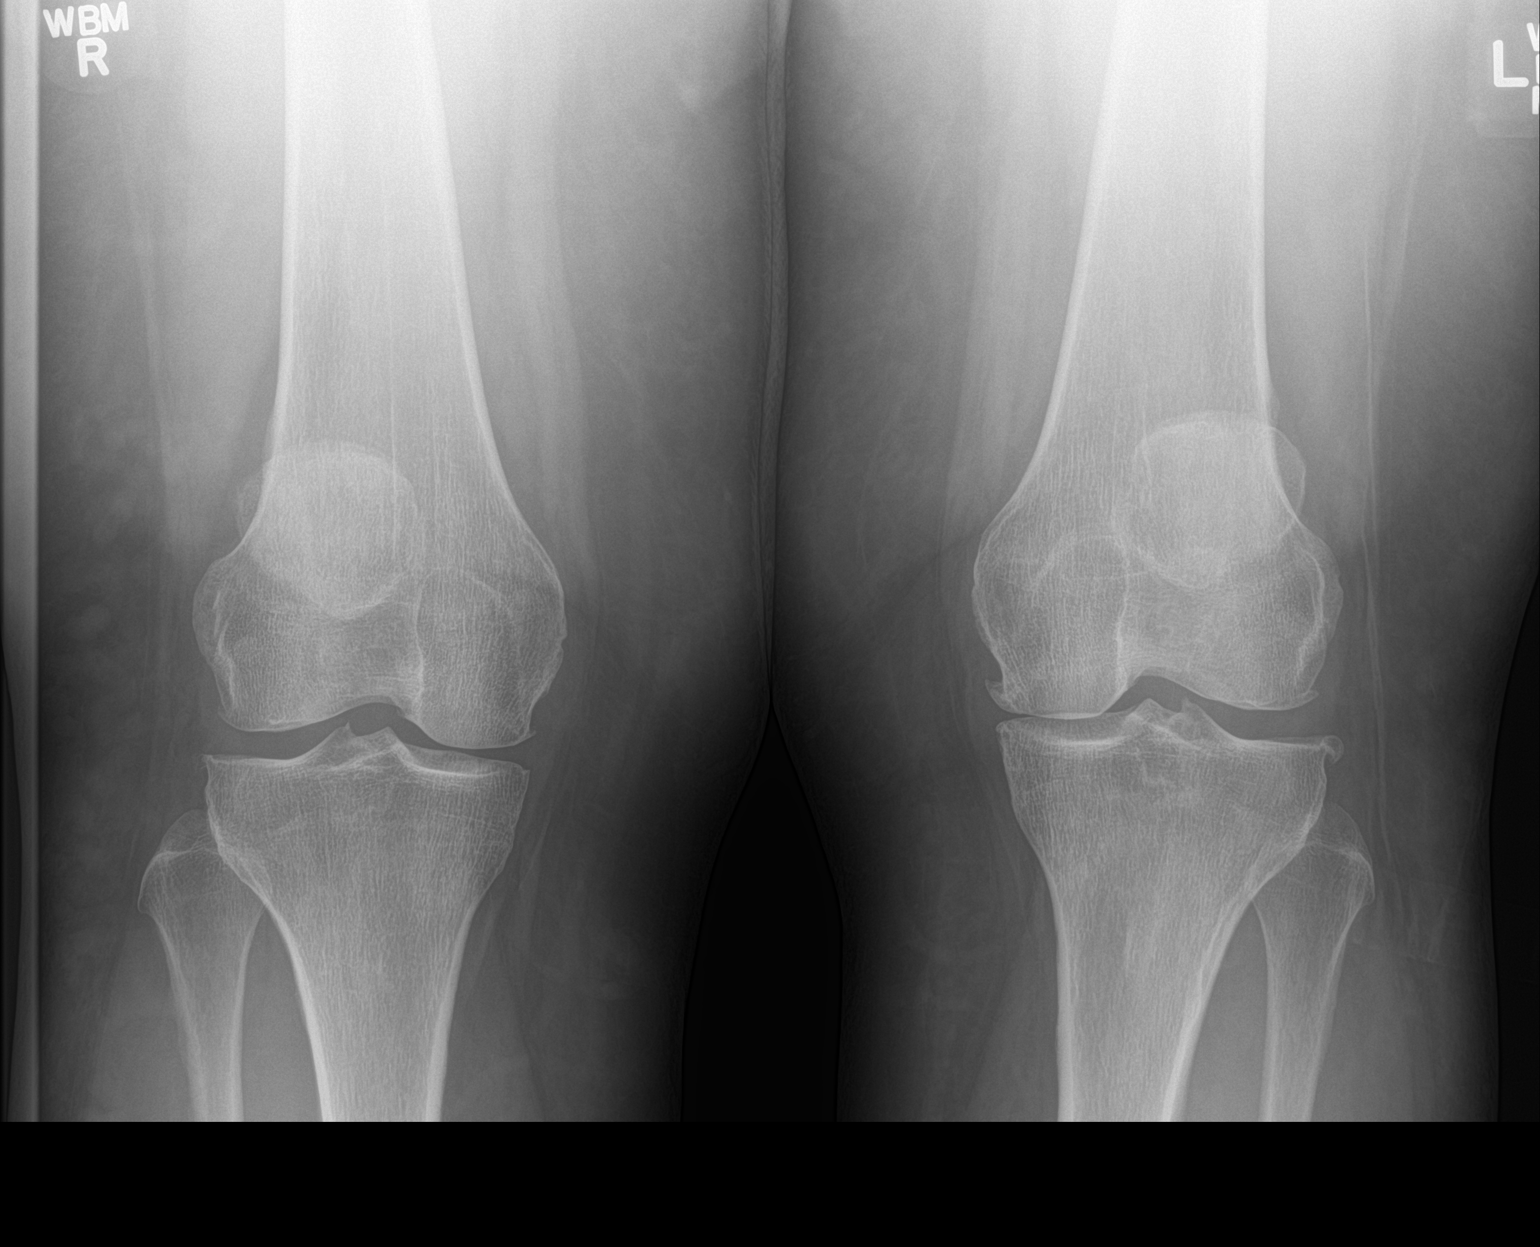

[1 of 1 positions shown; findings below may reference images not displayed]

FINDINGS: Right knee 6 views:

Mild to moderate patellofemoral joint degenerative changes.

Minimal medial tibiofemoral joint degenerative changes.

Very small suprapatellar joint effusion.

No fracture or dislocation.

Left knee single projection.

Mild medial tibiofemoral joint space narrowing. Small osteophyte
surrounds the medial and lateral tibiofemoral joint space.
IMPRESSION: Mild to moderate right patellofemoral joint degenerative changes.

Minimal right medial tibiofemoral joint degenerative changes.

Very small right suprapatellar joint effusion.

Mild left medial tibiofemoral joint space narrowing. Small
osteophyte surrounds the medial and lateral left tibiofemoral joint
space.

## 2019-07-05 IMAGING — DX DG KNEE COMPLETE 4+V*R*
5 series · 5 of 5 positions shown · non-contrast
Comparison: None.

CLINICAL DATA: 46-year-old female with right knee pain for the past
week mainly anteriorly. No injury. Initial encounter.

EXAM:
BILATERAL KNEES STANDING - 1 VIEW; RIGHT KNEE - COMPLETE 4+ VIEW

[knee ap]
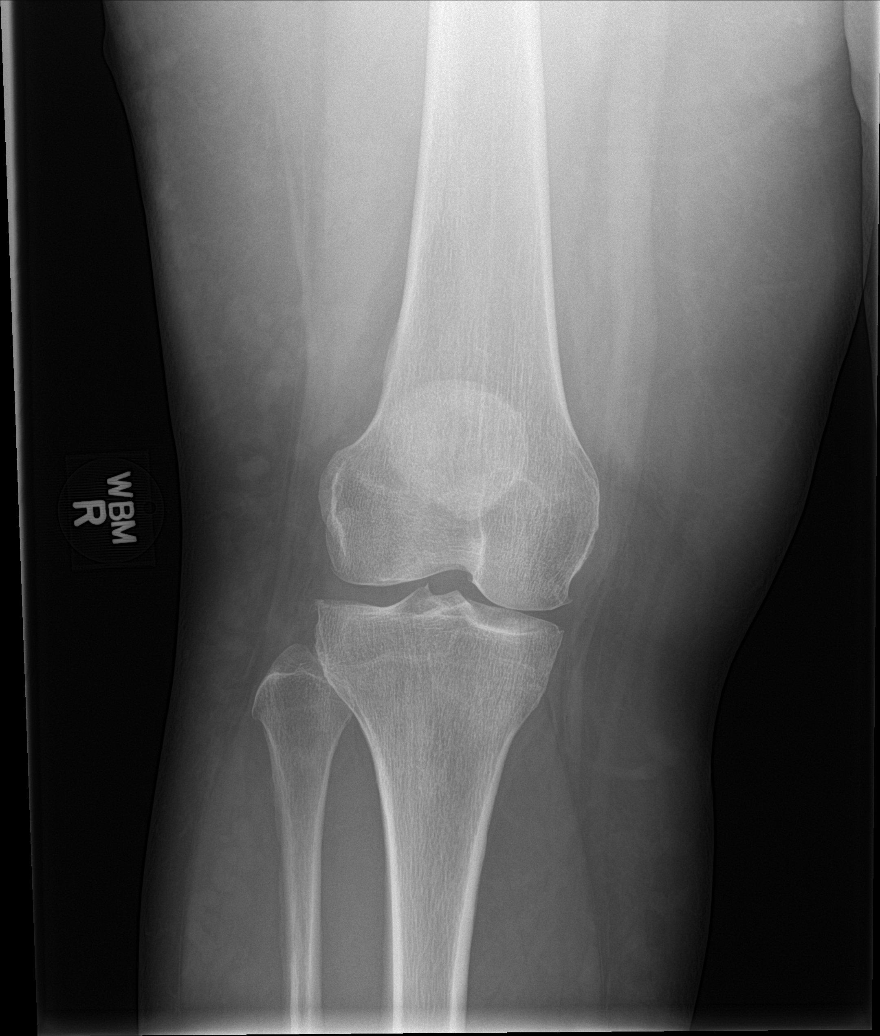

[knee lat]
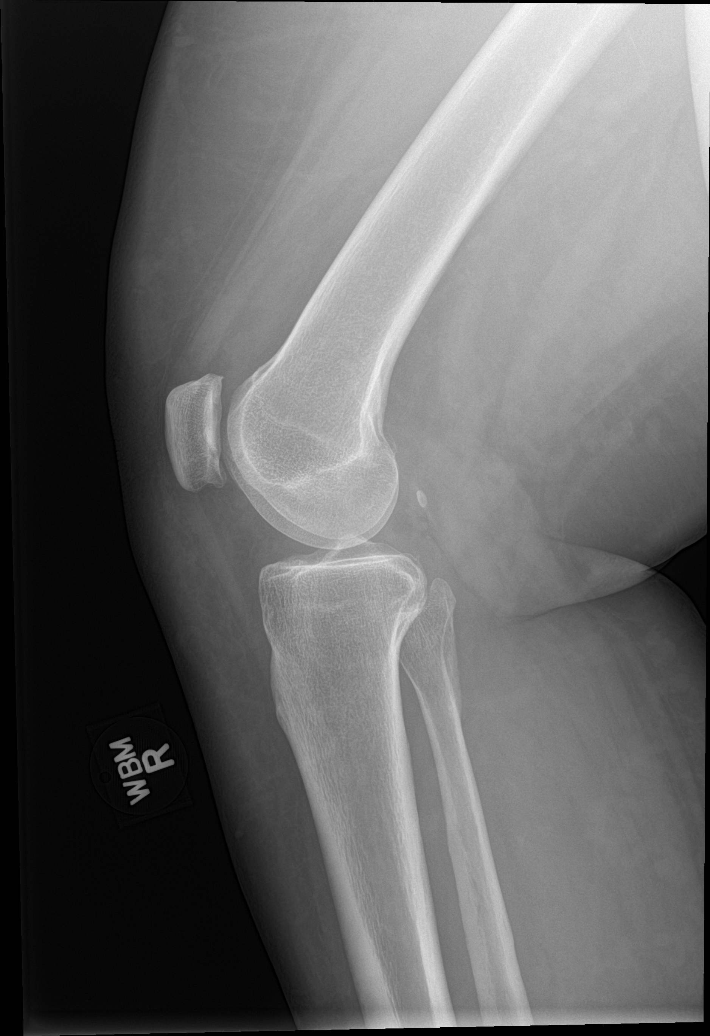

[patella skyline]
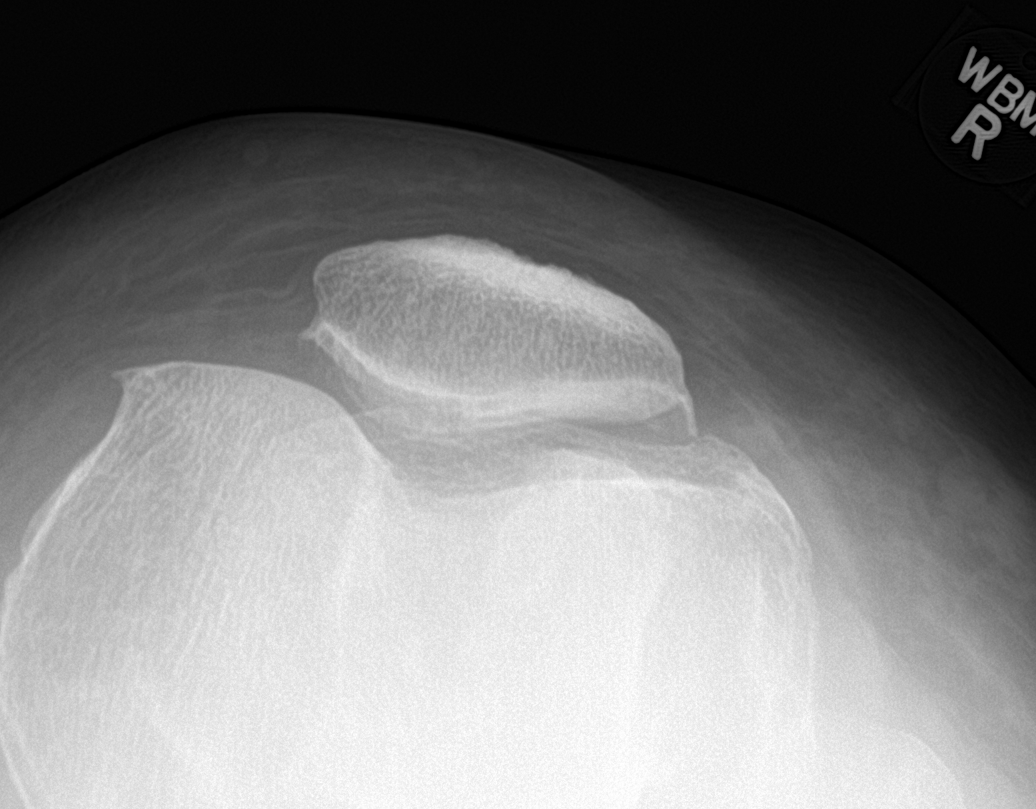

[knee obl (1 of 2)]
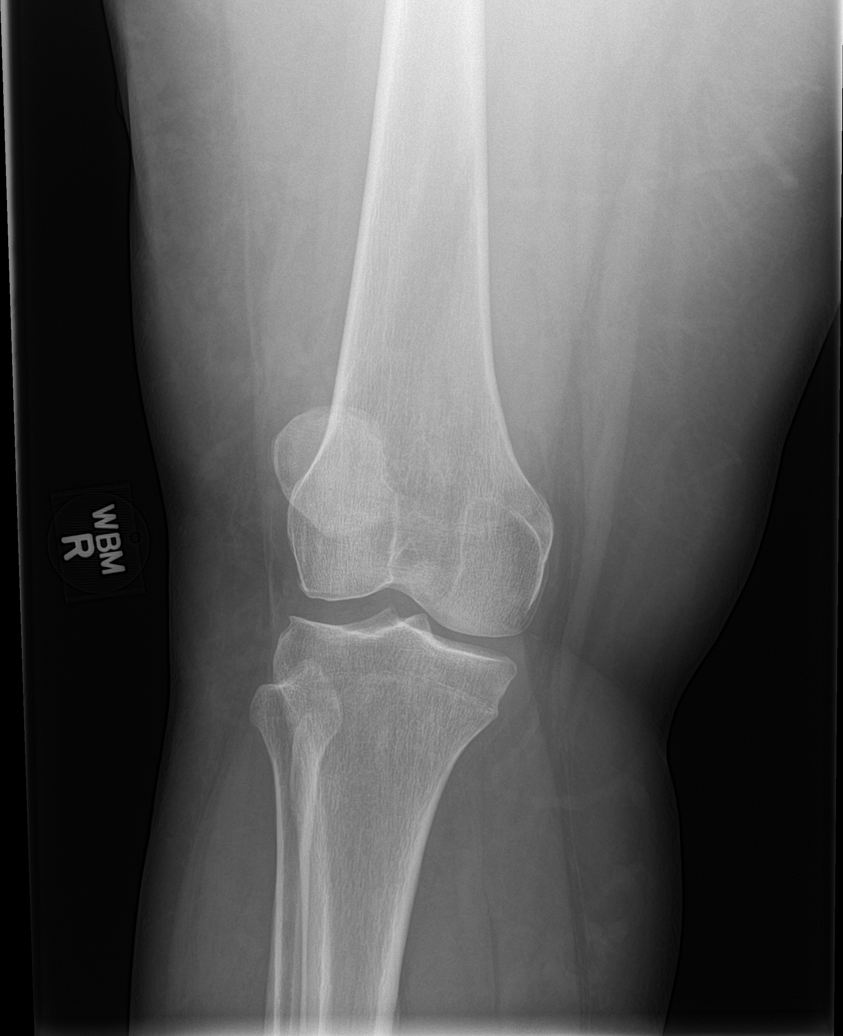

[knee obl (2 of 2)]
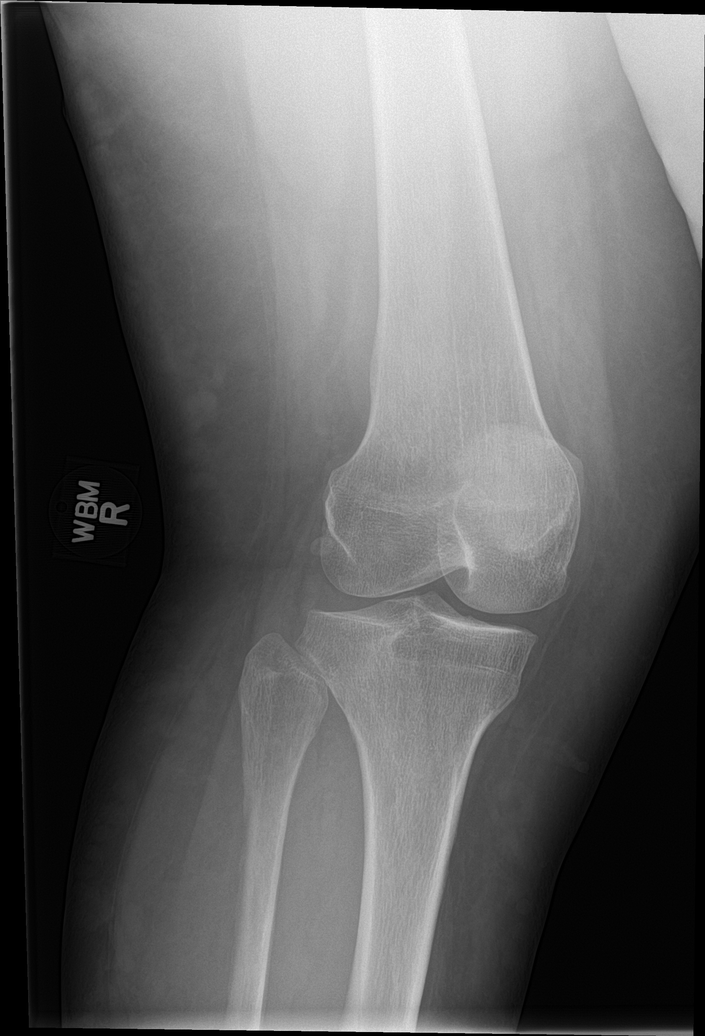

[5 of 5 positions shown; findings below may reference images not displayed]

FINDINGS: Right knee 6 views:

Mild to moderate patellofemoral joint degenerative changes.

Minimal medial tibiofemoral joint degenerative changes.

Very small suprapatellar joint effusion.

No fracture or dislocation.

Left knee single projection.

Mild medial tibiofemoral joint space narrowing. Small osteophyte
surrounds the medial and lateral tibiofemoral joint space.
IMPRESSION: Mild to moderate right patellofemoral joint degenerative changes.

Minimal right medial tibiofemoral joint degenerative changes.

Very small right suprapatellar joint effusion.

Mild left medial tibiofemoral joint space narrowing. Small
osteophyte surrounds the medial and lateral left tibiofemoral joint
space.

## 2019-08-03 ENCOUNTER — Ambulatory Visit (INDEPENDENT_AMBULATORY_CARE_PROVIDER_SITE_OTHER): Payer: BLUE CROSS/BLUE SHIELD | Admitting: Family Medicine

## 2019-08-03 ENCOUNTER — Encounter: Payer: Self-pay | Admitting: Family Medicine

## 2019-08-03 ENCOUNTER — Other Ambulatory Visit: Payer: Self-pay

## 2019-08-03 VITALS — BP 134/81 | HR 78 | Temp 98.4°F | Ht 67.5 in | Wt 293.0 lb

## 2019-08-03 DIAGNOSIS — R5383 Other fatigue: Secondary | ICD-10-CM | POA: Diagnosis not present

## 2019-08-03 DIAGNOSIS — R110 Nausea: Secondary | ICD-10-CM | POA: Diagnosis not present

## 2019-08-03 DIAGNOSIS — Z23 Encounter for immunization: Secondary | ICD-10-CM

## 2019-08-03 DIAGNOSIS — N926 Irregular menstruation, unspecified: Secondary | ICD-10-CM

## 2019-08-03 MED ORDER — LEVOTHYROXINE SODIUM 112 MCG PO TABS: 112.0000 ug | ORAL_TABLET | Freq: Every day | ORAL | 1 refills | Status: DC

## 2019-08-03 MED ORDER — LEVOCETIRIZINE DIHYDROCHLORIDE 5 MG PO TABS: 5.0000 mg | ORAL_TABLET | Freq: Every evening | ORAL | 5 refills | Status: DC

## 2019-08-03 NOTE — Patient Instructions (Signed)

## 2019-08-03 NOTE — Progress Notes (Signed)
BP 134/81 (BP Location: Left Arm, Patient Position: Sitting, Cuff Size: Normal)   Pulse 78   Temp 98.4 F (36.9 C) (Oral)   Ht 5' 7.5" (1.715 m)   Wt 293 lb (132.9 kg)   SpO2 94%   BMI 45.21 kg/m    Subjective:    Patient ID: Lynn French, female    DOB: 1971-01-04, 48 y.o.   MRN: [id number]  HPI: Lynn French is a 48 y.o. female  Chief Complaint  Patient presents with  . Nausea    Patient states she is still experiencing nausea.  . Menstrual Problem   Here today with several concerns. Still having occasional nausea but not as often as she was. Did start taking a mucinex daily which seems to help quite a bit. Thinks her nausea is mainly related to drainage. Now having 0-3 quick spells of nausea. Does have claritin generic at home but states it makes her super sleepy so she doesn't take it very often. Denies vomiting, diarrhea, abdominal pain, melena, fevers. Has carafate but never remembers to take it.   Still having fatigue and lack of motivation, wanting to see if her vitamin levels are adequate. Thyroid when last checked was high normal on current synthroid dose. Lexapro doing fairly well for her moods as far as she can tell. Denies SI/HI.   Had a period back in July that was 2 weeks of light spotting which is abnormal for her. Wondering if she's getting to menopause.   Depression screen [facility] 2/9 06/23/2019 02/07/2019 01/07/2019  Decreased Interest 3 3 3   Down, Depressed, Hopeless 0 0 1  PHQ - 2 Score 3 3 4   Altered sleeping 1 2 1   Tired, decreased energy 2 3 3   Change in appetite 1 2 3   Feeling bad or failure about yourself  0 1 1  Trouble concentrating 1 2 2   Moving slowly or fidgety/restless 0 0 0  Suicidal thoughts 0 0 0  PHQ-9 Score 8 13 14   Difficult doing work/chores Somewhat difficult Somewhat difficult -  Some recent data might be hidden   GAD 7 : Generalized Anxiety Score 06/23/2019 02/07/2019  Nervous, Anxious, on Edge 0 3  Control/stop worrying 0 2  Worry  too much - different things 1 2  Trouble relaxing 0 2  Restless 1 2  Easily annoyed or irritable 1 3  Afraid - awful might happen 0 1  Total GAD 7 Score 3 15  Anxiety Difficulty Somewhat difficult Somewhat difficult   Relevant past medical, surgical, family and social history reviewed and updated as indicated. Interim medical history since our last visit reviewed. Allergies and medications reviewed and updated.  Review of Systems  Per HPI unless specifically indicated above     Objective:    BP 134/81 (BP Location: Left Arm, Patient Position: Sitting, Cuff Size: Normal)   Pulse 78   Temp 98.4 F (36.9 C) (Oral)   Ht 5' 7.5" (1.715 m)   Wt 293 lb (132.9 kg)   SpO2 94%   BMI 45.21 kg/m   Wt Readings from Last 3 Encounters:  08/03/19 293 lb (132.9 kg)  01/07/19 298 lb 12.8 oz (135.5 kg)  09/20/18 (!) 302 lb 1 oz (137 kg)    Physical Exam Vitals signs and nursing note reviewed.  Constitutional:      Appearance: Normal appearance. She is not ill-appearing.  HENT:     Head: Atraumatic.  Eyes:     Extraocular Movements: Extraocular movements intact.  Conjunctiva/sclera: Conjunctivae normal.  Neck:     Musculoskeletal: Normal range of motion and neck supple.  Cardiovascular:     Rate and Rhythm: Normal rate and regular rhythm.     Heart sounds: Normal heart sounds.  Pulmonary:     Effort: Pulmonary effort is normal.     Breath sounds: Normal breath sounds.  Abdominal:     General: Bowel sounds are normal.     Palpations: Abdomen is soft.     Tenderness: There is no abdominal tenderness. There is no guarding.  Musculoskeletal: Normal range of motion.  Skin:    General: Skin is warm and dry.  Neurological:     Mental Status: She is alert and oriented to person, place, and time.  Psychiatric:        Mood and Affect: Mood normal.        Thought Content: Thought content normal.        Judgment: Judgment normal.     Results for orders placed or performed in visit  on 05/12/19  Novel Coronavirus, NAA ([facility])  Result Value Ref Range   SARS-CoV-2, NAA Not Detected Not Detected      Assessment & Plan:   Problem List Items Addressed This Visit    None    Visit Diagnoses    Fatigue, unspecified type    -  Primary   Increase synthroid, start vit D supplements (vit D last checked in 2018 and slightly low), and increase exercise. Recheck in 6 weeks at CPE   Relevant Orders   TSH   Vitamin D (25 hydroxy)   Nausea       Seems improved with reduction in post nasal drainage. Will increase allergy regimen and continue to monitor. F/u if not resolving   Abnormal menstrual periods       Likely in peri-menopause, discussed what to expect during this change. F/u if worsening issues   Need for influenza vaccination       Relevant Orders   Flu Vaccine QUAD 6+ mos PF IM (Fluarix Quad PF) (Completed)       Follow up plan: Return for CPE after 09/21/2019.

## 2019-08-28 ENCOUNTER — Other Ambulatory Visit: Payer: Self-pay | Admitting: Family Medicine

## 2019-09-22 ENCOUNTER — Other Ambulatory Visit: Payer: Self-pay

## 2019-09-22 DIAGNOSIS — Z20822 Contact with and (suspected) exposure to covid-19: Secondary | ICD-10-CM

## 2019-09-23 ENCOUNTER — Encounter: Payer: BLUE CROSS/BLUE SHIELD | Admitting: Family Medicine

## 2019-09-25 ENCOUNTER — Other Ambulatory Visit: Payer: Self-pay | Admitting: Family Medicine

## 2019-09-25 LAB — NOVEL CORONAVIRUS, NAA: SARS-CoV-2, NAA: DETECTED — AB

## 2019-09-26 ENCOUNTER — Telehealth: Payer: Self-pay

## 2019-09-26 NOTE — Telephone Encounter (Signed)
Pt given Covid-19 positive results. Discussed mild, moderate and severe symptoms. Advised pt to call 911 for any respiratory issues and/dehydration. Discussed non test criteria for ending self isolation. Pt advised of way to manage symptoms at home and review isolation precautions especially the importance of washing hands frequently and wearing a mask when around others. Pt verbalized understanding. Will report to [facility].

## 2019-09-26 NOTE — Telephone Encounter (Signed)
Requested medication (s) are due for refill today: yes  Requested medication (s) are on the active medication list: levothyroxine is but do not find Euthyrox  Last refill:  08/03/19  Future visit scheduled: no  Notes to clinic:  Pt's levothyroxine dose was increased from 167mg to 112 mcg. Looks like pt need repeat TSH.   Requested Prescriptions  Pending Prescriptions Disp Refills   EUTHYROX 112 MCG tablet [Pharmacy Med Name: Euthyrox 112 MCG Oral Tablet] 60 tablet 0    Sig: Take 1 tablet by mouth once daily     Endocrinology:  Hypothyroid Agents Failed - 09/25/2019  8:49 AM      Failed - TSH needs to be rechecked within 3 months after an abnormal result. Refill until TSH is due.      Passed - TSH in normal range and within 360 days    TSH  Date Value Ref Range Status  02/08/2019 4.140 0.450 - 4.500 uIU/mL Final         Passed - Valid encounter within last 12 months    Recent Outpatient Visits          [facility], [facility]  3 months ago Current 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 episode ([facility]   [place]   [place]  7 months ago Hypothyroidism, unspecified type   [place]  8 months ago Chronic gastritis without bleeding, unspecified [facility]ritis type   [facility]

## 2019-09-27 NOTE — Telephone Encounter (Signed)
Called pt to inform of Lynn French's message. Pt states that she has Covid at this time

## 2019-09-27 NOTE — Telephone Encounter (Signed)
She will need to come in for the labs once quarantine is up, if she needs another week or so of medicine she should let me know

## 2019-09-27 NOTE — Telephone Encounter (Signed)
Please get her a lab appt to recheck thyroid, will fill 2 weeks to bridge. She is overdue for this

## 2019-10-03 NOTE — Telephone Encounter (Signed)
Called pt to let her know of Rachel's message, no answer, left vm

## 2019-10-04 ENCOUNTER — Encounter: Payer: Self-pay | Admitting: Family Medicine

## 2019-10-10 ENCOUNTER — Ambulatory Visit: Payer: BLUE CROSS/BLUE SHIELD | Admitting: Family Medicine

## 2019-10-18 ENCOUNTER — Other Ambulatory Visit: Payer: Self-pay | Admitting: Family Medicine

## 2019-10-19 ENCOUNTER — Other Ambulatory Visit: Payer: Self-pay | Admitting: Family Medicine

## 2019-10-19 NOTE — Telephone Encounter (Signed)
Forwarding medication refill request to PCP for review. 

## 2019-11-02 ENCOUNTER — Other Ambulatory Visit: Payer: Self-pay

## 2019-11-02 ENCOUNTER — Ambulatory Visit (INDEPENDENT_AMBULATORY_CARE_PROVIDER_SITE_OTHER): Payer: BLUE CROSS/BLUE SHIELD | Admitting: Family Medicine

## 2019-11-02 ENCOUNTER — Encounter: Payer: Self-pay | Admitting: Family Medicine

## 2019-11-02 VITALS — BP 118/83 | HR 75 | Temp 98.8°F | Ht 67.0 in | Wt 296.0 lb

## 2019-11-02 DIAGNOSIS — Z0000 Encounter for general adult medical examination without abnormal findings: Secondary | ICD-10-CM | POA: Diagnosis not present

## 2019-11-02 DIAGNOSIS — E782 Mixed hyperlipidemia: Secondary | ICD-10-CM

## 2019-11-02 DIAGNOSIS — I10 Essential (primary) hypertension: Secondary | ICD-10-CM | POA: Diagnosis not present

## 2019-11-02 DIAGNOSIS — E039 Hypothyroidism, unspecified: Secondary | ICD-10-CM

## 2019-11-02 DIAGNOSIS — R7301 Impaired fasting glucose: Secondary | ICD-10-CM

## 2019-11-02 DIAGNOSIS — Z1239 Encounter for other screening for malignant neoplasm of breast: Secondary | ICD-10-CM

## 2019-11-02 DIAGNOSIS — F320 Major depressive disorder, single episode, mild: Secondary | ICD-10-CM

## 2019-11-02 LAB — UA/M W/RFLX CULTURE, ROUTINE
Bilirubin, UA: NEGATIVE
Glucose, UA: NEGATIVE
Ketones, UA: NEGATIVE
Leukocytes,UA: NEGATIVE
Nitrite, UA: NEGATIVE
Protein,UA: NEGATIVE
RBC, UA: NEGATIVE
Specific Gravity, UA: 1.01 (ref 1.005–1.030)
Urobilinogen, Ur: 1 mg/dL (ref 0.2–1.0)
pH, UA: 7 (ref 5.0–7.5)

## 2019-11-02 NOTE — Progress Notes (Signed)
BP 118/83   Pulse 75   Temp 98.8 F (37.1 C) (Oral)   Ht 5' 7"  (1.702 m)   Wt 296 lb (134.3 kg)   SpO2 95%   BMI 46.36 kg/m    Subjective:    Patient ID: Lynn French, female    DOB: November 08, 1970, 48 y.o.   MRN: [id number]  HPI: Lynn French is a 48 y.o. female presenting on 11/02/2019 for comprehensive medical examination. Current medical complaints include:see below  HTN - Taking her HCTZ faithfully without side effects. Denies CP, SOB, HAs, dizziness. Has not been exercising or following a particular diet.   Hypothyroid - Has not taken her synthroid in about a week but will be getting back on it. Tolerates well when taking it.   GERD - Taking the prilosec daily, with carafate prn. Feeling much better without abdominal pain, nausea, belching now that using PPI regularly. Trying to avoid stomach irritants like NSAIDs.   IFG - has not been careful with diet during pandemic with increased stress and less access to places to exercise. Does not check home BSs. No polyuria, polydipsia, polyphagia, hypoglycemic episodes.   Depression - Exacerbated by pandemic and other personal stressors, but feels her medication does help her manage things. Denies side effects, SI/HI.   She currently lives with: Menopausal Symptoms: no  Depression Screen done today and results listed below:  Depression screen [facility] 2/9 11/02/2019 08/03/2019 06/23/2019 02/07/2019 01/07/2019  Decreased Interest 2 3 3 3 3   Down, Depressed, Hopeless 1 0 0 0 1  PHQ - 2 Score 3 3 3 3 4   Altered sleeping 3 1 1 2 1   Tired, decreased energy 2 2 2 3 3   Change in appetite 1 2 1 2 3   Feeling bad or failure about yourself  1 0 0 1 1  Trouble concentrating 1 1 1 2 2   Moving slowly or fidgety/restless 0 0 0 0 0  Suicidal thoughts 0 0 0 0 0  PHQ-9 Score 11 9 8 13 14   Difficult doing work/chores - - Somewhat difficult Somewhat difficult -  Some recent data might be hidden    The patient does not have a history of falls. I did  complete a risk assessment for falls. A plan of care for falls was documented.   Past Medical History:  Past Medical History:  Diagnosis Date  . Acid reflux   . Anemia   . Anxiety   . Atypical squamous cells of undetermined significance (ASCUS) on Papanicolaou smear of cervix April 2016   HPV Negative, next pap due April 2017  . B12 deficiency 2000s   history of B12 deficiency  . Diabetes mellitus without complication ([place])   . Hyperlipidemia   . Hypothyroidism   . Obesity   . Tachycardia    with palpitations, negative work up.  . Tobacco use     Surgical History:  Past Surgical History:  Procedure Laterality Date  . APPENDECTOMY    . BLADDER SURGERY     bladder stem enlarged  . CESAREAN SECTION  11/17/11  . CHOLECYSTECTOMY    . TUBAL LIGATION  11/17/11  . TYMPANOSTOMY TUBE PLACEMENT    . WISDOM TOOTH EXTRACTION      Medications:  Current Outpatient Medications on File Prior to Visit  Medication Sig  . acetaminophen (TYLENOL) 500 MG tablet Take 1,000 mg by mouth every 8 (eight) hours as needed.  . Cyanocobalamin (VITAMIN B-12) 5000 MCG SUBL Place 5,000 mcg under the  tongue daily.  . diclofenac sodium (VOLTAREN) 1 % GEL Apply 2 g topically 4 (four) times daily.  Marland Kitchen escitalopram (LEXAPRO) 20 MG tablet Take 1 tablet (20 mg total) by mouth daily.  . hydrochlorothiazide (HYDRODIURIL) 25 MG tablet Take 2 tablets by mouth once daily  . levocetirizine (XYZAL) 5 MG tablet Take 1 tablet (5 mg total) by mouth every evening.  Marland Kitchen levothyroxine (SYNTHROID) 112 MCG tablet Take 1 tablet by mouth once daily  . Loratadine 10 MG CAPS Take 10 mg by mouth daily.   . mupirocin nasal ointment (BACTROBAN) 2 % Place 1 application into the nose 2 (two) times daily. As needed  . omeprazole (PRILOSEC) 40 MG capsule Take 1 capsule by mouth once daily  . sucralfate (CARAFATE) 1 g tablet Take 1 tablet (1 g total) by mouth 4 (four) times daily -  with meals and at bedtime.   No current  facility-administered medications on file prior to visit.    Allergies:  No Known Allergies  Social History:  Social History   Socioeconomic History  . Marital status: Single    Spouse name: Not on file  . Number of children: Not on file  . Years of education: Not on file  . Highest education level: Not on file  Occupational History  . Not on file  Tobacco Use  . Smoking status: Current Every Day Smoker    Packs/day: 0.50    Years: 20.00    Pack years: 10.00    Types: Cigarettes  . Smokeless tobacco: Never Used  Substance and Sexual Activity  . Alcohol use: No    Alcohol/week: 0.0 standard drinks    Comment: very rare  . Drug use: No  . Sexual activity: Not Currently  Other Topics Concern  . Not on file  Social History Narrative  . Not on file   Social Determinants of Health   Financial Resource Strain:   . Difficulty of Paying Living Expenses: Not on file  Food Insecurity:   . Worried About Charity fundraiser in the Last Year: Not on file  . Ran Out of Food in the Last Year: Not on file  Transportation Needs:   . Lack of Transportation (Medical): Not on file  . Lack of Transportation (Non-Medical): Not on file  Physical Activity:   . Days of Exercise per Week: Not on file  . Minutes of Exercise per Session: Not on file  Stress:   . Feeling of Stress : Not on file  Social Connections:   . Frequency of Communication with Friends and Family: Not on file  . Frequency of Social Gatherings with Friends and Family: Not on file  . Attends Religious Services: Not on file  . Active Member of Clubs or Organizations: Not on file  . Attends Archivist Meetings: Not on file  . Marital Status: Not on file  Intimate Partner Violence:   . Fear of Current or Ex-Partner: Not on file  . Emotionally Abused: Not on file  . Physically Abused: Not on file  . Sexually Abused: Not on file   Social History   Tobacco Use  Smoking Status Current Every Day Smoker  .  Packs/day: 0.50  . Years: 20.00  . Pack years: 10.00  . Types: Cigarettes  Smokeless Tobacco Never Used   Social History   Substance and Sexual Activity  Alcohol Use No  . Alcohol/week: 0.0 standard drinks   Comment: very rare    Family History:  Family  History  Problem Relation Age of Onset  . Mental illness Mother   . Cancer Maternal Grandmother        breast  . Allergies Son   . Eczema Son     Past medical history, surgical history, medications, allergies, family history and social history reviewed with patient today and changes made to appropriate areas of the chart.   Review of Systems - General ROS: negative Psychological ROS: negative Ophthalmic ROS: negative ENT ROS: negative Allergy and Immunology ROS: negative Hematological and Lymphatic ROS: negative Endocrine ROS: negative Breast ROS: negative for breast lumps Respiratory ROS: no cough, shortness of breath, or wheezing Cardiovascular ROS: no chest pain or dyspnea on exertion Gastrointestinal ROS: no abdominal pain, change in bowel habits, or black or bloody stools Genito-Urinary ROS: no dysuria, trouble voiding, or hematuria Musculoskeletal ROS: negative Neurological ROS: no TIA or stroke symptoms Dermatological ROS: negative All other ROS negative except what is listed above and in the HPI.      Objective:    BP 118/83   Pulse 75   Temp 98.8 F (37.1 C) (Oral)   Ht 5' 7"  (1.702 m)   Wt 296 lb (134.3 kg)   SpO2 95%   BMI 46.36 kg/m   Wt Readings from Last 3 Encounters:  11/02/19 296 lb (134.3 kg)  08/03/19 293 lb (132.9 kg)  01/07/19 298 lb 12.8 oz (135.5 kg)    Physical Exam Vitals and nursing note reviewed.  Constitutional:      General: She is not in acute distress.    Appearance: She is well-developed.  HENT:     Head: Atraumatic.     Right Ear: External ear normal.     Left Ear: External ear normal.     Nose: Nose normal.     Mouth/Throat:     Pharynx: No oropharyngeal exudate.   Eyes:     General: No scleral icterus.    Conjunctiva/sclera: Conjunctivae normal.     Pupils: Pupils are equal, round, and reactive to light.  Neck:     Thyroid: No thyromegaly.  Cardiovascular:     Rate and Rhythm: Normal rate and regular rhythm.     Heart sounds: Normal heart sounds.  Pulmonary:     Effort: Pulmonary effort is normal. No respiratory distress.     Breath sounds: Normal breath sounds.  Chest:     Breasts:        Right: No mass, skin change or tenderness.        Left: No mass, skin change or tenderness.  Abdominal:     General: Bowel sounds are normal.     Palpations: Abdomen is soft. There is no mass.     Tenderness: There is no abdominal tenderness.  Genitourinary:    Comments: GU exam declined Musculoskeletal:        General: No tenderness. Normal range of motion.     Cervical back: Normal range of motion and neck supple.  Lymphadenopathy:     Cervical: No cervical adenopathy.     Upper Body:     Right upper body: No axillary adenopathy.     Left upper body: No axillary adenopathy.  Skin:    General: Skin is warm and dry.     Findings: No rash.  Neurological:     Mental Status: She is alert and oriented to person, place, and time.     Cranial Nerves: No cranial nerve deficit.  Psychiatric:        Behavior:  Behavior normal.     Results for orders placed or performed in visit on 11/02/19  UA/M w/rflx Culture, Routine   Specimen: Urine   URINE  Result Value Ref Range   Specific Gravity, UA 1.010 1.005 - 1.030   pH, UA 7.0 5.0 - 7.5   Color, UA Yellow Yellow   Appearance Ur Clear Clear   Leukocytes,UA Negative Negative   Protein,UA Negative Negative/Trace   Glucose, UA Negative Negative   Ketones, UA Negative Negative   RBC, UA Negative Negative   Bilirubin, UA Negative Negative   Urobilinogen, Ur 1.0 0.2 - 1.0 mg/dL   Nitrite, UA Negative Negative  CBC with Differential/Platelet out  Result Value Ref Range   WBC 8.5 3.4 - 10.8 x10E3/uL    RBC 4.89 3.77 - 5.28 x10E6/uL   Hemoglobin 12.6 11.1 - 15.9 g/dL   Hematocrit 39.6 34.0 - 46.6 %   MCV 81 79 - 97 fL   MCH 25.8 (L) 26.6 - 33.0 pg   MCHC 31.8 31.5 - 35.7 g/dL   RDW 15.9 (H) 11.7 - 15.4 %   Platelets 341 150 - 450 x10E3/uL   Neutrophils 65 Not Estab. %   Lymphs 25 Not Estab. %   Monocytes 5 Not Estab. %   Eos 4 Not Estab. %   Basos 1 Not Estab. %   Neutrophils Absolute 5.6 1.4 - 7.0 x10E3/uL   Lymphocytes Absolute 2.1 0.7 - 3.1 x10E3/uL   Monocytes Absolute 0.4 0.1 - 0.9 x10E3/uL   EOS (ABSOLUTE) 0.4 0.0 - 0.4 x10E3/uL   Basophils Absolute 0.1 0.0 - 0.2 x10E3/uL   Immature Granulocytes 0 Not Estab. %   Immature Grans (Abs) 0.0 0.0 - 0.1 x10E3/uL  Comprehensive metabolic panel  Result Value Ref Range   Glucose 100 (H) 65 - 99 mg/dL   BUN 7 6 - 24 mg/dL   Creatinine, Ser 0.68 0.57 - 1.00 mg/dL   GFR calc non Af Amer 105 >59 mL/min/1.73   GFR calc Af Amer 121 >59 mL/min/1.73   BUN/Creatinine Ratio 10 9 - 23   Sodium 139 134 - 144 mmol/L   Potassium 4.1 3.5 - 5.2 mmol/L   Chloride 97 96 - 106 mmol/L   CO2 26 20 - 29 mmol/L   Calcium 9.1 8.7 - 10.2 mg/dL   Total Protein 7.2 6.0 - 8.5 g/dL   Albumin 4.1 3.8 - 4.8 g/dL   Globulin, Total 3.1 1.5 - 4.5 g/dL   Albumin/Globulin Ratio 1.3 1.2 - 2.2   Bilirubin Total 0.3 0.0 - 1.2 mg/dL   Alkaline Phosphatase 116 39 - 117 IU/L   AST 40 0 - 40 IU/L   ALT 16 0 - 32 IU/L  Lipid Panel w/o Chol/HDL Ratio out  Result Value Ref Range   Cholesterol, Total 203 (H) 100 - 199 mg/dL   Triglycerides 182 (H) 0 - 149 mg/dL   HDL 36 (L) >39 mg/dL   VLDL Cholesterol Cal 33 5 - 40 mg/dL   LDL Chol Calc (NIH) 134 (H) 0 - 99 mg/dL  TSH  Result Value Ref Range   TSH 15.400 (H) 0.450 - 4.500 uIU/mL  HgB A1c  Result Value Ref Range   Hgb A1c MFr Bld 6.0 (H) 4.8 - 5.6 %   Est. average glucose Bld gHb Est-mCnc 126 mg/dL      Assessment & Plan:   Problem List Items Addressed This Visit      Cardiovascular and Mediastinum    Benign hypertension  BPs stable and under good control. Continue current regimen      Relevant Orders   UA/M w/rflx Culture, Routine (Completed)   CBC with Differential/Platelet out (Completed)   Comprehensive metabolic panel (Completed)     Endocrine   Hypothyroid - Primary    Recheck TSH, will likely need to repeat in about a month as she's been off the medicine for over a week now. Discussed importance of consistent use      Relevant Orders   TSH (Completed)   IFG (impaired fasting glucose)    Will check labs and work hard on diet and exercise modifications for improvement      Relevant Orders   HgB A1c (Completed)     Other   Hyperlipidemia    Recheck lipids, continue working on diet and exercise changes      Relevant Orders   Lipid Panel w/o Chol/HDL Ratio out (Completed)   Depression    Well controlled on lexapro despite significant stressors. COntinue current regimen       Other Visit Diagnoses    Annual physical exam       Encounter for screening for malignant neoplasm of breast, unspecified screening modality       Relevant Orders   MM Digital Screening       Follow up plan: Return in about 6 months (around 05/02/2020) for 6 month f/u.   LABORATORY TESTING:  - Pap smear: up to date  IMMUNIZATIONS:   - Tdap: Tetanus vaccination status reviewed: last tetanus booster within 10 years. - Influenza: Up to date - Pneumovax: Not applicable - Prevnar: Not applicable - HPV: Not applicable - Zostavax vaccine: Not applicable  SCREENING: -Mammogram: Ordered today   PATIENT COUNSELING:   Advised to take 1 mg of folate supplement per day if capable of pregnancy.   Sexuality: Discussed sexually transmitted diseases, partner selection, use of condoms, avoidance of unintended pregnancy  and contraceptive alternatives.   Advised to avoid cigarette smoking.  I discussed with the patient that most people either abstain from alcohol or drink within safe limits  (<=14/week and <=4 drinks/occasion for males, <=7/weeks and <= 3 drinks/occasion for females) and that the risk for alcohol disorders and other health effects rises proportionally with the number of drinks per week and how often a drinker exceeds daily limits.  Discussed cessation/primary prevention of drug use and availability of treatment for abuse.   Diet: Encouraged to adjust caloric intake to maintain  or achieve ideal body weight, to reduce intake of dietary saturated fat and total fat, to limit sodium intake by avoiding high sodium foods and not adding table salt, and to maintain adequate dietary potassium and calcium preferably from fresh fruits, vegetables, and low-fat dairy products.    stressed the importance of regular exercise  Injury prevention: Discussed safety belts, safety helmets, smoke detector, smoking near bedding or upholstery.   Dental health: Discussed importance of regular tooth brushing, flossing, and dental visits.    NEXT PREVENTATIVE PHYSICAL DUE IN 1 YEAR. Return in about 6 months (around 05/02/2020) for 6 month f/u.

## 2019-11-02 NOTE — Patient Instructions (Addendum)
Please call this number to schedule your mammogram. [phone]  New order has been placed

## 2019-11-03 LAB — COMPREHENSIVE METABOLIC PANEL
ALT: 16 IU/L (ref 0–32)
AST: 40 IU/L (ref 0–40)
Albumin/Globulin Ratio: 1.3 (ref 1.2–2.2)
Albumin: 4.1 g/dL (ref 3.8–4.8)
Alkaline Phosphatase: 116 IU/L (ref 39–117)
BUN/Creatinine Ratio: 10 (ref 9–23)
BUN: 7 mg/dL (ref 6–24)
Bilirubin Total: 0.3 mg/dL (ref 0.0–1.2)
CO2: 26 mmol/L (ref 20–29)
Calcium: 9.1 mg/dL (ref 8.7–10.2)
Chloride: 97 mmol/L (ref 96–106)
Creatinine, Ser: 0.68 mg/dL (ref 0.57–1.00)
GFR calc Af Amer: 121 mL/min/{1.73_m2} (ref >59)
GFR calc non Af Amer: 105 mL/min/{1.73_m2} (ref >59)
Globulin, Total: 3.1 g/dL (ref 1.5–4.5)
Glucose: 100 mg/dL — ABNORMAL HIGH (ref 65–99)
Potassium: 4.1 mmol/L (ref 3.5–5.2)
Sodium: 139 mmol/L (ref 134–144)
Total Protein: 7.2 g/dL (ref 6.0–8.5)

## 2019-11-03 LAB — CBC WITH DIFFERENTIAL/PLATELET
Basophils Absolute: 0.1 10*3/uL (ref 0.0–0.2)
Basos: 1 %
EOS (ABSOLUTE): 0.4 10*3/uL (ref 0.0–0.4)
Eos: 4 %
Hematocrit: 39.6 % (ref 34.0–46.6)
Hemoglobin: 12.6 g/dL (ref 11.1–15.9)
Immature Grans (Abs): 0 10*3/uL (ref 0.0–0.1)
Immature Granulocytes: 0 %
Lymphocytes Absolute: 2.1 10*3/uL (ref 0.7–3.1)
Lymphs: 25 %
MCH: 25.8 pg — ABNORMAL LOW (ref 26.6–33.0)
MCHC: 31.8 g/dL (ref 31.5–35.7)
MCV: 81 fL (ref 79–97)
Monocytes Absolute: 0.4 10*3/uL (ref 0.1–0.9)
Monocytes: 5 %
Neutrophils Absolute: 5.6 10*3/uL (ref 1.4–7.0)
Neutrophils: 65 %
Platelets: 341 10*3/uL (ref 150–450)
RBC: 4.89 x10E6/uL (ref 3.77–5.28)
RDW: 15.9 % — ABNORMAL HIGH (ref 11.7–15.4)
WBC: 8.5 10*3/uL (ref 3.4–10.8)

## 2019-11-03 LAB — LIPID PANEL W/O CHOL/HDL RATIO
Cholesterol, Total: 203 mg/dL — ABNORMAL HIGH (ref 100–199)
HDL: 36 mg/dL — ABNORMAL LOW (ref >39)
LDL Chol Calc (NIH): 134 mg/dL — ABNORMAL HIGH (ref 0–99)
Triglycerides: 182 mg/dL — ABNORMAL HIGH (ref 0–149)
VLDL Cholesterol Cal: 33 mg/dL (ref 5–40)

## 2019-11-03 LAB — HEMOGLOBIN A1C
Est. average glucose Bld gHb Est-mCnc: 126 mg/dL
Hgb A1c MFr Bld: 6 % — ABNORMAL HIGH (ref 4.8–5.6)

## 2019-11-03 LAB — TSH: TSH: 15.4 u[IU]/mL — ABNORMAL HIGH (ref 0.450–4.500)

## 2019-11-07 ENCOUNTER — Other Ambulatory Visit: Payer: Self-pay | Admitting: Family Medicine

## 2019-11-07 DIAGNOSIS — E039 Hypothyroidism, unspecified: Secondary | ICD-10-CM

## 2019-11-08 NOTE — Assessment & Plan Note (Signed)
Recheck lipids, continue working on diet and exercise changes

## 2019-11-08 NOTE — Assessment & Plan Note (Signed)
Well controlled on lexapro despite significant stressors. COntinue current regimen

## 2019-11-08 NOTE — Assessment & Plan Note (Signed)
BPs stable and under good control. Continue current regimen

## 2019-11-08 NOTE — Assessment & Plan Note (Signed)
Recheck TSH, will likely need to repeat in about a month as she's been off the medicine for over a week now. Discussed importance of consistent use

## 2019-11-08 NOTE — Assessment & Plan Note (Signed)
Will check labs and work hard on diet and exercise modifications for improvement

## 2019-11-25 ENCOUNTER — Other Ambulatory Visit: Payer: Self-pay | Admitting: Family Medicine

## 2019-12-01 ENCOUNTER — Emergency Department
Admission: EM | Admit: 2019-12-01 | Discharge: 2019-12-01 | Disposition: A | Discharge status: Home / Self Care | Payer: Self-pay | Attending: Student | Admitting: Student

## 2019-12-01 ENCOUNTER — Other Ambulatory Visit: Payer: Self-pay

## 2019-12-01 ENCOUNTER — Emergency Department: Payer: Self-pay

## 2019-12-01 ENCOUNTER — Encounter: Payer: Self-pay | Admitting: Emergency Medicine

## 2019-12-01 DIAGNOSIS — R109 Unspecified abdominal pain: Secondary | ICD-10-CM

## 2019-12-01 DIAGNOSIS — R197 Diarrhea, unspecified: Secondary | ICD-10-CM

## 2019-12-01 DIAGNOSIS — Z79899 Other long term (current) drug therapy: Secondary | ICD-10-CM | POA: Insufficient documentation

## 2019-12-01 DIAGNOSIS — R112 Nausea with vomiting, unspecified: Secondary | ICD-10-CM | POA: Insufficient documentation

## 2019-12-01 DIAGNOSIS — E039 Hypothyroidism, unspecified: Secondary | ICD-10-CM | POA: Insufficient documentation

## 2019-12-01 DIAGNOSIS — F17210 Nicotine dependence, cigarettes, uncomplicated: Secondary | ICD-10-CM | POA: Insufficient documentation

## 2019-12-01 DIAGNOSIS — E119 Type 2 diabetes mellitus without complications: Secondary | ICD-10-CM | POA: Insufficient documentation

## 2019-12-01 DIAGNOSIS — Z791 Long term (current) use of non-steroidal anti-inflammatories (NSAID): Secondary | ICD-10-CM | POA: Insufficient documentation

## 2019-12-01 DIAGNOSIS — K529 Noninfective gastroenteritis and colitis, unspecified: Secondary | ICD-10-CM | POA: Insufficient documentation

## 2019-12-01 LAB — URINALYSIS, COMPLETE (UACMP) WITH MICROSCOPIC
Bacteria, UA: NONE SEEN
Bilirubin Urine: NEGATIVE
Glucose, UA: NEGATIVE mg/dL
Hgb urine dipstick: NEGATIVE
Ketones, ur: NEGATIVE mg/dL
Leukocytes,Ua: NEGATIVE
Nitrite: NEGATIVE
Protein, ur: NEGATIVE mg/dL
Specific Gravity, Urine: 1.011 (ref 1.005–1.030)
pH: 6 (ref 5.0–8.0)

## 2019-12-01 LAB — COMPREHENSIVE METABOLIC PANEL
ALT: 22 U/L (ref 0–44)
AST: 36 U/L (ref 15–41)
Albumin: 3.9 g/dL (ref 3.5–5.0)
Alkaline Phosphatase: 113 U/L (ref 38–126)
Anion gap: 9 (ref 5–15)
BUN: 9 mg/dL (ref 6–20)
CO2: 28 mmol/L (ref 22–32)
Calcium: 9.3 mg/dL (ref 8.9–10.3)
Chloride: 100 mmol/L (ref 98–111)
Creatinine, Ser: 0.75 mg/dL (ref 0.44–1.00)
GFR calc Af Amer: >60 mL/min (ref >60)
GFR calc non Af Amer: >60 mL/min (ref >60)
Glucose, Bld: 121 mg/dL — ABNORMAL HIGH (ref 70–99)
Potassium: 4.2 mmol/L (ref 3.5–5.1)
Sodium: 137 mmol/L (ref 135–145)
Total Bilirubin: 0.6 mg/dL (ref 0.3–1.2)
Total Protein: 8.6 g/dL — ABNORMAL HIGH (ref 6.5–8.1)

## 2019-12-01 LAB — POCT PREGNANCY, URINE: Preg Test, Ur: NEGATIVE

## 2019-12-01 LAB — LIPASE, BLOOD: Lipase: 19 U/L (ref 11–51)

## 2019-12-01 LAB — CBC
HCT: 44.3 % (ref 36.0–46.0)
Hemoglobin: 13.7 g/dL (ref 12.0–15.0)
MCH: 25.3 pg — ABNORMAL LOW (ref 26.0–34.0)
MCHC: 30.9 g/dL (ref 30.0–36.0)
MCV: 81.7 fL (ref 80.0–100.0)
Platelets: 355 10*3/uL (ref 150–400)
RBC: 5.42 MIL/uL — ABNORMAL HIGH (ref 3.87–5.11)
RDW: 17.6 % — ABNORMAL HIGH (ref 11.5–15.5)
WBC: 16.8 10*3/uL — ABNORMAL HIGH (ref 4.0–10.5)
nRBC: 0 % (ref 0.0–0.2)

## 2019-12-01 MED ORDER — SODIUM CHLORIDE 0.9 % IV BOLUS
1000.0000 mL | Freq: Once | INTRAVENOUS | Status: AC
  Administered 2019-12-01: 1000 mL via INTRAVENOUS

## 2019-12-01 MED ORDER — ONDANSETRON HCL 4 MG/2ML IJ SOLN
4.0000 mg | Freq: Once | INTRAMUSCULAR | Status: AC
  Administered 2019-12-01: 4 mg via INTRAVENOUS
  Filled 2019-12-01: qty 2

## 2019-12-01 MED ORDER — IOHEXOL 9 MG/ML PO SOLN
1000.0000 mL | Freq: Once | ORAL | Status: AC | PRN
  Administered 2019-12-01: 1000 mL via ORAL

## 2019-12-01 MED ORDER — ONDANSETRON 4 MG PO TBDP: 4.0000 mg | ORAL_TABLET | Freq: Three times a day (TID) | ORAL | 0 refills | Status: DC | PRN

## 2019-12-01 MED ORDER — IOHEXOL 300 MG/ML  SOLN
100.0000 mL | Freq: Once | INTRAMUSCULAR | Status: AC | PRN
  Administered 2019-12-01: 20:00:00 100 mL via INTRAVENOUS

## 2019-12-01 NOTE — Consult Note (Signed)
SURGICAL CONSULTATION NOTE   HISTORY OF PRESENT ILLNESS (HPI):  49 y.o. female presented to [facility] ED for evaluation of abdominal pain, nausea and diarrhea. Patient reports pain mostly on the left lower quadrant since earlier today.  Patient denies any pain radiation.  Patient endorses having symptom of" IBS ".  The patient reported that she has frequent nausea and diarrhea but usually now with significant abdominal pain.  She reported that since she got to the ED the pain has significantly improved.  She denies any nausea or vomiting at the moment of my evaluation.  I was contacted by Dr. Joan Mayans for evaluation of the CT scan of this patient.  CT scan of the abdomen and pelvis was done by ED physician.  CT scan showed incisional ventral hernia with small bowel.  There was a concern of small bowel incarceration and strangulation due to fluid on the hernia sac and small bowel wall thickening on the images.  I personally evaluated the images.  The contrast passed through the small bowel all the way to the large intestine.  Upon my evaluation and my physical exam the patient has no pain at all.  I did press her abdomen on the area of the hernia hardly and there was no induced abdominal pain.  Patient does report that she has a large knot on that area at times but usually goes away.  She did not know that she had an incisional hernia.  Surgery is consulted by Dr. Joan Mayans in this context for evaluation and management of incisional hernia.  PAST MEDICAL HISTORY (PMH):  Past Medical History:  Diagnosis Date  . Acid reflux   . Anemia   . Anxiety   . Atypical squamous cells of undetermined significance (ASCUS) on Papanicolaou smear of cervix April 2016   HPV Negative, next pap due April 2017  . B12 deficiency 2000s   history of B12 deficiency  . Diabetes mellitus without complication ([place])   . Hyperlipidemia   . Hypothyroidism   . Obesity   . Tachycardia    with palpitations, negative work up.  .  Tobacco use      PAST SURGICAL HISTORY ([place]):  Past Surgical History:  Procedure Laterality Date  . APPENDECTOMY    . BLADDER SURGERY     bladder stem enlarged  . CESAREAN SECTION  11/17/11  . CHOLECYSTECTOMY    . TUBAL LIGATION  11/17/11  . TYMPANOSTOMY TUBE PLACEMENT    . WISDOM TOOTH EXTRACTION       MEDICATIONS:  Prior to Admission medications   Medication Sig Start Date End Date Taking? Authorizing Provider  acetaminophen (TYLENOL) 500 MG tablet Take 1,000 mg by mouth every 8 (eight) hours as needed.    [provider]  Cyanocobalamin (VITAMIN B-12) 5000 MCG SUBL Place 5,000 mcg under the tongue daily.    [provider]  diclofenac sodium (VOLTAREN) 1 % GEL Apply 2 g topically 4 (four) times daily. 01/07/19   Volney American, PA-C  escitalopram (LEXAPRO) 20 MG tablet Take 1 tablet (20 mg total) by mouth daily. 06/23/19   Marnee Guarneri T, NP  hydrochlorothiazide (HYDRODIURIL) 25 MG tablet Take 2 tablets by mouth once daily 06/21/19   Volney American, PA-C  levocetirizine (XYZAL) 5 MG tablet Take 1 tablet (5 mg total) by mouth every evening. 08/03/19   Volney American, PA-C  levothyroxine (SYNTHROID) 112 MCG tablet Take 1 tablet by mouth once daily 11/25/19   Volney American, PA-C  Loratadine  10 MG CAPS Take 10 mg by mouth daily.     [provider]  mupirocin nasal ointment (BACTROBAN) 2 % Place 1 application into the nose 2 (two) times daily. As needed    [provider]  omeprazole (PRILOSEC) 40 MG capsule Take 1 capsule by mouth once daily 11/25/19   Volney American, PA-C  ondansetron (ZOFRAN ODT) 4 MG disintegrating tablet Take 1 tablet (4 mg total) by mouth every 8 (eight) hours as needed for nausea or vomiting. 12/01/19   Harvest Dark, MD  sucralfate (CARAFATE) 1 g tablet Take 1 tablet (1 g total) by mouth 4 (four) times daily -  with meals and at bedtime. 02/07/19   Volney American, PA-C      ALLERGIES:  No Known Allergies   SOCIAL HISTORY:  Social History   Socioeconomic History  . Marital status: Single    Spouse name: Not on file  . Number of children: Not on file  . Years of education: Not on file  . Highest education level: Not on file  Occupational History  . Not on file  Tobacco Use  . Smoking status: Current Every Day Smoker    Packs/day: 0.50    Years: 20.00    Pack years: 10.00    Types: Cigarettes  . Smokeless tobacco: Never Used  Substance and Sexual Activity  . Alcohol use: No    Alcohol/week: 0.0 standard drinks    Comment: very rare  . Drug use: No  . Sexual activity: Not Currently  Other Topics Concern  . Not on file  Social History Narrative  . Not on file   Social Determinants of Health   Financial Resource Strain:   . Difficulty of Paying Living Expenses: Not on file  Food Insecurity:   . Worried About Charity fundraiser in the Last Year: Not on file  . Ran Out of Food in the Last Year: Not on file  Transportation Needs:   . Lack of Transportation (Medical): Not on file  . Lack of Transportation (Non-Medical): Not on file  Physical Activity:   . Days of Exercise per Week: Not on file  . Minutes of Exercise per Session: Not on file  Stress:   . Feeling of Stress : Not on file  Social Connections:   . Frequency of Communication with Friends and Family: Not on file  . Frequency of Social Gatherings with Friends and Family: Not on file  . Attends Religious Services: Not on file  . Active Member of Clubs or Organizations: Not on file  . Attends Archivist Meetings: Not on file  . Marital Status: Not on file  Intimate Partner Violence:   . Fear of Current or Ex-Partner: Not on file  . Emotionally Abused: Not on file  . Physically Abused: Not on file  . Sexually Abused: Not on file    The patient currently resides (home / rehab facility / nursing home): Home The patient normally is (ambulatory / bedbound): Ambulatory    FAMILY HISTORY:  Family History  Problem Relation Age of Onset  . Mental illness Mother   . Cancer Maternal Grandmother        breast  . Allergies Son   . Eczema Son      REVIEW OF SYSTEMS:  Constitutional: denies weight loss, fever, chills, or sweats  Eyes: denies any other vision changes, history of eye injury  ENT: denies sore throat, hearing problems  Respiratory: denies shortness of breath,  wheezing  Cardiovascular: denies chest pain, palpitations  Gastrointestinal: Positive abdominal pain, N/V, and diarrhea Genitourinary: denies burning with urination or urinary frequency Musculoskeletal: denies any other joint pains or cramps  Skin: denies any other rashes or skin discolorations  Neurological: denies any other headache, dizziness, weakness  Psychiatric: denies any other depression, anxiety   All other review of systems were negative   VITAL SIGNS:  Temp:  [97.8 F (36.6 C)-98.6 F (37 C)] 98.6 F (37 C) (01/28 2144) Pulse Rate:  [74-77] 74 (01/28 2144) Resp:  [20] 20 (01/28 2144) BP: (131-152)/(77-92) 131/89 (01/28 2144) SpO2:  [94 %-100 %] 97 % (01/28 2144) Weight:  [136.1 kg] 136.1 kg (01/28 1501)     Height: 5' 7"  (170.2 cm) Weight: 136.1 kg BMI (Calculated): 46.98   INTAKE/OUTPUT:  This shift: Total I/O In: 1000 [IV Piggyback:1000] Out: -   Last 2 shifts: @IOLAST2SHIFTS @   PHYSICAL EXAM:  Constitutional:  -- Normal body habitus  -- Awake, alert, and oriented x3  Eyes:  -- Pupils equally round and reactive to light  -- No scleral icterus  Ear, nose, and throat:  -- No jugular venous distension  Pulmonary:  -- No crackles  -- Equal breath sounds bilaterally -- Breathing non-labored at rest Cardiovascular:  -- S1, S2 present  -- No pericardial rubs Gastrointestinal:  -- Abdomen soft, nontender, non-distended, no guarding or rebound tenderness --Soft nonreducible incisional hernia lower abdomen.  It was a little bit difficult to identify but once  the hernia is identified he feels soft and able to be reduced without any restriction and without provoking pain to the patient. -- No abdominal masses appreciated, pulsatile or otherwise  Musculoskeletal and Integumentary:  -- Wounds or skin discoloration: None appreciated -- Extremities: B/L UE and LE FROM, hands and feet warm, no edema  Neurologic:  -- Motor function: intact and symmetric -- Sensation: intact and symmetric   Labs:  CBC Latest Ref Rng & Units 12/01/2019 11/02/2019 09/20/2018  WBC 4.0 - 10.5 K/uL 16.8(H) 8.5 7.3  Hemoglobin 12.0 - 15.0 g/dL 13.7 12.6 12.6  Hematocrit 36.0 - 46.0 % 44.3 39.6 38.2  Platelets 150 - 400 K/uL 355 341 284   CMP Latest Ref Rng & Units 12/01/2019 11/02/2019 09/20/2018  Glucose 70 - 99 mg/dL 121(H) 100(H) 120(H)  BUN 6 - 20 mg/dL 9 7 9   Creatinine 0.44 - 1.00 mg/dL 0.75 0.68 0.70  Sodium 135 - 145 mmol/L 137 139 138  Potassium 3.5 - 5.1 mmol/L 4.2 4.1 4.7  Chloride 98 - 111 mmol/L 100 97 96  CO2 22 - 32 mmol/L 28 26 28   Calcium 8.9 - 10.3 mg/dL 9.3 9.1 8.9  Total Protein 6.5 - 8.1 g/dL 8.6(H) 7.2 6.6  Total Bilirubin 0.3 - 1.2 mg/dL 0.6 0.3 <0.2  Alkaline Phos 38 - 126 U/L 113 116 105  AST 15 - 41 U/L 36 40 52(H)  ALT 0 - 44 U/L 22 16 37(H)     Imaging studies:  EXAM: CT ABDOMEN AND PELVIS WITH CONTRAST  TECHNIQUE: Multidetector CT imaging of the abdomen and pelvis was performed using the standard protocol following bolus administration of intravenous contrast.  CONTRAST:  162m OMNIPAQUE IOHEXOL 300 MG/ML  SOLN  COMPARISON:  Abdominal radiograph dated 12/15/2012.  FINDINGS: Lower chest: No acute abnormality.  Hepatobiliary: No focal liver abnormality is seen. Status post cholecystectomy. No biliary dilatation.  Pancreas: Unremarkable. No pancreatic ductal dilatation or surrounding inflammatory changes.  Spleen: Normal in size without focal abnormality.  Adrenals/Urinary Tract: Adrenal glands are unremarkable.  Other than a 2.1 cm cyst in the left kidney, the kidneys are normal, without renal calculi, focal lesion, or hydronephrosis. Bladder is unremarkable.  Stomach/Bowel: Stomach is within normal limits. Enteric contrast reaches the colon. No pericecal inflammatory changes are noted to suggest acute appendicitis. A ventral abdominal wall hernia in the lower abdomen contains a loop of small bowel. There is fluid within the hernia sac and associated fat stranding which is suggestive of strangulation. There is no evidence of bowel obstruction. There is also wall thickening and fat stranding involving a segment of the ileum proximal and distal to the bowel within the hernia sac.  Vascular/Lymphatic: Aortic atherosclerosis. No enlarged abdominal or pelvic lymph nodes.  Reproductive: Uterus and bilateral adnexa are unremarkable.  Other: None.  Musculoskeletal: No acute or significant osseous findings.  IMPRESSION: Ventral abdominal wall hernia in the lower abdomen containing a portion of the ileum. Fat stranding and fluid within the hernia sac is concerning for strangulation, however given that there is also wall thickening and fat stranding involving the ileum proximal and distal to the segment within the hernia sac, this may reflect an infectious or inflammatory ileitis. No evidence of bowel obstruction.  These results were called by telephone at the time of interpretation on 12/01/2019 at 8:13 pm to provider Dr. Joni Fears, who verbally acknowledged these results.   Electronically Signed   By: Zerita Boers M.D.   On: 12/01/2019 20:14  Assessment/Plan:  49 y.o. female with incisional hernia, complicated by pertinent comorbidities including morbid obesity with BMI of 47, suspected IBS, anxiety, hyperlipidemia.  Even though the images are concerning for small bowel thickening and fluid in the hernia sac, the physical exam is completely benign.  There needs reducible.  And I  think that this is the cause of the patient's nausea and diarrhea.  I discussed with the patient the diagnosis of incisional hernia.  She did not know that she had an incisional hernia.  I thoroughly discussed with the patient that my recommendation is to seek evaluation by a weight loss clinic to be able to lose weight to be able to have the hernia repaired.  The risk of hernia at this moment are significantly high due to her morbid obesity.  Phone number and information of bariatric services will be provided to the patient by ED physician.  From surgery standpoint, there is no indication for surgical management at this moment.  Arnold Long, MD

## 2019-12-01 NOTE — ED Provider Notes (Signed)
CT: IMPRESSION:  Ventral abdominal wall hernia in the lower abdomen containing a portion of the ileum. Fat stranding and fluid within the hernia sac is concerning for strangulation, however given that there is also wall thickening and fat stranding involving the ileum proximal and distal to the segment within the hernia sac, this may reflect an infectious or inflammatory ileitis. No evidence of bowel obstruction.    Re-evaluated patient who states she is pain free. On exam, she is obese, but has no tenderness throughout to deep palpation. Abdomen soft, non-distended. Suspect findings on CT are more likely related to infection, but will discuss with surgery.  Discussed w/ surgery who has seen and evaluated and agree with reassuring abdominal exam - hernia noted to be reducible on their exam. No indication for surgical intervention at this time. Suspect CT findings are more so related to infectious or inflammatory ileitis with incidentally associated hernia, but no strangulation.   As such, will plan for discharge w/ supportive care. Follow up with bariatric surgery to assist with weight loss, which would maximize her chance at succesful hernia treatment, per surgery recommendadtions. Advised supportive care, and given follow up information, as well as return precautions. Patient agreeable w/ plan.   Lilia Pro., MD 12/02/19 [phone]

## 2019-12-01 NOTE — ED Notes (Addendum)
Pt to ED c/o N/V, diarrhea and diaphoresis, lower abdominal pain that started at 1300 today. LLQ tender to palpitation.  Denies fever, SHOB, cough.  States she is feeling better now but still uncomfortable.  EDP at bedside

## 2019-12-01 NOTE — ED Triage Notes (Signed)
Patient to ER for c/o N/V/D that began suddenly at approx 1300. Patient became diaphoretic as well. +Abd pain (lower).

## 2019-12-01 NOTE — Discharge Instructions (Addendum)
Thank you for letting us take care of you in the emergency department today.  Your CT scan showed that you have a hernia, and also some inflammation of your intestines.  For the inflammation, continue to stay well-hydrated.  Start with a bland diet and then slowly advance. We have sent you a prescription for an antinausea medication to help, take as directed.   With regards to your hernia, call the [place] [facility] line at [phone] to establish care talk to a doctor. Improving your weight will help with potential hernia repair as well. We have also given below information for the general surgery doctors.   Follow-up with your general doctor in about 1 week to review your ER visit and follow-up on your symptoms.  Please return to the emergency department for any new or worsening symptoms.

## 2019-12-01 NOTE — ED Provider Notes (Signed)
[facility] Emergency Department Provider Note  Time seen: 5:03 PM  I have reviewed the triage vital signs and the nursing notes.   HISTORY  Chief Complaint Emesis and Diarrhea   HPI Lynn French is a 49 y.o. female with a past medical history of anxiety, hyperlipidemia, IBS, presents to the emergency department for nausea vomiting diarrhea and left lower quadrant abdominal pain.  According to the patient at 1:00 today she developed moderate dull left lower quadrant abdominal pain along with diarrhea nausea and vomiting.  Patient states she was diaphoretic at times as well.  Denies any known fever denies any shortness of breath or cough.  Patient states it felt somewhat similar to her IBS although states she has never been formally diagnosed with that.  She states she is feeling better with only slight discomfort currently.   Past Medical History:  Diagnosis Date  . Acid reflux   . Anemia   . Anxiety   . Atypical squamous cells of undetermined significance (ASCUS) on Papanicolaou smear of cervix April 2016   HPV Negative, next pap due April 2017  . B12 deficiency 2000s   history of B12 deficiency  . Diabetes mellitus without complication ([place])   . Hyperlipidemia   . Hypothyroidism   . Obesity   . Tachycardia    with palpitations, negative work up.  . Tobacco use     Patient Active Problem List   Diagnosis Date Noted  . Primary osteoarthritis of right knee 05/11/2018  . History of non anemic vitamin B12 deficiency 10/02/2017  . IFG (impaired fasting glucose) 04/07/2017  . Benign hypertension 04/07/2017  . Edema 06/23/2016  . Depression 05/19/2016  . Recurrent boils 05/19/2016  . GERD (gastroesophageal reflux disease) 05/19/2016  . Hypothyroid   . Anxiety   . Tobacco use   . Hyperlipidemia   . Morbid obesity ([place])     Past Surgical History:  Procedure Laterality Date  . APPENDECTOMY    . BLADDER SURGERY     bladder stem enlarged  .  CESAREAN SECTION  11/17/11  . CHOLECYSTECTOMY    . TUBAL LIGATION  11/17/11  . TYMPANOSTOMY TUBE PLACEMENT    . WISDOM TOOTH EXTRACTION      Prior to Admission medications   Medication Sig Start Date End Date Taking? Authorizing Provider  acetaminophen (TYLENOL) 500 MG tablet Take 1,000 mg by mouth every 8 (eight) hours as needed.    [provider]  Cyanocobalamin (VITAMIN B-12) 5000 MCG SUBL Place 5,000 mcg under the tongue daily.    [provider]  diclofenac sodium (VOLTAREN) 1 % GEL Apply 2 g topically 4 (four) times daily. 01/07/19   Volney American, PA-C  escitalopram (LEXAPRO) 20 MG tablet Take 1 tablet (20 mg total) by mouth daily. 06/23/19   Marnee Guarneri T, NP  hydrochlorothiazide (HYDRODIURIL) 25 MG tablet Take 2 tablets by mouth once daily 06/21/19   Volney American, PA-C  levocetirizine (XYZAL) 5 MG tablet Take 1 tablet (5 mg total) by mouth every evening. 08/03/19   Volney American, PA-C  levothyroxine (SYNTHROID) 112 MCG tablet Take 1 tablet by mouth once daily 11/25/19   Volney American, PA-C  Loratadine 10 MG CAPS Take 10 mg by mouth daily.     [provider]  mupirocin nasal ointment (BACTROBAN) 2 % Place 1 application into the nose 2 (two) times daily. As needed    [provider]  omeprazole (PRILOSEC) 40 MG capsule Take  1 capsule by mouth once daily 11/25/19   Volney American, PA-C  sucralfate (CARAFATE) 1 g tablet Take 1 tablet (1 g total) by mouth 4 (four) times daily -  with meals and at bedtime. 02/07/19   Volney American, PA-C    No Known Allergies  Family History  Problem Relation Age of Onset  . Mental illness Mother   . Cancer Maternal Grandmother        breast  . Allergies Son   . Eczema Son     Social History Social History   Tobacco Use  . Smoking status: Current Every Day Smoker    Packs/day: 0.50    Years: 20.00    Pack years: 10.00    Types: Cigarettes  . Smokeless  tobacco: Never Used  Substance Use Topics  . Alcohol use: No    Alcohol/week: 0.0 standard drinks    Comment: very rare  . Drug use: No    Review of Systems Constitutional: Negative for fever. Cardiovascular: Negative for chest pain. Respiratory: Negative for shortness of breath. Gastrointestinal: Left lower quadrant abdominal pain.  Positive for nausea vomiting and diarrhea. Genitourinary: Negative for urinary compaints Musculoskeletal: Negative for musculoskeletal complaints Neurological: Negative for headache All other ROS negative  ____________________________________________   PHYSICAL EXAM:  VITAL SIGNS: ED Triage Vitals  Enc Vitals Group     BP 12/01/19 1459 (!) 148/77     Pulse Rate 12/01/19 1459 77     Resp 12/01/19 1459 20     Temp 12/01/19 1459 97.8 F (36.6 C)     Temp Source 12/01/19 1459 Oral     SpO2 12/01/19 1459 100 %     Weight 12/01/19 1501 300 lb (136.1 kg)     Height 12/01/19 1501 5' 7"  (1.702 m)     Head Circumference --      Peak Flow --      Pain Score 12/01/19 1500 2     Pain Loc --      Pain Edu? --      Excl. in [place]? --    Constitutional: Alert and oriented. Well appearing and in no distress. Eyes: Normal exam ENT      Head: Normocephalic and atraumatic.      Mouth/Throat: Mucous membranes are moist. Cardiovascular: Normal rate, regular rhythm.  Respiratory: Normal respiratory effort without tachypnea nor retractions. Breath sounds are clear  Gastrointestinal: Soft, mild to moderate left lower quadrant tenderness palpation no rebound or guarding, abdomen otherwise benign.  No distention. Musculoskeletal: Nontender with normal range of motion in all extremities.  Neurologic:  Normal speech and language. No gross focal neurologic deficits  Skin:  Skin is warm, dry and intact.  Psychiatric: Mood and affect are normal.   ____________________________________________    EKG  EKG viewed and interpreted by myself shows a normal sinus  rhythm at 71 bpm with a narrow QRS, normal axis, normal intervals, no concerning ST changes  ____________________________________________   INITIAL IMPRESSION / ASSESSMENT AND PLAN / ED COURSE  Pertinent labs & imaging results that were available during my care of the patient were reviewed by me and considered in my medical decision making (see chart for details).   Patient presents emergency department for left lower quadrant abdominal pain nausea vomiting diarrhea.  Patient has mild tenderness to palpation left lower quadrant.  White blood cell count is elevated at 16,000 which could be due to infectious pathology versus stress response.  We will IV hydrate, treat the patient's  nausea and obtain CT imaging abdomen/pelvis to rule out intra-abdominal pathology such as diverticulitis or colitis.  Patient agreeable to plan of care.  Urinalysis pending.  Patient states she is feeling better, continue to await CT imaging.  Patient care signed out to Dr. Joan Mayans.  Lynn French was evaluated in Emergency Department on 12/01/2019 for the symptoms described in the history of present illness. She was evaluated in the context of the global COVID-19 pandemic, which necessitated consideration that the patient might be at risk for infection with the SARS-CoV-2 virus that causes COVID-19. Institutional protocols and algorithms that pertain to the evaluation of patients at risk for COVID-19 are in a state of rapid change based on information released by regulatory bodies including the CDC and federal and state organizations. These policies and algorithms were followed during the patient's care in the ED.  ____________________________________________   FINAL CLINICAL IMPRESSION(S) / ED DIAGNOSES  Left lower quadrant domino pain Nausea vomiting diarrhea   Harvest Dark, MD 12/01/19 1926

## 2019-12-11 ENCOUNTER — Other Ambulatory Visit: Payer: Self-pay | Admitting: Family Medicine

## 2019-12-11 NOTE — Telephone Encounter (Signed)
Requested medication (s) are due for refill today: yes  Requested medication (s) are on the active medication list: yes  Last refill:  11/25/19  Future visit scheduled: no  Notes to clinic:  pt overdue for TSH recheck. Was due end of January    Requested Prescriptions  Pending Prescriptions Disp Refills   EUTHYROX 112 MCG tablet [Pharmacy Med Name: Euthyrox 112 MCG Oral Tablet] 60 tablet 0    Sig: Take 1 tablet by mouth once daily      Endocrinology:  Hypothyroid Agents Failed - 12/11/2019 10:54 AM      Failed - TSH needs to be rechecked within 3 months after an abnormal result. Refill until TSH is due.      Failed - TSH in normal range and within 360 days    TSH  Date Value Ref Range Status  11/02/2019 15.400 (H) 0.450 - 4.500 uIU/mL Final          Passed - Valid encounter within last 12 months    Recent Outpatient Visits           1 month ago Hypothyroidism, unspecified type   [place], [place]   4 months ago Fatigue, unspecified type   [facility], [place], [place]   5 months ago Current 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 episode [facility])   [place] Marnee Guarneri T, NP   7 months ago Exposure to [place], [place], [place]   10 months ago Hypothyroidism, unspecified type   [place], [place], [place]

## 2020-02-07 ENCOUNTER — Telehealth (INDEPENDENT_AMBULATORY_CARE_PROVIDER_SITE_OTHER): Payer: 59 | Admitting: Family Medicine

## 2020-02-07 ENCOUNTER — Encounter: Payer: Self-pay | Admitting: Family Medicine

## 2020-02-07 VITALS — Temp 97.3°F | Wt 296.0 lb

## 2020-02-07 DIAGNOSIS — K439 Ventral hernia without obstruction or gangrene: Secondary | ICD-10-CM

## 2020-02-07 DIAGNOSIS — E038 Other specified hypothyroidism: Secondary | ICD-10-CM | POA: Diagnosis not present

## 2020-02-07 DIAGNOSIS — B009 Herpesviral infection, unspecified: Secondary | ICD-10-CM

## 2020-02-07 DIAGNOSIS — F320 Major depressive disorder, single episode, mild: Secondary | ICD-10-CM | POA: Diagnosis not present

## 2020-02-07 DIAGNOSIS — R600 Localized edema: Secondary | ICD-10-CM

## 2020-02-07 MED ORDER — VALACYCLOVIR HCL 1 G PO TABS: 1000.0000 mg | ORAL_TABLET | Freq: Every day | ORAL | 3 refills | Status: DC

## 2020-02-07 MED ORDER — VENLAFAXINE HCL ER 75 MG PO CP24: 75.0000 mg | ORAL_CAPSULE | Freq: Every day | ORAL | 0 refills | Status: DC

## 2020-02-07 NOTE — Progress Notes (Signed)
Temp (!) 97.3 F (36.3 C) (Oral)   Wt 296 lb (134.3 kg)   BMI 46.36 kg/m    Subjective:    Patient ID: Lynn French, female    DOB: 06/02/1971, 49 y.o.   MRN: [id number]  HPI: Lynn French is a 49 y.o. female  Chief Complaint  Patient presents with  . Depression    pt states that medication is not working well. pt would like to discuss about a recent hospitalization  . Hypothyroidism   Overdue for TSH recheck, last TSH 10/2019 was elevated to 15 so synthroid was increased. Still feeling fatigued but unsure if this is related to her depression or thyroid.   Hx of HSV, having more frequent outbreaks of cold sores about every other month. Has never been on medication for this. Using OTC topicals with minimal relief.   Depression - feels her moods are not under good control. No motivation, feels fatigued and drowsy. Tried wellbutrin in the past but this caused swelling in legs. Denies SI/HI. Has been on lexapro for years.   Only taking her fluid pill on days where she's working long hour days, such as an 8 hour plus day.   ER visit 11/2019 for N/V/D, ventral hernia per CT scan that day but was consulted by surgery in ER and it was decided she was stable for weight loss prior to outpatient surgery. Has only needed to take the zofran once since and otherwise feeling better from this.   Depression screen [facility] 2/9 02/07/2020 11/02/2019 08/03/2019  Decreased Interest 3 2 3   Down, Depressed, Hopeless 1 1 0  PHQ - 2 Score 4 3 3   Altered sleeping 1 3 1   Tired, decreased energy 2 2 2   Change in appetite 2 1 2   Feeling bad or failure about yourself  0 1 0  Trouble concentrating 1 1 1   Moving slowly or fidgety/restless 0 0 0  Suicidal thoughts 0 0 0  PHQ-9 Score 10 11 9   Difficult doing work/chores Somewhat difficult - -  Some recent data might be hidden   GAD 7 : Generalized Anxiety Score 02/07/2020 11/02/2019 06/23/2019 02/07/2019  Nervous, Anxious, on Edge 1 1 0 3  Control/stop  worrying 0 0 0 2  Worry too much - different things 1 1 1 2   Trouble relaxing 1 0 0 2  Restless 2 2 1 2   Easily annoyed or irritable 1 2 1 3   Afraid - awful might happen 0 1 0 1  Total GAD 7 Score 6 7 3 15   Anxiety Difficulty Somewhat difficult Somewhat difficult Somewhat difficult Somewhat difficult     Relevant past medical, surgical, family and social history reviewed and updated as indicated. Interim medical history since our last visit reviewed. Allergies and medications reviewed and updated.  Review of Systems  Per HPI unless specifically indicated above     Objective:    Temp (!) 97.3 F (36.3 C) (Oral)   Wt 296 lb (134.3 kg)   BMI 46.36 kg/m   Wt Readings from Last 3 Encounters:  02/07/20 296 lb (134.3 kg)  12/01/19 300 lb (136.1 kg)  11/02/19 296 lb (134.3 kg)    Physical Exam Vitals and nursing note reviewed.  Constitutional:      Appearance: Normal appearance. She is not ill-appearing.  HENT:     Head: Atraumatic.  Eyes:     Extraocular Movements: Extraocular movements intact.     Conjunctiva/sclera: Conjunctivae normal.  Cardiovascular:  Rate and Rhythm: Normal rate and regular rhythm.     Heart sounds: Normal heart sounds.  Pulmonary:     Effort: Pulmonary effort is normal.     Breath sounds: Normal breath sounds.  Musculoskeletal:        General: Normal range of motion.     Cervical back: Normal range of motion and neck supple.  Skin:    General: Skin is warm and dry.  Neurological:     Mental Status: She is alert and oriented to person, place, and time.  Psychiatric:        Mood and Affect: Mood normal.        Thought Content: Thought content normal.        Judgment: Judgment normal.     Results for orders placed or performed during the hospital encounter of 12/01/19  Lipase, blood  Result Value Ref Range   Lipase 19 11 - 51 U/L  Comprehensive metabolic panel  Result Value Ref Range   Sodium 137 135 - 145 mmol/L   Potassium 4.2 3.5  - 5.1 mmol/L   Chloride 100 98 - 111 mmol/L   CO2 28 22 - 32 mmol/L   Glucose, Bld 121 (H) 70 - 99 mg/dL   BUN 9 6 - 20 mg/dL   Creatinine, Ser 0.75 0.44 - 1.00 mg/dL   Calcium 9.3 8.9 - 10.3 mg/dL   Total Protein 8.6 (H) 6.5 - 8.1 g/dL   Albumin 3.9 3.5 - 5.0 g/dL   AST 36 15 - 41 U/L   ALT 22 0 - 44 U/L   Alkaline Phosphatase 113 38 - 126 U/L   Total Bilirubin 0.6 0.3 - 1.2 mg/dL   GFR calc non Af Amer >60 >60 mL/min   GFR calc Af Amer >60 >60 mL/min   Anion gap 9 5 - 15  CBC  Result Value Ref Range   WBC 16.8 (H) 4.0 - 10.5 K/uL   RBC 5.42 (H) 3.87 - 5.11 MIL/uL   Hemoglobin 13.7 12.0 - 15.0 g/dL   HCT 44.3 36.0 - 46.0 %   MCV 81.7 80.0 - 100.0 fL   MCH 25.3 (L) 26.0 - 34.0 pg   MCHC 30.9 30.0 - 36.0 g/dL   RDW 17.6 (H) 11.5 - 15.5 %   Platelets 355 150 - 400 K/uL   nRBC 0.0 0.0 - 0.2 %  Urinalysis, Complete w Microscopic  Result Value Ref Range   Color, Urine YELLOW (A) YELLOW   APPearance CLEAR (A) CLEAR   Specific Gravity, Urine 1.011 1.005 - 1.030   pH 6.0 5.0 - 8.0   Glucose, UA NEGATIVE NEGATIVE mg/dL   Hgb urine dipstick NEGATIVE NEGATIVE   Bilirubin Urine NEGATIVE NEGATIVE   Ketones, ur NEGATIVE NEGATIVE mg/dL   Protein, ur NEGATIVE NEGATIVE mg/dL   Nitrite NEGATIVE NEGATIVE   Leukocytes,Ua NEGATIVE NEGATIVE   RBC / HPF 0-5 0 - 5 RBC/hpf   WBC, UA 0-5 0 - 5 WBC/hpf   Bacteria, UA NONE SEEN NONE SEEN   Squamous Epithelial / LPF 0-5 0 - 5   Mucus PRESENT   Pregnancy, urine POC  Result Value Ref Range   Preg Test, Ur NEGATIVE NEGATIVE      Assessment & Plan:   Problem List Items Addressed This Visit      Endocrine   Hypothyroid - Primary    Recheck TSH, adjust as needed        Other   Morbid obesity ([place])  Continue diet and exercise changes to aid in weight loss      Depression    Will try switching to effexor, monitor closely for benefit. D/c lexapro      Relevant Medications   venlafaxine XR (EFFEXOR XR) 75 MG 24 hr capsule   HSV  (herpes simplex virus) infection    Will start valtrex daily preventative dosing per patient preference given frequency of outbreaks      Relevant Medications   valACYclovir (VALTREX) 1000 MG tablet    Other Visit Diagnoses    Ventral hernia without obstruction or gangrene       Weight loss recommended prior to surgery, continue efforts toward this but ER precautions given if worsening   Leg edema       Continue working on weight loss, compression stockings, exercise. Continue diuretic prn as long as BPs staying consistently at goal       Follow up plan: Return in about 4 weeks (around 03/06/2020) for mood f/u.

## 2020-02-13 ENCOUNTER — Other Ambulatory Visit: Payer: 59

## 2020-02-13 ENCOUNTER — Other Ambulatory Visit: Payer: Self-pay

## 2020-02-13 DIAGNOSIS — E039 Hypothyroidism, unspecified: Secondary | ICD-10-CM

## 2020-02-14 LAB — TSH: TSH: 9.31 u[IU]/mL — ABNORMAL HIGH (ref 0.450–4.500)

## 2020-02-15 ENCOUNTER — Other Ambulatory Visit: Payer: Self-pay | Admitting: Family Medicine

## 2020-02-15 MED ORDER — LEVOTHYROXINE SODIUM 125 MCG PO TABS: 125.0000 ug | ORAL_TABLET | Freq: Every day | ORAL | 0 refills | Status: DC

## 2020-02-16 DIAGNOSIS — B009 Herpesviral infection, unspecified: Secondary | ICD-10-CM | POA: Insufficient documentation

## 2020-02-16 NOTE — Assessment & Plan Note (Signed)
Continue diet and exercise changes to aid in weight loss

## 2020-02-16 NOTE — Assessment & Plan Note (Signed)
Recheck TSH, adjust as needed

## 2020-02-16 NOTE — Assessment & Plan Note (Signed)
Will try switching to effexor, monitor closely for benefit. D/c lexapro

## 2020-02-16 NOTE — Assessment & Plan Note (Addendum)
Will start valtrex daily preventative dosing per patient preference given frequency of outbreaks

## 2020-02-19 ENCOUNTER — Other Ambulatory Visit: Payer: Self-pay | Admitting: Family Medicine

## 2020-02-19 NOTE — Telephone Encounter (Signed)
Requested Prescriptions  Pending Prescriptions Disp Refills  . omeprazole (PRILOSEC) 40 MG capsule [Pharmacy Med Name: Omeprazole 40 MG Oral Capsule Delayed Release] 90 capsule 0    Sig: Take 1 capsule by mouth once daily     Gastroenterology: Proton Pump Inhibitors Passed - 02/19/2020  9:46 AM      Passed - Valid encounter within last 12 months    Recent Outpatient Visits          1 week ago Other specified hypothyroidism   [facility] Volney American, [place]   3 months ago Hypothyroidism, unspecified type   [facility], [place], [place]   6 months ago Fatigue, unspecified type   [facility], [place], [place]   8 months ago Current 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 episode [facility])   [place] Marnee Guarneri T, NP   9 months ago Exposure to [place], Lilia Argue, [place]      Future Appointments            In 2 weeks O[facility] Desanctis, [place] Argue, [place], [place]

## 2020-02-22 ENCOUNTER — Encounter: Payer: Self-pay | Admitting: Family Medicine

## 2020-03-05 ENCOUNTER — Encounter: Payer: Self-pay | Admitting: Family Medicine

## 2020-03-06 ENCOUNTER — Other Ambulatory Visit: Payer: Self-pay | Admitting: Family Medicine

## 2020-03-06 MED ORDER — VENLAFAXINE HCL ER 75 MG PO CP24: 75.0000 mg | ORAL_CAPSULE | Freq: Every day | ORAL | 0 refills | Status: DC

## 2020-03-06 NOTE — Telephone Encounter (Signed)
Pt called in returning office call. Pt has rescheduled apt but states that she will be out of her medication by her apt. Pt would like to know if provider would send in a refill of her depression medication ?   Pharmacy:  [facility]), [place] - [address] Phone:  [phone]  Fax:  [phone]

## 2020-03-06 NOTE — Telephone Encounter (Signed)
Requested Prescriptions  Pending Prescriptions Disp Refills  . venlafaxine XR (EFFEXOR XR) 75 MG 24 hr capsule 90 capsule 0    Sig: Take 1 capsule (75 mg total) by mouth daily with breakfast.     Psychiatry: Antidepressants - SNRI - desvenlafaxine & venlafaxine Failed - 03/06/2020 12:52 PM      Failed - LDL in normal range and within 360 days    LDL Chol Calc (NIH)  Date Value Ref Range Status  11/02/2019 134 (H) 0 - 99 mg/dL Final         Failed - Total Cholesterol in normal range and within 360 days    Cholesterol, Total  Date Value Ref Range Status  11/02/2019 203 (H) 100 - 199 mg/dL Final         Failed - Triglycerides in normal range and within 360 days    Triglycerides  Date Value Ref Range Status  11/02/2019 182 (H) 0 - 149 mg/dL Final         Passed - Completed PHQ-2 or PHQ-9 in the last 360 days.      Passed - Last BP in normal range    BP Readings from Last 1 Encounters:  12/01/19 131/89         Passed - Valid encounter within last 6 months    Recent Outpatient Visits          4 weeks ago Other specified hypothyroidism   [facility] Merrie Roof [place], [place]   4 months ago Hypothyroidism, unspecified type   [place], [place], [place]   7 months ago Fatigue, unspecified type   [facility], [place], [place]   8 months ago Current 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 episode [facility])   [place], Jolene T, NP   10 months ago Exposure to [place], [place]ilia Argue, [place]      Future Appointments            In 1 week Orene Desanctis, Lilia Argue, [place], [place]

## 2020-03-09 ENCOUNTER — Telehealth: Payer: 59 | Admitting: Family Medicine

## 2020-03-14 ENCOUNTER — Telehealth (INDEPENDENT_AMBULATORY_CARE_PROVIDER_SITE_OTHER): Payer: 59 | Admitting: Family Medicine

## 2020-03-14 ENCOUNTER — Encounter: Payer: Self-pay | Admitting: Family Medicine

## 2020-03-14 DIAGNOSIS — F320 Major depressive disorder, single episode, mild: Secondary | ICD-10-CM

## 2020-03-14 DIAGNOSIS — E039 Hypothyroidism, unspecified: Secondary | ICD-10-CM | POA: Diagnosis not present

## 2020-03-14 MED ORDER — VENLAFAXINE HCL ER 150 MG PO CP24: 150.0000 mg | ORAL_CAPSULE | Freq: Every day | ORAL | 0 refills | Status: DC

## 2020-03-14 NOTE — Progress Notes (Signed)
There were no vitals taken for this visit.   Subjective:    Patient ID: Lynn French, female    DOB: 02/21/1971, 49 y.o.   MRN: [id number]  HPI: Lynn French is a 49 y.o. female  Chief Complaint  Patient presents with  . Depression    . This visit was completed via MyChart due to the restrictions of the COVID-19 pandemic. All issues as above were discussed and addressed. Physical exam was done as above through visual confirmation on MyChart. If it was felt that the patient should be evaluated in the office, they were directed there. The patient verbally consented to this visit. . Location of the patient: home . Location of the provider: work . Those involved with this call:  . Provider: Merrie Roof, PA-C . CMA: Merilyn Baba, [place] . Front Desk/Registration: Jill Side  . Time spent on call: 25 minutes with patient face to face via video conference. More than 50% of this time was spent in counseling and coordination of care. 5 minutes total spent in review of patient's record and preparation of their chart. I verified patient identity using two factors (patient name and date of birth). Patient consents verbally to being seen via telemedicine visit today.   Depression - So far feeling better than on the lexapro now that she's on the effexor. Has more motivation and desire to do things than previously but still not where she would like to be. Denies SI/HI. Synthroid was increased due to undertreated TSH, tolerating that dose change well.   Depression screen [facility] 2/9 03/14/2020 02/07/2020 11/02/2019  Decreased Interest 2 3 2   Down, Depressed, Hopeless 0 1 1  PHQ - 2 Score 2 4 3   Altered sleeping 1 1 3   Tired, decreased energy 1 2 2   Change in appetite 3 2 1   Feeling bad or failure about yourself  1 0 1  Trouble concentrating 2 1 1   Moving slowly or fidgety/restless 0 0 0  Suicidal thoughts 0 0 0  PHQ-9 Score 10 10 11   Difficult doing work/chores Somewhat difficult Somewhat  difficult -  Some recent data might be hidden   Relevant past medical, surgical, family and social history reviewed and updated as indicated. Interim medical history since our last visit reviewed. Allergies and medications reviewed and updated.  Review of Systems  Per HPI unless specifically indicated above     Objective:    There were no vitals taken for this visit.  Wt Readings from Last 3 Encounters:  02/07/20 296 lb (134.3 kg)  12/01/19 300 lb (136.1 kg)  11/02/19 296 lb (134.3 kg)    Physical Exam Vitals and nursing note reviewed.  Constitutional:      General: She is not in acute distress.    Appearance: Normal appearance.  HENT:     Head: Atraumatic.     Right Ear: External ear normal.     Left Ear: External ear normal.     Nose: Nose normal. No congestion.     Mouth/Throat:     Mouth: Mucous membranes are moist.     Pharynx: Oropharynx is clear. No posterior oropharyngeal erythema.  Eyes:     Extraocular Movements: Extraocular movements intact.     Conjunctiva/sclera: Conjunctivae normal.  Cardiovascular:     Comments: Unable to assess via virtual visit Pulmonary:     Effort: Pulmonary effort is normal. No respiratory distress.  Musculoskeletal:        General: Normal range of motion.  Cervical back: Normal range of motion.  Skin:    General: Skin is dry.     Findings: No erythema.  Neurological:     Mental Status: She is alert and oriented to person, place, and time.  Psychiatric:        Mood and Affect: Mood normal.        Thought Content: Thought content normal.        Judgment: Judgment normal.     Results for orders placed or performed in visit on 02/13/20  TSH  Result Value Ref Range   TSH 9.310 (H) 0.450 - 4.500 uIU/mL      Assessment & Plan:   Problem List Items Addressed This Visit      Endocrine   Hypothyroid - Primary    Recheck TSH, adjust as needed. Continue current regimen in meantime      Relevant Orders   TSH     Other    Depression    Notes some early improvement with change to effexor. Increase dose to 150 mg XL and continue to monitor closely for benefit      Relevant Medications   venlafaxine XR (EFFEXOR XR) 150 MG 24 hr capsule       Follow up plan: Return in about 6 weeks (around 04/25/2020) for Mood, Thyroid f/u.

## 2020-03-15 ENCOUNTER — Encounter: Payer: Self-pay | Admitting: Family Medicine

## 2020-03-15 ENCOUNTER — Telehealth: Payer: Self-pay | Admitting: Family Medicine

## 2020-03-15 NOTE — Telephone Encounter (Signed)
lvm sent letter 6 week f.u

## 2020-03-15 NOTE — Assessment & Plan Note (Signed)
Recheck TSH, adjust as needed. Continue current regimen in meantime

## 2020-03-15 NOTE — Assessment & Plan Note (Signed)
Notes some early improvement with change to effexor. Increase dose to 150 mg XL and continue to monitor closely for benefit

## 2020-03-15 NOTE — Telephone Encounter (Signed)
-----   Message from [place], [place] sent at 03/15/2020  5:14 AM EDT ----- 6 weeks mood and thyroid f/u

## 2020-03-16 ENCOUNTER — Encounter: Payer: Self-pay | Admitting: Family Medicine

## 2020-03-16 ENCOUNTER — Ambulatory Visit (INDEPENDENT_AMBULATORY_CARE_PROVIDER_SITE_OTHER): Payer: 59 | Admitting: Family Medicine

## 2020-03-16 ENCOUNTER — Other Ambulatory Visit: Payer: Self-pay

## 2020-03-16 VITALS — BP 176/110 | HR 84 | Temp 98.0°F | Ht 67.32 in | Wt 294.0 lb

## 2020-03-16 DIAGNOSIS — T2220XA Burn of second degree of shoulder and upper limb, except wrist and hand, unspecified site, initial encounter: Secondary | ICD-10-CM | POA: Diagnosis not present

## 2020-03-16 DIAGNOSIS — M79601 Pain in right arm: Secondary | ICD-10-CM | POA: Diagnosis not present

## 2020-03-16 DIAGNOSIS — E039 Hypothyroidism, unspecified: Secondary | ICD-10-CM

## 2020-03-16 MED ORDER — SILVER SULFADIAZINE 1 % EX CREA: 1.0000 "application " | TOPICAL_CREAM | Freq: Two times a day (BID) | CUTANEOUS | 0 refills | Status: DC

## 2020-03-16 MED ORDER — KETOROLAC TROMETHAMINE 60 MG/2ML IM SOLN
60.0000 mg | Freq: Once | INTRAMUSCULAR | Status: AC
  Administered 2020-03-16: 60 mg via INTRAMUSCULAR

## 2020-03-16 MED ORDER — TRAMADOL HCL 50 MG PO TABS: 50.0000 mg | ORAL_TABLET | Freq: Three times a day (TID) | ORAL | 0 refills | Status: AC | PRN

## 2020-03-16 NOTE — Progress Notes (Signed)
   BP (!) 176/110   Pulse 84   Temp 98 F (36.7 C)   Ht 5' 7.32" (1.71 m)   Wt 294 lb (133.4 kg)   SpO2 100%   BMI 45.61 kg/m    Subjective:    Patient ID: Lynn French, female    DOB: 1971/10/27, 49 y.o.   MRN: [id number]  HPI: Aaylah SHAKIRAH KIRKEY is a 49 y.o. female  Chief Complaint  Patient presents with  . Burn    right arm   Spilled piping hot coffee on her right arm this morning on accident and has blisters and redness forming on area. Significant burning pain ongoing since accident. Has not applied anything to area since onset. Denies fever, chills, injuries elsewhere.   Relevant past medical, surgical, family and social history reviewed and updated as indicated. Interim medical history since our last visit reviewed. Allergies and medications reviewed and updated.  Review of Systems  Per HPI unless specifically indicated above     Objective:    BP (!) 176/110   Pulse 84   Temp 98 F (36.7 C)   Ht 5' 7.32" (1.71 m)   Wt 294 lb (133.4 kg)   SpO2 100%   BMI 45.61 kg/m   Wt Readings from Last 3 Encounters:  03/16/20 294 lb (133.4 kg)  02/07/20 296 lb (134.3 kg)  12/01/19 300 lb (136.1 kg)    Physical Exam Vitals and nursing note reviewed.  Constitutional:      Appearance: Normal appearance. She is not ill-appearing.  HENT:     Head: Atraumatic.  Eyes:     Extraocular Movements: Extraocular movements intact.     Conjunctiva/sclera: Conjunctivae normal.  Cardiovascular:     Rate and Rhythm: Normal rate and regular rhythm.     Heart sounds: Normal heart sounds.  Pulmonary:     Effort: Pulmonary effort is normal.     Breath sounds: Normal breath sounds.  Musculoskeletal:        General: Normal range of motion.     Cervical back: Normal range of motion and neck supple.  Skin:    General: Skin is warm.     Comments: Large area of erythema with multiple large blisters present on right arm  Neurological:     Mental Status: She is alert and oriented to  person, place, and time.  Psychiatric:        Mood and Affect: Mood normal.        Thought Content: Thought content normal.        Judgment: Judgment normal.     Results for orders placed or performed in visit on 02/13/20  TSH  Result Value Ref Range   TSH 9.310 (H) 0.450 - 4.500 uIU/mL      Assessment & Plan:   Problem List Items Addressed This Visit      Endocrine   Hypothyroid    Other Visit Diagnoses    Right arm pain    -  Primary   2nd degree burn from coffee spill. IM toradol and prn tramadol given for pain relief, ice, lavender, aloe recommended. Tx w/ silvedene cream BID until resolved   Relevant Medications   ketorolac (TORADOL) injection 60 mg (Completed)   Second degree burn of arm, initial encounter           Follow up plan: Return if symptoms worsen or fail to improve.

## 2020-03-16 NOTE — Patient Instructions (Signed)
Lavender essential oils diluted in olive oil or coconut oil, aloe vera, and I will send over some silvedene burn cream for you to use several times per day.

## 2020-03-17 LAB — TSH: TSH: 6.98 u[IU]/mL — ABNORMAL HIGH (ref 0.450–4.500)

## 2020-03-18 ENCOUNTER — Other Ambulatory Visit: Payer: Self-pay | Admitting: Family Medicine

## 2020-03-18 DIAGNOSIS — E039 Hypothyroidism, unspecified: Secondary | ICD-10-CM

## 2020-03-18 MED ORDER — LEVOTHYROXINE SODIUM 150 MCG PO TABS: 150.0000 ug | ORAL_TABLET | Freq: Every day | ORAL | 0 refills | Status: DC

## 2020-03-22 ENCOUNTER — Other Ambulatory Visit: Payer: Self-pay | Admitting: Family Medicine

## 2020-03-25 ENCOUNTER — Other Ambulatory Visit: Payer: Self-pay | Admitting: Family Medicine

## 2020-03-25 NOTE — Telephone Encounter (Signed)
Requested Prescriptions  Pending Prescriptions Disp Refills  . levocetirizine (XYZAL) 5 MG tablet [Pharmacy Med Name: Levocetirizine Dihydrochloride 5 MG Oral Tablet] 90 tablet 0    Sig: TAKE 1 TABLET BY MOUTH ONCE DAILY IN THE EVENING     Ear, Nose, and Throat:  Antihistamines Passed - 03/25/2020 10:33 AM      Passed - Valid encounter within last 12 months    Recent Outpatient Visits          1 week ago Right arm pain   [place], [place]   1 week ago Hypothyroidism, unspecified type   [facility], [place] Arg[place], [place]   1 month ago Other specified hypothyroidism   [place], [place], [place]   4 months ago Hypothyroidism, unspecified type   [facility], [place], [place]   7 months ago Fatigue, unspecified type   [facility], [place] Arg[place], [place]      Future Appointments            In 1 month O[facility] Desanctis, [place] Argue, [place], [place]

## 2020-03-29 ENCOUNTER — Other Ambulatory Visit: Payer: Self-pay | Admitting: Family Medicine

## 2020-04-26 ENCOUNTER — Ambulatory Visit (INDEPENDENT_AMBULATORY_CARE_PROVIDER_SITE_OTHER): Payer: 59 | Admitting: Family Medicine

## 2020-04-26 ENCOUNTER — Other Ambulatory Visit: Payer: Self-pay

## 2020-04-26 ENCOUNTER — Encounter: Payer: Self-pay | Admitting: Family Medicine

## 2020-04-26 VITALS — BP 117/79 | HR 90 | Temp 98.0°F | Wt 291.0 lb

## 2020-04-26 DIAGNOSIS — R5383 Other fatigue: Secondary | ICD-10-CM | POA: Diagnosis not present

## 2020-04-26 DIAGNOSIS — E039 Hypothyroidism, unspecified: Secondary | ICD-10-CM

## 2020-04-26 DIAGNOSIS — F320 Major depressive disorder, single episode, mild: Secondary | ICD-10-CM | POA: Diagnosis not present

## 2020-04-26 DIAGNOSIS — R7301 Impaired fasting glucose: Secondary | ICD-10-CM

## 2020-04-26 DIAGNOSIS — E782 Mixed hyperlipidemia: Secondary | ICD-10-CM

## 2020-04-26 MED ORDER — VENLAFAXINE HCL ER 150 MG PO CP24: 150.0000 mg | ORAL_CAPSULE | Freq: Every day | ORAL | 1 refills | Status: DC

## 2020-04-27 LAB — LIPID PANEL W/O CHOL/HDL RATIO
Cholesterol, Total: 173 mg/dL (ref 100–199)
HDL: 30 mg/dL — ABNORMAL LOW (ref >39)
LDL Chol Calc (NIH): 104 mg/dL — ABNORMAL HIGH (ref 0–99)
Triglycerides: 228 mg/dL — ABNORMAL HIGH (ref 0–149)
VLDL Cholesterol Cal: 39 mg/dL (ref 5–40)

## 2020-04-27 LAB — VITAMIN D 25 HYDROXY (VIT D DEFICIENCY, FRACTURES): Vit D, 25-Hydroxy: 19.8 ng/mL — ABNORMAL LOW (ref 30.0–100.0)

## 2020-04-27 LAB — HEMOGLOBIN A1C
Est. average glucose Bld gHb Est-mCnc: 128 mg/dL
Hgb A1c MFr Bld: 6.1 % — ABNORMAL HIGH (ref 4.8–5.6)

## 2020-04-27 LAB — TSH: TSH: 0.592 u[IU]/mL (ref 0.450–4.500)

## 2020-04-29 NOTE — Assessment & Plan Note (Signed)
Improved with increased effexor dose. Continue current regimen with close monitoring

## 2020-04-29 NOTE — Assessment & Plan Note (Signed)
Due for lipid recheck, adjust as needed based on results

## 2020-04-29 NOTE — Assessment & Plan Note (Signed)
Recheck TSH, adjust if needed. Continue current regimen

## 2020-04-29 NOTE — Assessment & Plan Note (Signed)
Diet controlled, due for recheck. Continue working on diet and exercise changes for good control

## 2020-04-29 NOTE — Progress Notes (Signed)
BP 117/79   Pulse 90   Temp 98 F (36.7 C) (Oral)   Wt 291 lb (132 kg)   SpO2 97%   BMI 45.14 kg/m    Subjective:    Patient ID: Lynn French, female    DOB: [birth year], 49 y.o.   MRN: [id number]  HPI: Lynn French is a 49 y.o. female  Chief Complaint  Patient presents with  . Depression   Here today for 1 month mood f/u after increasing effexor dose. She does feel this is helping but still feeling fatigued, unmotivated and lethargic. Taking her synthroid daily which is due for recheck because dose was increased about 6 weeks ago. Denies fever, chills, unintended weight loss, SI/HI.   Depression screen [facility] 2/9 04/26/2020 03/14/2020 02/07/2020  Decreased Interest 2 2 3   Down, Depressed, Hopeless 0 0 1  PHQ - 2 Score 2 2 4   Altered sleeping 1 1 1   Tired, decreased energy 2 1 2   Change in appetite 2 3 2   Feeling bad or failure about yourself  0 1 0  Trouble concentrating 1 2 1   Moving slowly or fidgety/restless 0 0 0  Suicidal thoughts 0 0 0  PHQ-9 Score 8 10 10   Difficult doing work/chores - Somewhat difficult Somewhat difficult  Some recent data might be hidden   GAD 7 : Generalized Anxiety Score 02/07/2020 11/02/2019 06/23/2019 02/07/2019  Nervous, Anxious, on Edge 1 1 0 3  Control/stop worrying 0 0 0 2  Worry too much - different things 1 1 1 2   Trouble relaxing 1 0 0 2  Restless 2 2 1 2   Easily annoyed or irritable 1 2 1 3   Afraid - awful might happen 0 1 0 1  Total GAD 7 Score 6 7 3 15   Anxiety Difficulty Somewhat difficult Somewhat difficult Somewhat difficult Somewhat difficult     Relevant past medical, surgical, family and social history reviewed and updated as indicated. Interim medical history since our last visit reviewed. Allergies and medications reviewed and updated.  Review of Systems  Per HPI unless specifically indicated above     Objective:    BP 117/79   Pulse 90   Temp 98 F (36.7 C) (Oral)   Wt 291 lb (132 kg)   SpO2 97%   BMI 45.14  kg/m   Wt Readings from Last 3 Encounters:  04/26/20 291 lb (132 kg)  03/16/20 294 lb (133.4 kg)  02/07/20 296 lb (134.3 kg)    Physical Exam Vitals and nursing note reviewed.  Constitutional:      Appearance: Normal appearance. She is not ill-appearing.  HENT:     Head: Atraumatic.  Eyes:     Extraocular Movements: Extraocular movements intact.     Conjunctiva/sclera: Conjunctivae normal.  Cardiovascular:     Rate and Rhythm: Normal rate and regular rhythm.     Heart sounds: Normal heart sounds.  Pulmonary:     Effort: Pulmonary effort is normal.     Breath sounds: Normal breath sounds.  Musculoskeletal:        General: Normal range of motion.     Cervical back: Normal range of motion and neck supple.  Skin:    General: Skin is warm and dry.  Neurological:     Mental Status: She is alert and oriented to person, place, and time.  Psychiatric:        Mood and Affect: Mood normal.        Thought Content: Thought content  normal.        Judgment: Judgment normal.     Results for orders placed or performed in visit on 04/26/20  Vitamin D (25 hydroxy)  Result Value Ref Range   Vit D, 25-Hydroxy 19.8 (L) 30.0 - 100.0 ng/mL  TSH  Result Value Ref Range   TSH 0.592 0.450 - 4.500 uIU/mL  Lipid Panel w/o Chol/HDL Ratio  Result Value Ref Range   Cholesterol, Total 173 100 - 199 mg/dL   Triglycerides 228 (H) 0 - 149 mg/dL   HDL 30 (L) >39 mg/dL   VLDL Cholesterol Cal 39 5 - 40 mg/dL   LDL Chol Calc (NIH) 104 (H) 0 - 99 mg/dL  HgB A1c  Result Value Ref Range   Hgb A1c MFr Bld 6.1 (H) 4.8 - 5.6 %   Est. average glucose Bld gHb Est-mCnc 128 mg/dL      Assessment & Plan:   Problem List Items Addressed This Visit      Endocrine   Hypothyroid    Recheck TSH, adjust if needed. Continue current regimen      IFG (impaired fasting glucose)    Diet controlled, due for recheck. Continue working on diet and exercise changes for good control      Relevant Orders   HgB A1c  (Completed)     Other   Hyperlipidemia    Due for lipid recheck, adjust as needed based on results      Relevant Orders   Lipid Panel w/o Chol/HDL Ratio (Completed)   Depression - Primary    Improved with increased effexor dose. Continue current regimen with close monitoring      Relevant Medications   venlafaxine XR (EFFEXOR XR) 150 MG 24 hr capsule    Other Visit Diagnoses    Fatigue, unspecified type       Increase synthroid, start vit D supplements (vit D last checked in 2018 and slightly low), and increase exercise. Recheck in 6 weeks at CPE       Follow up plan: Return in about 6 months (around 10/26/2020) for CPE.

## 2020-06-14 ENCOUNTER — Encounter: Payer: Self-pay | Admitting: Family Medicine

## 2020-06-17 ENCOUNTER — Other Ambulatory Visit: Payer: Self-pay | Admitting: Family Medicine

## 2020-06-17 NOTE — Telephone Encounter (Signed)
Requested Prescriptions  Pending Prescriptions Disp Refills   levothyroxine (SYNTHROID) 150 MCG tablet [Pharmacy Med Name: Levothyroxine Sodium 150 MCG Oral Tablet] 90 tablet 1    Sig: Take 1 tablet by mouth once daily     Endocrinology:  Hypothyroid Agents Failed - 06/17/2020  8:15 AM      Failed - TSH needs to be rechecked within 3 months after an abnormal result. Refill until TSH is due.      Passed - TSH in normal range and within 360 days    TSH  Date Value Ref Range Status  04/26/2020 0.592 0.450 - 4.500 uIU/mL Final         Passed - Valid encounter within last 12 months    Recent Outpatient Visits          1 month ago Current 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 episode [facility])   [facility] [place], [place]   3 months ago Right arm pain   [place], [place], [place]   3 months ago Hypothyroidism, unspecified type   [facility] [place], [place], [place]   4 months ago Other specified hypothyroidism   [place], [place], [place]   7 months ago Hypothyroidism, unspecified type   [facility], [place], [place]      Future Appointments            In 4 months Cannady, Barbaraann Faster, NP [employer], [facility]

## 2020-06-17 NOTE — Telephone Encounter (Signed)
Requested Prescriptions  Pending Prescriptions Disp Refills  . hydrochlorothiazide (HYDRODIURIL) 25 MG tablet [Pharmacy Med Name: hydroCHLOROthiazide 25 MG Oral Tablet] 180 tablet 1    Sig: Take 2 tablets by mouth once daily     Cardiovascular: Diuretics - Thiazide Passed - 06/17/2020  8:08 AM      Passed - Ca in normal range and within 360 days    Calcium  Date Value Ref Range Status  12/01/2019 9.3 8.9 - 10.3 mg/dL Final   Calcium, Total  Date Value Ref Range Status  12/15/2012 9.2 8.5 - 10.1 mg/dL Final         Passed - Cr in normal range and within 360 days    Creatinine  Date Value Ref Range Status  12/15/2012 0.66 0.60 - 1.30 mg/dL Final   Creatinine, Ser  Date Value Ref Range Status  12/01/2019 0.75 0.44 - 1.00 mg/dL Final         Passed - K in normal range and within 360 days    Potassium  Date Value Ref Range Status  12/01/2019 4.2 3.5 - 5.1 mmol/L Final  12/15/2012 3.9 3.5 - 5.1 mmol/L Final         Passed - Na in normal range and within 360 days    Sodium  Date Value Ref Range Status  12/01/2019 137 135 - 145 mmol/L Final  11/02/2019 139 134 - 144 mmol/L Final  12/15/2012 136 136 - 145 mmol/L Final         Passed - Last BP in normal range    BP Readings from Last 1 Encounters:  04/26/20 117/79         Passed - Valid encounter within last 6 months    Recent Outpatient Visits          1 month ago Current 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 episode [facility])   [facility] [place], PA-C   3 months ago Right arm pain   [place], [place], [place]   3 months ago Hypothyroidism, unspecified type   [place], [place], [place]   4 months ago Other specified hypothyroidism   [place], [place], [place]   7 months ago Hypothyroidism, unspecified type   [facility], [place], [place]      Future Appointments             In 4 months Cannady, Barbaraann Faster, NP [employer], [facility]

## 2020-08-23 ENCOUNTER — Other Ambulatory Visit: Payer: Self-pay | Admitting: Family Medicine

## 2020-08-23 NOTE — Telephone Encounter (Signed)
Appointment 10/31/20- Rx approved

## 2020-09-02 ENCOUNTER — Other Ambulatory Visit: Payer: Self-pay | Admitting: Family Medicine

## 2020-09-02 NOTE — Telephone Encounter (Signed)
Requested Prescriptions  Pending Prescriptions Disp Refills  . levocetirizine (XYZAL) 5 MG tablet [Pharmacy Med Name: Levocetirizine Dihydrochloride 5 MG Oral Tablet] 90 tablet 0    Sig: TAKE 1 TABLET BY MOUTH ONCE DAILY IN THE EVENING     Ear, Nose, and Throat:  Antihistamines Passed - 09/02/2020  2:47 PM      Passed - Valid encounter within last 12 months    Recent Outpatient Visits          4 months ago Current 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 episode [facility])   [facility] [place], [place]   5 months ago Right arm pain   [place], [place], [place]   5 months ago Hypothyroidism, unspecified type   [facility], [place], [place]   6 months ago Other specified hypothyroidism   [facility] [place], [place]   10 months ago Hypothyroidism, unspecified type   [facility], [place], [place]      Future Appointments            In 1 month Cannady, Barbaraann Faster, NP [employer]

## 2020-10-27 ENCOUNTER — Encounter: Payer: Self-pay | Admitting: Nurse Practitioner

## 2020-10-27 DIAGNOSIS — I700 Atherosclerosis of aorta: Secondary | ICD-10-CM | POA: Insufficient documentation

## 2020-10-31 ENCOUNTER — Other Ambulatory Visit: Payer: Self-pay

## 2020-10-31 ENCOUNTER — Encounter: Payer: 59 | Admitting: Family Medicine

## 2020-10-31 ENCOUNTER — Ambulatory Visit (INDEPENDENT_AMBULATORY_CARE_PROVIDER_SITE_OTHER): Payer: 59 | Admitting: Nurse Practitioner

## 2020-10-31 ENCOUNTER — Encounter: Payer: Self-pay | Admitting: Nurse Practitioner

## 2020-10-31 VITALS — BP 114/78 | HR 82 | Temp 98.0°F | Ht 66.54 in | Wt 292.6 lb

## 2020-10-31 DIAGNOSIS — Z0000 Encounter for general adult medical examination without abnormal findings: Secondary | ICD-10-CM | POA: Diagnosis not present

## 2020-10-31 DIAGNOSIS — I700 Atherosclerosis of aorta: Secondary | ICD-10-CM

## 2020-10-31 DIAGNOSIS — R7301 Impaired fasting glucose: Secondary | ICD-10-CM

## 2020-10-31 DIAGNOSIS — E782 Mixed hyperlipidemia: Secondary | ICD-10-CM

## 2020-10-31 DIAGNOSIS — F320 Major depressive disorder, single episode, mild: Secondary | ICD-10-CM | POA: Diagnosis not present

## 2020-10-31 DIAGNOSIS — Z23 Encounter for immunization: Secondary | ICD-10-CM | POA: Diagnosis not present

## 2020-10-31 DIAGNOSIS — I10 Essential (primary) hypertension: Secondary | ICD-10-CM | POA: Diagnosis not present

## 2020-10-31 DIAGNOSIS — F17210 Nicotine dependence, cigarettes, uncomplicated: Secondary | ICD-10-CM

## 2020-10-31 DIAGNOSIS — F419 Anxiety disorder, unspecified: Secondary | ICD-10-CM

## 2020-10-31 DIAGNOSIS — E038 Other specified hypothyroidism: Secondary | ICD-10-CM

## 2020-10-31 MED ORDER — VENLAFAXINE HCL ER 150 MG PO CP24: 150.0000 mg | ORAL_CAPSULE | Freq: Every day | ORAL | 4 refills | Status: DC

## 2020-10-31 MED ORDER — BUPROPION HCL ER (XL) 150 MG PO TB24: 150.0000 mg | ORAL_TABLET | Freq: Every day | ORAL | 5 refills | Status: DC

## 2020-10-31 MED ORDER — LEVOTHYROXINE SODIUM 150 MCG PO TABS: 150.0000 ug | ORAL_TABLET | Freq: Every day | ORAL | 4 refills | Status: DC

## 2020-10-31 MED ORDER — VALACYCLOVIR HCL 1 G PO TABS: 1000.0000 mg | ORAL_TABLET | Freq: Every day | ORAL | 4 refills | Status: DC

## 2020-10-31 NOTE — Assessment & Plan Note (Signed)
I have recommended complete cessation of tobacco use. I have discussed various options available for assistance with tobacco cessation including over the counter methods (Nicotine gum, patch and lozenges). We also discussed prescription options (Chantix, Nicotine Inhaler / Nasal Spray). The patient is not interested in pursuing any prescription tobacco cessation options at this time.  

## 2020-10-31 NOTE — Progress Notes (Signed)
BP 114/78    Pulse 82    Temp 98 F (36.7 C) (Oral)    Ht 5' 6.54" (1.69 m)    Wt 292 lb 9.6 oz (132.7 kg)    SpO2 97%    BMI 46.47 kg/m    Subjective:    Patient ID: Lynn French, female    DOB: 01-02-1971, 49 y.o.   MRN: [id number]  HPI: Lynn French is a 49 y.o. female presenting on 10/31/2020 for comprehensive medical examination. Current medical complaints include:none  She currently lives with: Menopausal Symptoms: no   HYPOTHYROIDISM Continues on Levothyroxine 150 MCG.  Last TSH 0.592 -- in June. Thyroid control status:stable Satisfied with current treatment? yes Medication side effects: no Medication compliance: good compliance Etiology of hypothyroidism:  Recent dose adjustment:no Fatigue: no Cold intolerance: no Heat intolerance: no Weight gain: no Weight loss: no Constipation: no Diarrhea/loose stools: no Palpitations: no Lower extremity edema: no Anxiety/depressed mood: no   DEPRESSION Continues on Effexor 150 MG daily.  Previously reports trying Wellbutrin, as is a smoker -- feels that this made her feet swell, but is interested in trying again.   Mood status: stable Satisfied with current treatment?: yes Symptom severity: mild  Duration of current treatment : chronic Side effects: no Medication compliance: good compliance Psychotherapy/counseling: none Depressed mood: yes Anxious mood: no Anhedonia: no Significant weight loss or gain: no Insomnia: none Fatigue: yes Feelings of worthlessness or guilt: no Impaired concentration/indecisiveness: no Suicidal ideations: no Hopelessness: no Crying spells: no Depression screen [facility] 2/9 10/31/2020 04/26/2020 03/14/2020 02/07/2020 11/02/2019  Decreased Interest 2 2 2 3 2   Down, Depressed, Hopeless 0 0 0 1 1  PHQ - 2 Score 2 2 2 4 3   Altered sleeping 2 1 1 1 3   Tired, decreased energy 2 2 1 2 2   Change in appetite 2 2 3 2 1   Feeling bad or failure about yourself  0 0 1 0 1  Trouble concentrating 1 1 2  1 1   Moving slowly or fidgety/restless 0 0 0 0 0  Suicidal thoughts 0 0 0 0 0  PHQ-9 Score 9 8 10 10 11   Difficult doing work/chores Somewhat difficult - Somewhat difficult Somewhat difficult -  Some recent data might be hidden   The patient does not have a history of falls. I did not complete a risk assessment for falls. A plan of care for falls was not documented.   Past Medical History:  Past Medical History:  Diagnosis Date   Acid reflux    Anemia    Anxiety    Atypical squamous cells of undetermined significance (ASCUS) on Papanicolaou smear of cervix April 2016   HPV Negative, next pap due April 2017   B12 deficiency 2000s   history of B12 deficiency   Diabetes mellitus without complication ([place])    Hyperlipidemia    Hypothyroidism    Obesity    Tachycardia    with palpitations, negative work up.   Tobacco use     Surgical History:  Past Surgical History:  Procedure Laterality Date   APPENDECTOMY     BLADDER SURGERY     bladder stem enlarged   CESAREAN SECTION  11/17/11   CHOLECYSTECTOMY     TUBAL LIGATION  11/17/11   TYMPANOSTOMY TUBE PLACEMENT     WISDOM TOOTH EXTRACTION      Medications:  Current Outpatient Medications on File Prior to Visit  Medication Sig   acetaminophen (TYLENOL) 500 MG tablet Take  1,000 mg by mouth every 8 (eight) hours as needed.   Cyanocobalamin (VITAMIN B-12) 5000 MCG SUBL Place 5,000 mcg under the tongue daily.   diclofenac sodium (VOLTAREN) 1 % GEL Apply 2 g topically 4 (four) times daily.   hydrochlorothiazide (HYDRODIURIL) 25 MG tablet Take 2 tablets by mouth once daily   levocetirizine (XYZAL) 5 MG tablet TAKE 1 TABLET BY MOUTH ONCE DAILY IN THE EVENING   mupirocin nasal ointment (BACTROBAN) 2 % Place 1 application into the nose 2 (two) times daily. As needed   omeprazole (PRILOSEC) 40 MG capsule Take 1 capsule by mouth once daily   ondansetron (ZOFRAN ODT) 4 MG disintegrating tablet Take 1 tablet (4 mg  total) by mouth every 8 (eight) hours as needed for nausea or vomiting.   silver sulfADIAZINE (SILVADENE) 1 % cream Apply 1 application topically 2 (two) times daily.   No current facility-administered medications on file prior to visit.    Allergies:  No Known Allergies  Social History:  Social History   Socioeconomic History   Marital status: Single    Spouse name: Not on file   Number of children: Not on file   Years of education: Not on file   Highest education level: Not on file  Occupational History   Not on file  Tobacco Use   Smoking status: Current Every Day Smoker    Packs/day: 0.50    Years: 20.00    Pack years: 10.00    Types: Cigarettes   Smokeless tobacco: Never Used  Scientific laboratory technician Use: Former   Devices: Rare Use  Substance and Sexual Activity   Alcohol use: No    Alcohol/week: 0.0 standard drinks    Comment: very rare   Drug use: No   Sexual activity: Not Currently  Other Topics Concern   Not on file  Social History Narrative   Not on file   Social Determinants of Health   Financial Resource Strain: Not on file  Food Insecurity: Not on file  Transportation Needs: Not on file  Physical Activity: Not on file  Stress: Not on file  Social Connections: Not on file  Intimate Partner Violence: Not on file   Social History   Tobacco Use  Smoking Status Current Every Day Smoker   Packs/day: 0.50   Years: 20.00   Pack years: 10.00   Types: Cigarettes  Smokeless Tobacco Never Used   Social History   Substance and Sexual Activity  Alcohol Use No   Alcohol/week: 0.0 standard drinks   Comment: very rare    Family History:  Family History  Problem Relation Age of Onset   Mental illness Mother    Cancer Maternal Grandmother        breast   Allergies Son    Eczema Son     Past medical history, surgical history, medications, allergies, family history and social history reviewed with patient today and changes  made to appropriate areas of the chart.   Review of Systems - negative All other ROS negative except what is listed above and in the HPI.      Objective:    BP 114/78    Pulse 82    Temp 98 F (36.7 C) (Oral)    Ht 5' 6.54" (1.69 m)    Wt 292 lb 9.6 oz (132.7 kg)    SpO2 97%    BMI 46.47 kg/m   Wt Readings from Last 3 Encounters:  10/31/20 292 lb 9.6 oz (132.7  kg)  04/26/20 291 lb (132 kg)  03/16/20 294 lb (133.4 kg)    Physical Exam Vitals and nursing note reviewed.  Constitutional:      General: She is awake. She is not in acute distress.    Appearance: She is well-developed and well-groomed. She is morbidly obese. She is not ill-appearing.  HENT:     Head: Normocephalic and atraumatic.     Right Ear: Hearing, tympanic membrane, ear canal and external ear normal. No drainage.     Left Ear: Hearing, tympanic membrane, ear canal and external ear normal. No drainage.     Nose: Nose normal.     Right Sinus: No maxillary sinus tenderness or frontal sinus tenderness.     Left Sinus: No maxillary sinus tenderness or frontal sinus tenderness.     Mouth/Throat:     Mouth: Mucous membranes are moist.     Pharynx: Oropharynx is clear. Uvula midline. No pharyngeal swelling, oropharyngeal exudate or posterior oropharyngeal erythema.  Eyes:     General: Lids are normal.        Right eye: No discharge.        Left eye: No discharge.     Extraocular Movements: Extraocular movements intact.     Conjunctiva/sclera: Conjunctivae normal.     Pupils: Pupils are equal, round, and reactive to light.     Visual Fields: Right eye visual fields normal and left eye visual fields normal.  Neck:     Thyroid: No thyromegaly.     Vascular: No carotid bruit.     Trachea: Trachea normal.  Cardiovascular:     Rate and Rhythm: Normal rate and regular rhythm.     Heart sounds: Normal heart sounds. No murmur heard. No gallop.   Pulmonary:     Effort: Pulmonary effort is normal. No accessory muscle usage  or respiratory distress.     Breath sounds: Normal breath sounds.  Chest:  Breasts:     Right: Normal. No axillary adenopathy or supraclavicular adenopathy.     Left: Normal. No axillary adenopathy or supraclavicular adenopathy.    Abdominal:     General: Bowel sounds are normal.     Palpations: Abdomen is soft. There is no hepatomegaly or splenomegaly.     Tenderness: There is no abdominal tenderness.  Musculoskeletal:        General: Normal range of motion.     Cervical back: Normal range of motion and neck supple.     Right lower leg: No edema.     Left lower leg: No edema.  Lymphadenopathy:     Head:     Right side of head: No submental, submandibular, tonsillar, preauricular or posterior auricular adenopathy.     Left side of head: No submental, submandibular, tonsillar, preauricular or posterior auricular adenopathy.     Cervical: No cervical adenopathy.     Upper Body:     Right upper body: No supraclavicular, axillary or pectoral adenopathy.     Left upper body: No supraclavicular, axillary or pectoral adenopathy.  Skin:    General: Skin is warm and dry.     Capillary Refill: Capillary refill takes less than 2 seconds.     Findings: No rash.  Neurological:     Mental Status: She is alert and oriented to person, place, and time.     Cranial Nerves: Cranial nerves are intact.     Gait: Gait is intact.     Deep Tendon Reflexes: Reflexes are normal and symmetric.  Reflex Scores:      Brachioradialis reflexes are 2+ on the right side and 2+ on the left side.      Patellar reflexes are 2+ on the right side and 2+ on the left side. Psychiatric:        Attention and Perception: Attention normal.        Mood and Affect: Mood normal.        Speech: Speech normal.        Behavior: Behavior normal. Behavior is cooperative.        Thought Content: Thought content normal.        Judgment: Judgment normal.     Results for orders placed or performed in visit on 04/26/20   Vitamin D (25 hydroxy)  Result Value Ref Range   Vit D, 25-Hydroxy 19.8 (L) 30.0 - 100.0 ng/mL  TSH  Result Value Ref Range   TSH 0.592 0.450 - 4.500 uIU/mL  Lipid Panel w/o Chol/HDL Ratio  Result Value Ref Range   Cholesterol, Total 173 100 - 199 mg/dL   Triglycerides 228 (H) 0 - 149 mg/dL   HDL 30 (L) >39 mg/dL   VLDL Cholesterol Cal 39 5 - 40 mg/dL   LDL Chol Calc (NIH) 104 (H) 0 - 99 mg/dL  HgB A1c  Result Value Ref Range   Hgb A1c MFr Bld 6.1 (H) 4.8 - 5.6 %   Est. average glucose Bld gHb Est-mCnc 128 mg/dL      Assessment & Plan:   Problem List Items Addressed This Visit      Cardiovascular and Mediastinum   Benign hypertension    Chronic, stable with BP at goal today.  Recommend she monitor BP at least a few mornings a week at home and document.  DASH diet at home.  Continue current medication regimen and adjust as needed.  Labs today.  Return in 6 months.       Relevant Orders   CBC with Differential/Platelet   Comprehensive metabolic panel   Aortic atherosclerosis ([place])    Noted on CT 12/01/19.  Consider addition of statin in future if elevations in LDL.  Recommend complete cessation of smoking.          Endocrine   Hypothyroid    Chronic, ongoing.  Continue current medication regimen and adjust as needed Free T4 and TSH today.         Relevant Medications   levothyroxine (SYNTHROID) 150 MCG tablet   Other Relevant Orders   TSH   T4, free   IFG (impaired fasting glucose)    A1c check today.      Relevant Orders   HgB A1c     Other   Anxiety    Chronic, ongoing.  Will trial Wellbutrin again to regimen and if edema to legs she is aware to stop taking.  Script for Wellbutrin 150 MG daily.  Continue Effexor as ordered and consider reduction to discontinuation in future if tolerating Wellbutrin.  Wellbutrin would benefit smoking cessation.  Return in 6 weeks for follow-up.      Relevant Medications   venlafaxine XR (EFFEXOR XR) 150 MG 24 hr capsule    buPROPion (WELLBUTRIN XL) 150 MG 24 hr tablet   Nicotine dependence, cigarettes, uncomplicated    I have recommended complete cessation of tobacco use. I have discussed various options available for assistance with tobacco cessation including over the counter methods (Nicotine gum, patch and lozenges). We also discussed prescription options (Chantix, Nicotine Inhaler / Nasal Spray).  The patient is not interested in pursuing any prescription tobacco cessation options at this time.       Hyperlipidemia    Lipid panel today and initiate medication as needed.      Relevant Orders   Lipid Panel w/o Chol/HDL Ratio   Morbid obesity (H[facility])    Recommended eating smaller high protein, low fat meals more frequently and exercising 30 mins a day 5 times a week with a goal of 10-15lb weight loss in the next 3 months. Patient voiced their understanding and motivation to adhere to these recommendations.       Depression - Primary    Chronic, ongoing.  Denies SI/HI.  Will trial Wellbutrin again to regimen and if edema to legs she is aware to stop taking.  Script for Wellbutrin 150 MG daily.  Continue Effexor as ordered and consider reduction to discontinuation in future if tolerating Wellbutrin.  Wellbutrin would benefit smoking cessation.  Return in 6 weeks for follow-up.      Relevant Medications   venlafaxine XR (EFFEXOR XR) 150 MG 24 hr capsule   buPROPion (WELLBUTRIN XL) 150 MG 24 hr tablet    Other Visit Diagnoses    Need for influenza vaccination       Relevant Orders   Flu Vaccine QUAD 6+ mos PF IM (Fluarix Quad PF)   Encounter for annual physical exam       Annual labs today.       Follow up plan: Return in about 6 weeks (around 12/12/2020) for MOOD -- meet new PCP.   LABORATORY TESTING:  - Pap smear: up to date  IMMUNIZATIONS:   - Tdap: Tetanus vaccination status reviewed: last tetanus booster within 10 years. - Influenza: Ordered today - Pneumovax: Not applicable - Prevnar: Not  applicable - HPV: Not applicable - Zostavax vaccine: Not applicable  SCREENING: -Mammogram: Ordered today  - Colonoscopy: will look into insurance coverage - Bone Density: Not applicable  -Hearing Test: Not applicable  -Spirometry: Not applicable   PATIENT COUNSELING:   Advised to take 1 mg of folate supplement per day if capable of pregnancy.   Sexuality: Discussed sexually transmitted diseases, partner selection, use of condoms, avoidance of unintended pregnancy  and contraceptive alternatives.   Advised to avoid cigarette smoking.  I discussed with the patient that most people either abstain from alcohol or drink within safe limits (<=14/week and <=4 drinks/occasion for males, <=7/weeks and <= 3 drinks/occasion for females) and that the risk for alcohol disorders and other health effects rises proportionally with the number of drinks per week and how often a drinker exceeds daily limits.  Discussed cessation/primary prevention of drug use and availability of treatment for abuse.   Diet: Encouraged to adjust caloric intake to maintain  or achieve ideal body weight, to reduce intake of dietary saturated fat and total fat, to limit sodium intake by avoiding high sodium foods and not adding table salt, and to maintain adequate dietary potassium and calcium preferably from fresh fruits, vegetables, and low-fat dairy products.    Stressed the importance of regular exercise  Injury prevention: Discussed safety belts, safety helmets, smoke detector, smoking near bedding or upholstery.   Dental health: Discussed importance of regular tooth brushing, flossing, and dental visits.    NEXT PREVENTATIVE PHYSICAL DUE IN 1 YEAR. Return in about 6 weeks (around 12/12/2020) for MOOD -- meet new PCP.

## 2020-10-31 NOTE — Patient Instructions (Addendum)
[facility]  Address: [address], [place], [place] [address]  Phone: [phone]   Mammogram A mammogram is an X-ray of the breasts that is done to check for changes that are not normal. This test can screen for and find any changes that may suggest breast cancer. Mammograms are regularly done on women. A man may have a mammogram if he has a lump or swelling in his breast. This test can also help to find other changes and variations in the breast. Tell a doctor:  About any allergies you have.  If you have breast implants.  If you have had previous breast disease, biopsy, or surgery.  If you are breastfeeding.  If you are younger than age 48.  If you have a family history of breast cancer.  Whether you are pregnant or may be pregnant. What are the risks? Generally, this is a safe procedure. However, problems may occur, including:  Exposure to radiation. Radiation levels are very low with this test.  The results being misinterpreted.  The need for further tests.  The inability of the mammogram to detect certain cancers. What happens before the procedure?  Have this test done about 1-2 weeks after your period. This is usually when your breasts are the least tender.  If you are visiting a new doctor or clinic, send any past mammogram images to your new doctor's office.  Wash your breasts and under your arms the day of the test.  Do not use deodorants, perfumes, lotions, or powders on the day of the test.  Take off any jewelry from your neck.  Wear clothes that you can change into and out of easily. What happens during the procedure?   You will undress from the waist up. You will put on a gown.  You will stand in front of the X-ray machine.  Each breast will be placed between two plastic or glass plates. The plates will press down on your breast for a few seconds. Try to stay as relaxed as possible. This does not cause any  harm to your breasts. Any discomfort you feel will be very brief.  X-rays will be taken from different angles of each breast. The procedure may vary among doctors and hospitals. What happens after the procedure?  The mammogram will be read by a specialist (radiologist).  You may need to do certain parts of the test again. This depends on the quality of the images.  Ask when your test results will be ready. Make sure you get your test results.  You may go back to your normal activities. Summary  A mammogram is a low energy X-ray of the breasts that is done to check for abnormal changes. A man may have this test if he has a lump or swelling in his breast.  Before the procedure, tell your doctor about any breast problems that you have had in the past.  Have this test done about 1-2 weeks after your period.  For the test, each breast will be placed between two plastic or glass plates. The plates will press down on your breast for a few seconds.  The mammogram will be read by a specialist (radiologist). Ask when your test results will be ready. Make sure you get your test results. This information is not intended to replace advice given to you by your health care provider. Make sure you discuss any questions you have with your health care provider. Document Revised: 06/10/2018 Document  Reviewed: 06/10/2018 Elsevier Patient Education  [place] Maintenance, Female Adopting a healthy lifestyle and getting preventive care are important in promoting health and wellness. Ask your health care provider about:  The right schedule for you to have regular tests and exams.  Things you can do on your own to prevent diseases and keep yourself healthy. What should I know about diet, weight, and exercise? Eat a healthy diet   Eat a diet that includes plenty of vegetables, fruits, low-fat dairy products, and lean protein.  Do not eat a lot of foods that are high in solid fats,  added sugars, or sodium. Maintain a healthy weight Body mass index (BMI) is used to identify weight problems. It estimates body fat based on height and weight. Your health care provider can help determine your BMI and help you achieve or maintain a healthy weight. Get regular exercise Get regular exercise. This is one of the most important things you can do for your health. Most adults should:  Exercise for at least 150 minutes each week. The exercise should increase your heart rate and make you sweat (moderate-intensity exercise).  Do strengthening exercises at least twice a week. This is in addition to the moderate-intensity exercise.  Spend less time sitting. Even light physical activity can be beneficial. Watch cholesterol and blood lipids Have your blood tested for lipids and cholesterol at 47 years of age, then have this test every 5 years. Have your cholesterol levels checked more often if:  Your lipid or cholesterol levels are high.  You are older than 47 years of age.  You are at high risk for heart disease. What should I know about cancer screening? Depending on your health history and family history, you may need to have cancer screening at various ages. This may include screening for:  Breast cancer.  Cervical cancer.  Colorectal cancer.  Skin cancer.  Lung cancer. What should I know about heart disease, diabetes, and high blood pressure? Blood pressure and heart disease  High blood pressure causes heart disease and increases the risk of stroke. This is more likely to develop in people who have high blood pressure readings, are of African descent, or are overweight.  Have your blood pressure checked: ? Every 3-5 years if you are 6-75 years of age. ? Every year if you are 45 years old or older. Diabetes Have regular diabetes screenings. This checks your fasting blood sugar level. Have the screening done:  Once every three years after age 62 if you are at a  normal weight and have a low risk for diabetes.  More often and at a younger age if you are overweight or have a high risk for diabetes. What should I know about preventing infection? Hepatitis B If you have a higher risk for hepatitis B, you should be screened for this virus. Talk with your health care provider to find out if you are at risk for hepatitis B infection. Hepatitis C Testing is recommended for:  Everyone born from [birth year] through 1963.  Anyone with known risk factors for hepatitis C. Sexually transmitted infections (STIs)  Get screened for STIs, including gonorrhea and chlamydia, if: ? You are sexually active and are younger than 47 years of age. ? You are older than 47 years of age and your health care provider tells you that you are at risk for this type of infection. ? Your sexual activity has changed since you were last screened, and you are at  increased risk for chlamydia or gonorrhea. Ask your health care provider if you are at risk.  Ask your health care provider about whether you are at high risk for HIV. Your health care provider may recommend a prescription medicine to help prevent HIV infection. If you choose to take medicine to prevent HIV, you should first get tested for HIV. You should then be tested every 3 months for as long as you are taking the medicine. Pregnancy  If you are about to stop having your period (premenopausal) and you may become pregnant, seek counseling before you get pregnant.  Take 400 to 800 micrograms (mcg) of folic acid every day if you become pregnant.  Ask for birth control (contraception) if you want to prevent pregnancy. Osteoporosis and menopause Osteoporosis is a disease in which the bones lose minerals and strength with aging. This can result in bone fractures. If you are 38 years old or older, or if you are at risk for osteoporosis and fractures, ask your health care provider if you should:  Be screened for bone loss.  Take a  calcium or vitamin D supplement to lower your risk of fractures.  Be given hormone replacement therapy (HRT) to treat symptoms of menopause. Follow these instructions at home: Lifestyle  Do not use any products that contain nicotine or tobacco, such as cigarettes, e-cigarettes, and chewing tobacco. If you need help quitting, ask your health care provider.  Do not use street drugs.  Do not share needles.  Ask your health care provider for help if you need support or information about quitting drugs. Alcohol use  Do not drink alcohol if: ? Your health care provider tells you not to drink. ? You are pregnant, may be pregnant, or are planning to become pregnant.  If you drink alcohol: ? Limit how much you use to 0-1 drink a day. ? Limit intake if you are breastfeeding.  Be aware of how much alcohol is in your drink. In the [place] one drink equals one 12 oz bottle of beer (355 mL), one 5 oz glass of wine (148 mL), or one 1 oz glass of hard liquor (44 mL). General instructions  Schedule regular health, dental, and eye exams.  Stay current with your vaccines.  Tell your health care provider if: ? You often feel depressed. ? You have ever been abused or do not feel safe at home. Summary  Adopting a healthy lifestyle and getting preventive care are important in promoting health and wellness.  Follow your health care provider's instructions about healthy diet, exercising, and getting tested or screened for diseases.  Follow your health care provider's instructions on monitoring your cholesterol and blood pressure. This information is not intended to replace advice given to you by your health care provider. Make sure you discuss any questions you have with your health care provider. Document Revised: 10/13/2018 Document Reviewed: 10/13/2018 Elsevier Patient Education  2020 [employer].

## 2020-10-31 NOTE — Assessment & Plan Note (Signed)
Chronic, ongoing.  Denies SI/HI.  Will trial Wellbutrin again to regimen and if edema to legs she is aware to stop taking.  Script for Wellbutrin 150 MG daily.  Continue Effexor as ordered and consider reduction to discontinuation in future if tolerating Wellbutrin.  Wellbutrin would benefit smoking cessation.  Return in 6 weeks for follow-up.

## 2020-10-31 NOTE — Assessment & Plan Note (Signed)
Chronic, ongoing.  Will trial Wellbutrin again to regimen and if edema to legs she is aware to stop taking.  Script for Wellbutrin 150 MG daily.  Continue Effexor as ordered and consider reduction to discontinuation in future if tolerating Wellbutrin.  Wellbutrin would benefit smoking cessation.  Return in 6 weeks for follow-up.

## 2020-10-31 NOTE — Assessment & Plan Note (Signed)
Recommended eating smaller high protein, low fat meals more frequently and exercising 30 mins a day 5 times a week with a goal of 10-15lb weight loss in the next 3 months. Patient voiced their understanding and motivation to adhere to these recommendations.  

## 2020-10-31 NOTE — Assessment & Plan Note (Signed)
Chronic, stable with BP at goal today.  Recommend she monitor BP at least a few mornings a week at home and document.  DASH diet at home.  Continue current medication regimen and adjust as needed.  Labs today.  Return in 6 months.

## 2020-10-31 NOTE — Assessment & Plan Note (Signed)
Lipid panel today and initiate medication as needed.

## 2020-10-31 NOTE — Assessment & Plan Note (Signed)
A1c check today.

## 2020-10-31 NOTE — Assessment & Plan Note (Signed)
Noted on CT 12/01/19.  Consider addition of statin in future if elevations in LDL.  Recommend complete cessation of smoking.

## 2020-10-31 NOTE — Assessment & Plan Note (Signed)
Chronic, ongoing.  Continue current medication regimen and adjust as needed Free T4 and TSH today.

## 2020-11-01 LAB — COMPREHENSIVE METABOLIC PANEL
ALT: 14 IU/L (ref 0–32)
AST: 24 IU/L (ref 0–40)
Albumin/Globulin Ratio: 1.2 (ref 1.2–2.2)
Albumin: 3.8 g/dL (ref 3.8–4.8)
Alkaline Phosphatase: 114 IU/L (ref 44–121)
BUN/Creatinine Ratio: 11 (ref 9–23)
BUN: 8 mg/dL (ref 6–24)
Bilirubin Total: <0.2 mg/dL (ref 0.0–1.2)
CO2: 27 mmol/L (ref 20–29)
Calcium: 9.2 mg/dL (ref 8.7–10.2)
Chloride: 98 mmol/L (ref 96–106)
Creatinine, Ser: 0.76 mg/dL (ref 0.57–1.00)
GFR calc Af Amer: 108 mL/min/{1.73_m2} (ref >59)
GFR calc non Af Amer: 94 mL/min/{1.73_m2} (ref >59)
Globulin, Total: 3.1 g/dL (ref 1.5–4.5)
Glucose: 79 mg/dL (ref 65–99)
Potassium: 4.3 mmol/L (ref 3.5–5.2)
Sodium: 138 mmol/L (ref 134–144)
Total Protein: 6.9 g/dL (ref 6.0–8.5)

## 2020-11-01 LAB — CBC WITH DIFFERENTIAL/PLATELET
Basophils Absolute: 0.1 10*3/uL (ref 0.0–0.2)
Basos: 1 %
EOS (ABSOLUTE): 0.4 10*3/uL (ref 0.0–0.4)
Eos: 5 %
Hematocrit: 34.1 % (ref 34.0–46.6)
Hemoglobin: 11.1 g/dL (ref 11.1–15.9)
Immature Grans (Abs): 0 10*3/uL (ref 0.0–0.1)
Immature Granulocytes: 0 %
Lymphocytes Absolute: 3.2 10*3/uL — ABNORMAL HIGH (ref 0.7–3.1)
Lymphs: 39 %
MCH: 25 pg — ABNORMAL LOW (ref 26.6–33.0)
MCHC: 32.6 g/dL (ref 31.5–35.7)
MCV: 77 fL — ABNORMAL LOW (ref 79–97)
Monocytes Absolute: 0.5 10*3/uL (ref 0.1–0.9)
Monocytes: 6 %
Neutrophils Absolute: 4.1 10*3/uL (ref 1.4–7.0)
Neutrophils: 49 %
Platelets: 369 10*3/uL (ref 150–450)
RBC: 4.44 x10E6/uL (ref 3.77–5.28)
RDW: 16.1 % — ABNORMAL HIGH (ref 11.7–15.4)
WBC: 8.3 10*3/uL (ref 3.4–10.8)

## 2020-11-01 LAB — T4, FREE: Free T4: 1.38 ng/dL (ref 0.82–1.77)

## 2020-11-01 LAB — LIPID PANEL W/O CHOL/HDL RATIO
Cholesterol, Total: 190 mg/dL (ref 100–199)
HDL: 39 mg/dL — ABNORMAL LOW (ref >39)
LDL Chol Calc (NIH): 128 mg/dL — ABNORMAL HIGH (ref 0–99)
Triglycerides: 128 mg/dL (ref 0–149)
VLDL Cholesterol Cal: 23 mg/dL (ref 5–40)

## 2020-11-01 LAB — HEMOGLOBIN A1C
Est. average glucose Bld gHb Est-mCnc: 128 mg/dL
Hgb A1c MFr Bld: 6.1 % — ABNORMAL HIGH (ref 4.8–5.6)

## 2020-11-01 LAB — TSH: TSH: 1.23 u[IU]/mL (ref 0.450–4.500)

## 2020-11-01 NOTE — Progress Notes (Signed)
Contacted via MyChart The 10-year ASCVD risk score Mikey Bussing DC Jr., et al., 2013) is: 4.9%   Values used to calculate the score:     Age: 49 years     Sex: Female     Is Non-Hispanic African American: No     Diabetic: No     Tobacco smoker: Yes     Systolic Blood Pressure: 671 mmHg     Is BP treated: Yes     HDL Cholesterol: 39 mg/dL     Total Cholesterol: 190 mg/dL   Good evening Lynn French, your labs have returned and overall they remain stable.  Can continue all current medication dose.  The A1C is the diabetes testing we talked about, this looks at your blood sugars over the past 3 months and turns the average into a number.  Your number is 6.1%, meaning you are prediabetic.  Any number 5.7 to 6.4 is considered prediabetes and any number 6.5 or greater is considered diabetes.   I would recommend heavy focus on decreasing foods high in sugar and your intake of things like bread products, pasta, and rice.  The American Diabetes Association online has a large amount of information on diet changes to make.  We will recheck this number in 3 months to ensure you are not continuing to trend upwards and move into diabetes.  Cholesterol levels continue to show elevation in LDL, bad cholesterol, I recommend continued focus on diet and regular exercise.  Any questions? Keep being awesome!!  Thank you for allowing me to participate in your care. Kindest regards, Cheyann Blecha

## 2020-11-12 ENCOUNTER — Telehealth (INDEPENDENT_AMBULATORY_CARE_PROVIDER_SITE_OTHER): Payer: 59 | Admitting: Family Medicine

## 2020-11-12 ENCOUNTER — Encounter: Payer: Self-pay | Admitting: Family Medicine

## 2020-11-12 ENCOUNTER — Telehealth: Payer: Self-pay

## 2020-11-12 VITALS — Wt 281.8 lb

## 2020-11-12 DIAGNOSIS — U071 COVID-19: Secondary | ICD-10-CM | POA: Diagnosis not present

## 2020-11-12 MED ORDER — PREDNISONE 50 MG PO TABS: 50.0000 mg | ORAL_TABLET | Freq: Every day | ORAL | 0 refills | Status: DC

## 2020-11-12 MED ORDER — BENZONATATE 200 MG PO CAPS: 200.0000 mg | ORAL_CAPSULE | Freq: Two times a day (BID) | ORAL | 0 refills | Status: DC | PRN

## 2020-11-12 NOTE — Telephone Encounter (Signed)
mychart visit scheduled  Copied from [place] [phone]. Topic: General - Other >> Nov 12, 2020  9:19 AM Keene Breath wrote: Reason for CRM: Patient would like the nurse to call her to advise on what she should do next after testing positive for covid with a home test.  She is not sure of her next steps.  CB# [phone]

## 2020-11-12 NOTE — Telephone Encounter (Signed)
Called pt to give information for testing site for her son no answer left vm. If pt calls back please give her this number [phone] or [email or web address]

## 2020-11-12 NOTE — Progress Notes (Signed)
Wt 281 lb 12.8 oz (127.8 kg)   LMP 10/24/2020 (Exact Date)   BMI 44.76 kg/m    Subjective:    Patient ID: Lynn French, female    DOB: [birth year], 50 y.o.   MRN: [id number]  HPI: Lynn French is a 50 y.o. female  Chief Complaint  Patient presents with  . Covid Positive    Pt states she woke up Saturday morning with a cough and congestion. Started having diarrhea yesterday and body aches. States she had positive home covid test yesterday evening.    UPPER RESPIRATORY TRACT INFECTION Duration: 2.5 days ago Worst symptom: cough, congestion and body aches Fever: no Cough: yes Shortness of breath: no Wheezing: no Chest pain: no Chest tightness: no Chest congestion: no Nasal congestion: yes Runny nose: yes Post nasal drip: yes Sneezing: no Sore throat: no Swollen glands: no Sinus pressure: no Headache: no Face pain: no Toothache: no Ear pain: no  Ear pressure: no  Eyes red/itching:no Eye drainage/crusting: no  Vomiting: no Rash: no Fatigue: yes Sick contacts: yes Strep contacts: no  Context: better Recurrent sinusitis: no Relief with OTC cold/cough medications: no  Treatments attempted: anti-histamine   Relevant past medical, surgical, family and social history reviewed and updated as indicated. Interim medical history since our last visit reviewed. Allergies and medications reviewed and updated.  Review of Systems  Constitutional: Negative.   HENT: Positive for congestion, postnasal drip and rhinorrhea. Negative for dental problem, drooling, ear discharge, ear pain, facial swelling, hearing loss, mouth sores, nosebleeds, sinus pressure, sinus pain, sneezing, sore throat, tinnitus, trouble swallowing and voice change.   Eyes: Negative.   Respiratory: Positive for cough. Negative for apnea, choking, chest tightness, shortness of breath, wheezing and stridor.   Cardiovascular: Negative.   Gastrointestinal: Negative.   Psychiatric/Behavioral: Negative.      Per HPI unless specifically indicated above     Objective:    Wt 281 lb 12.8 oz (127.8 kg)   LMP 10/24/2020 (Exact Date)   BMI 44.76 kg/m   Wt Readings from Last 3 Encounters:  11/12/20 281 lb 12.8 oz (127.8 kg)  10/31/20 292 lb 9.6 oz (132.7 kg)  04/26/20 291 lb (132 kg)    Physical Exam Vitals and nursing note reviewed.  Constitutional:      General: She is not in acute distress.    Appearance: Normal appearance. She is not ill-appearing, toxic-appearing or diaphoretic.  HENT:     Head: Normocephalic and atraumatic.     Right Ear: External ear normal.     Left Ear: External ear normal.     Nose: Nose normal.     Mouth/Throat:     Mouth: Mucous membranes are moist.     Pharynx: Oropharynx is clear.  Eyes:     General: No scleral icterus.       Right eye: No discharge.        Left eye: No discharge.     Extraocular Movements: Extraocular movements intact.     Conjunctiva/sclera: Conjunctivae normal.     Pupils: Pupils are equal, round, and reactive to light.  Cardiovascular:     Rate and Rhythm: Normal rate and regular rhythm.     Pulses: Normal pulses.     Heart sounds: Normal heart sounds. No murmur heard. No friction rub. No gallop.   Pulmonary:     Effort: Pulmonary effort is normal. No respiratory distress.     Breath sounds: Normal breath sounds. No stridor. No wheezing, rhonchi or  rales.  Chest:     Chest wall: No tenderness.  Musculoskeletal:        General: Normal range of motion.     Cervical back: Normal range of motion and neck supple.  Skin:    General: Skin is warm and dry.     Capillary Refill: Capillary refill takes less than 2 seconds.     Coloration: Skin is not jaundiced or pale.     Findings: No bruising, erythema, lesion or rash.  Neurological:     General: No focal deficit present.     Mental Status: She is alert and oriented to person, place, and time. Mental status is at baseline.  Psychiatric:        Mood and Affect: Mood normal.         Behavior: Behavior normal.        Thought Content: Thought content normal.        Judgment: Judgment normal.     Results for orders placed or performed in visit on 10/31/20  CBC with Differential/Platelet  Result Value Ref Range   WBC 8.3 3.4 - 10.8 x10E3/uL   RBC 4.44 3.77 - 5.28 x10E6/uL   Hemoglobin 11.1 11.1 - 15.9 g/dL   Hematocrit 34.1 34.0 - 46.6 %   MCV 77 (L) 79 - 97 fL   MCH 25.0 (L) 26.6 - 33.0 pg   MCHC 32.6 31.5 - 35.7 g/dL   RDW 16.1 (H) 11.7 - 15.4 %   Platelets 369 150 - 450 x10E3/uL   Neutrophils 49 Not Estab. %   Lymphs 39 Not Estab. %   Monocytes 6 Not Estab. %   Eos 5 Not Estab. %   Basos 1 Not Estab. %   Neutrophils Absolute 4.1 1.4 - 7.0 x10E3/uL   Lymphocytes Absolute 3.2 (H) 0.7 - 3.1 x10E3/uL   Monocytes Absolute 0.5 0.1 - 0.9 x10E3/uL   EOS (ABSOLUTE) 0.4 0.0 - 0.4 x10E3/uL   Basophils Absolute 0.1 0.0 - 0.2 x10E3/uL   Immature Granulocytes 0 Not Estab. %   Immature Grans (Abs) 0.0 0.0 - 0.1 x10E3/uL  Comprehensive metabolic panel  Result Value Ref Range   Glucose 79 65 - 99 mg/dL   BUN 8 6 - 24 mg/dL   Creatinine, Ser 0.76 0.57 - 1.00 mg/dL   GFR calc non Af Amer 94 >59 mL/min/1.73   GFR calc Af Amer 108 >59 mL/min/1.73   BUN/Creatinine Ratio 11 9 - 23   Sodium 138 134 - 144 mmol/L   Potassium 4.3 3.5 - 5.2 mmol/L   Chloride 98 96 - 106 mmol/L   CO2 27 20 - 29 mmol/L   Calcium 9.2 8.7 - 10.2 mg/dL   Total Protein 6.9 6.0 - 8.5 g/dL   Albumin 3.8 3.8 - 4.8 g/dL   Globulin, Total 3.1 1.5 - 4.5 g/dL   Albumin/Globulin Ratio 1.2 1.2 - 2.2   Bilirubin Total <0.2 0.0 - 1.2 mg/dL   Alkaline Phosphatase 114 44 - 121 IU/L   AST 24 0 - 40 IU/L   ALT 14 0 - 32 IU/L  Lipid Panel w/o Chol/HDL Ratio  Result Value Ref Range   Cholesterol, Total 190 100 - 199 mg/dL   Triglycerides 128 0 - 149 mg/dL   HDL 39 (L) >39 mg/dL   VLDL Cholesterol Cal 23 5 - 40 mg/dL   LDL Chol Calc (NIH) 128 (H) 0 - 99 mg/dL  TSH  Result Value Ref Range   TSH  1.230 0.450 -  4.500 uIU/mL  T4, free  Result Value Ref Range   Free T4 1.38 0.82 - 1.77 ng/dL  HgB A1c  Result Value Ref Range   Hgb A1c MFr Bld 6.1 (H) 4.8 - 5.6 %   Est. average glucose Bld gHb Est-mCnc 128 mg/dL      Assessment & Plan:   Problem List Items Addressed This Visit   None   Visit Diagnoses    COVID-19    -  Primary   Self-quarantine until 1/18- note given. Treat symptomatically with tessalon and prednisone. Would like PCR to confirm. Ordered today. Call with any concerns.   Relevant Orders   Novel Coronavirus, NAA ([facility])       Follow up plan: Return if symptoms worsen or fail to improve.   . This visit was completed via telephone due to the restrictions of the COVID-19 pandemic. All issues as above were discussed and addressed but no physical exam was performed. If it was felt that the patient should be evaluated in the office, they were directed there. The patient verbally consented to this visit. Patient was unable to complete an audio/visual visit due to Lack of equipment. Due to the catastrophic nature of the COVID-19 pandemic, this visit was done through audio contact only. . Location of the patient: home . Location of the provider: home . Those involved with this call:  . Provider: Park Liter, DO . CMA: Yvonna Alanis, [place] . Front Desk/Registration: Jill Side  . Time spent on call: 21 minutes on the phone discussing health concerns. 30 minutes total spent in review of patient's record and preparation of their chart.

## 2020-11-13 ENCOUNTER — Other Ambulatory Visit: Payer: 59

## 2020-11-13 DIAGNOSIS — Z20822 Contact with and (suspected) exposure to covid-19: Secondary | ICD-10-CM

## 2020-11-15 LAB — NOVEL CORONAVIRUS, NAA: SARS-CoV-2, NAA: DETECTED — AB

## 2020-11-15 LAB — SARS-COV-2, NAA 2 DAY TAT

## 2020-11-18 ENCOUNTER — Other Ambulatory Visit: Payer: Self-pay | Admitting: Nurse Practitioner

## 2021-02-14 ENCOUNTER — Encounter: Payer: Self-pay | Admitting: Nurse Practitioner

## 2021-02-14 ENCOUNTER — Other Ambulatory Visit: Payer: Self-pay

## 2021-02-14 ENCOUNTER — Ambulatory Visit (INDEPENDENT_AMBULATORY_CARE_PROVIDER_SITE_OTHER): Payer: 59 | Admitting: Nurse Practitioner

## 2021-02-14 VITALS — BP 147/81 | HR 88 | Temp 98.3°F | Wt 293.6 lb

## 2021-02-14 DIAGNOSIS — F419 Anxiety disorder, unspecified: Secondary | ICD-10-CM | POA: Diagnosis not present

## 2021-02-14 DIAGNOSIS — F320 Major depressive disorder, single episode, mild: Secondary | ICD-10-CM

## 2021-02-14 DIAGNOSIS — N393 Stress incontinence (female) (male): Secondary | ICD-10-CM

## 2021-02-14 DIAGNOSIS — K432 Incisional hernia without obstruction or gangrene: Secondary | ICD-10-CM | POA: Diagnosis not present

## 2021-02-14 MED ORDER — MIRABEGRON ER 25 MG PO TB24: 25.0000 mg | ORAL_TABLET | Freq: Every day | ORAL | 0 refills | Status: DC

## 2021-02-14 MED ORDER — ONDANSETRON 4 MG PO TBDP: 4.0000 mg | ORAL_TABLET | Freq: Three times a day (TID) | ORAL | 0 refills | Status: DC | PRN

## 2021-02-14 NOTE — Assessment & Plan Note (Signed)
Chronic.  Well controlled.  Continue with current regimen.  Return to clinic if symptoms worsen or fail to improve.  Otherwise follow up in 2 months.

## 2021-02-14 NOTE — Progress Notes (Signed)
BP (!) 147/81   Pulse 88   Temp 98.3 F (36.8 C) (Oral)   Wt 293 lb 9.6 oz (133.2 kg)   SpO2 97%   BMI 46.63 kg/m    Subjective:    Patient ID: Lynn French, female    DOB: 11/25/1970, 50 y.o.   MRN: [id number]  HPI: Lynn French is a 50 y.o. female  Chief Complaint  Patient presents with  . Mood Check   . Depression  . Abdominal Hernia    Patient states she would like to discuss being referred to surgery for suspected abdominal hernia. Patient states she noticed it after the birth of her son who is now 65 years old.    DEPRESSION/ANXIETY Patient states her mood is well controlled.  Patient states the Wellbutrin is working well for her.   Patient would like to continue with the Wellbutrin and the Effexor.  Patient denies any swelling in her lower extremities. Denies SI.  HERNIA Patient has noticed a knot above her pelvic bone.  She was told in the past it is scar tissue but would like to have it evaluated.  OVERACTIVE BLADDER Patient has problems with peeing herself when sneezing or coughing.  Her periods are decreasing in frequency so she has not gone into menopause completely.   Relevant past medical, surgical, family and social history reviewed and updated as indicated. Interim medical history since our last visit reviewed. Allergies and medications reviewed and updated.  Review of Systems  Gastrointestinal:       Lump above pelvic bone  Genitourinary:       Incontinence with peeing or coughing.  Psychiatric/Behavioral: Negative for dysphoric mood and suicidal ideas. The patient is not nervous/anxious.     Per HPI unless specifically indicated above     Objective:    BP (!) 147/81   Pulse 88   Temp 98.3 F (36.8 C) (Oral)   Wt 293 lb 9.6 oz (133.2 kg)   SpO2 97%   BMI 46.63 kg/m   Wt Readings from Last 3 Encounters:  02/14/21 293 lb 9.6 oz (133.2 kg)  11/12/20 281 lb 12.8 oz (127.8 kg)  10/31/20 292 lb 9.6 oz (132.7 kg)    Physical Exam Vitals  and nursing note reviewed.  Constitutional:      General: She is not in acute distress.    Appearance: Normal appearance. She is normal weight. She is not ill-appearing, toxic-appearing or diaphoretic.  HENT:     Head: Normocephalic.     Right Ear: External ear normal.     Left Ear: External ear normal.     Nose: Nose normal.     Mouth/Throat:     Mouth: Mucous membranes are moist.     Pharynx: Oropharynx is clear.  Eyes:     General:        Right eye: No discharge.        Left eye: No discharge.     Extraocular Movements: Extraocular movements intact.     Conjunctiva/sclera: Conjunctivae normal.     Pupils: Pupils are equal, round, and reactive to light.  Cardiovascular:     Rate and Rhythm: Normal rate and regular rhythm.     Heart sounds: No murmur heard.   Pulmonary:     Effort: Pulmonary effort is normal. No respiratory distress.     Breath sounds: Normal breath sounds. No wheezing or rales.  Musculoskeletal:     Cervical back: Normal range of motion and neck supple.  Skin:    General: Skin is warm and dry.     Capillary Refill: Capillary refill takes less than 2 seconds.  Neurological:     General: No focal deficit present.     Mental Status: She is alert and oriented to person, place, and time. Mental status is at baseline.  Psychiatric:        Mood and Affect: Mood normal.        Behavior: Behavior normal.        Thought Content: Thought content normal.        Judgment: Judgment normal.     Results for orders placed or performed in visit on 11/13/20  Novel Coronavirus, NAA ([facility])   Specimen: Nasopharyngeal(NP) swabs in vial transport medium   Nasopharynge  Result Value Ref Range   SARS-CoV-2, NAA Detected (A) Not Detected  SARS-COV-2, NAA 2 DAY TAT   Nasopharynge  Result Value Ref Range   SARS-CoV-2, NAA 2 DAY TAT Performed       Assessment & Plan:   Problem List Items Addressed This Visit      Other   Anxiety    Chronic.  Well controlled.   Continue with current regimen.  Return to clinic if symptoms worsen or fail to improve.  Otherwise follow up in 2 months.       Depression    Chronic.  Well controlled.  Continue with current regimen.  Return to clinic if symptoms worsen or fail to improve.  Otherwise follow up in 2 months.        Other Visit Diagnoses    Incisional hernia, without obstruction or gangrene    -  Primary   Referral placed for General Surgery.  Hernia noted on CT from ED.   Relevant Orders   Ambulatory referral to General Surgery   Stress incontinence       Begin Myrbetric.  Discussed kegel exercises. If symptoms do not improve, will send patient to Urology.  Patient agrees with plan.    Relevant Medications   mirabegron ER (MYRBETRIQ) 25 MG TB24 tablet       Follow up plan: Return in about 2 months (around 04/16/2021) for OAB.

## 2021-02-18 ENCOUNTER — Telehealth: Payer: Self-pay | Admitting: Nurse Practitioner

## 2021-02-18 MED ORDER — OXYBUTYNIN CHLORIDE ER 5 MG PO TB24: 5.0000 mg | ORAL_TABLET | Freq: Every day | ORAL | 0 refills | Status: DC

## 2021-02-18 NOTE — Telephone Encounter (Signed)
Please let patient know that Myrbetric is not covered by her insurance.  They recommended Oxybutinin which I have sent to her pharmacy.

## 2021-02-18 NOTE — Telephone Encounter (Signed)
Called and left a detailed message for patient.

## 2021-02-21 ENCOUNTER — Ambulatory Visit (INDEPENDENT_AMBULATORY_CARE_PROVIDER_SITE_OTHER): Payer: 59 | Admitting: Surgery

## 2021-02-21 ENCOUNTER — Encounter: Payer: Self-pay | Admitting: Surgery

## 2021-02-21 ENCOUNTER — Other Ambulatory Visit: Payer: Self-pay

## 2021-02-21 VITALS — BP 124/84 | HR 86 | Temp 97.4°F | Ht 67.0 in | Wt 295.4 lb

## 2021-02-21 DIAGNOSIS — K432 Incisional hernia without obstruction or gangrene: Secondary | ICD-10-CM | POA: Insufficient documentation

## 2021-02-21 DIAGNOSIS — F17210 Nicotine dependence, cigarettes, uncomplicated: Secondary | ICD-10-CM | POA: Diagnosis not present

## 2021-02-21 NOTE — Progress Notes (Signed)
Patient ID: Lynn French, female   DOB: Aug 26, 1971, 50 y.o.   MRN: [id number]  Chief Complaint:  Incisional hernia.  History of Present Illness Lynn French is a 50 y.o. female with at least a 47-monthhistory of an incisional hernia.  Never followed up with general surgeon when initially diagnosed and referred.  She reports that her pain can reach an 8 at times.  She reports that she vomits every day.  She reports being constipated and nauseated daily as well.  She also denies fevers and chills and weight loss.  She is a current smoker who seems to be poorly motivated to quit.  She has a smoker's cough and a raspy voice.  Past Medical History Past Medical History:  Diagnosis Date  . Abdominal hernia   . Acid reflux   . Anemia   . Anxiety   . Atypical squamous cells of undetermined significance (ASCUS) on Papanicolaou smear of cervix April 2016   HPV Negative, next pap due April 2017  . B12 deficiency 2000s   history of B12 deficiency  . Diabetes mellitus without complication ([place]   . Hyperlipidemia   . Hypothyroidism   . Obesity   . Tachycardia    with palpitations, negative work up.  . Tobacco use       Past Surgical History:  Procedure Laterality Date  . APPENDECTOMY    . BLADDER SURGERY     bladder stem enlarged  . CESAREAN SECTION  11/17/11  . CHOLECYSTECTOMY    . TUBAL LIGATION  11/17/11  . TYMPANOSTOMY TUBE PLACEMENT    . WISDOM TOOTH EXTRACTION      No Known Allergies  Current Outpatient Medications  Medication Sig Dispense Refill  . acetaminophen (TYLENOL) 500 MG tablet Take 1,000 mg by mouth every 8 (eight) hours as needed.    .Marland KitchenbuPROPion (WELLBUTRIN XL) 150 MG 24 hr tablet Take 1 tablet (150 mg total) by mouth daily. 30 tablet 5  . Cyanocobalamin (VITAMIN B-12) 5000 MCG SUBL Place 5,000 mcg under the tongue daily.    . diclofenac sodium (VOLTAREN) 1 % GEL Apply 2 g topically 4 (four) times daily. 100 g 2  . hydrochlorothiazide (HYDRODIURIL) 25 MG tablet  Take 2 tablets by mouth once daily 180 tablet 1  . levocetirizine (XYZAL) 5 MG tablet TAKE 1 TABLET BY MOUTH ONCE DAILY IN THE EVENING 90 tablet 0  . levothyroxine (SYNTHROID) 150 MCG tablet Take 1 tablet (150 mcg total) by mouth daily. 90 tablet 4  . omeprazole (PRILOSEC) 40 MG capsule Take 1 capsule by mouth once daily 90 capsule 0  . ondansetron (ZOFRAN ODT) 4 MG disintegrating tablet Take 1 tablet (4 mg total) by mouth every 8 (eight) hours as needed for nausea or vomiting. 20 tablet 0  . valACYclovir (VALTREX) 1000 MG tablet Take 1 tablet (1,000 mg total) by mouth daily. 90 tablet 4  . venlafaxine XR (EFFEXOR XR) 150 MG 24 hr capsule Take 1 capsule (150 mg total) by mouth daily with breakfast. 90 capsule 4  . oxybutynin (DITROPAN-XL) 5 MG 24 hr tablet Take 1 tablet (5 mg total) by mouth at bedtime. (Patient not taking: Reported on 02/21/2021) 30 tablet 0   No current facility-administered medications for this visit.    Family History Family History  Problem Relation Age of Onset  . Mental illness Mother   . Cancer Maternal Grandmother        breast  . Allergies Son   . Eczema Son  Social History Social History   Tobacco Use  . Smoking status: Current Every Day Smoker    Packs/day: 0.50    Years: 20.00    Pack years: 10.00    Types: Cigarettes  . Smokeless tobacco: Never Used  Vaping Use  . Vaping Use: Former  . Devices: Rare Use  Substance Use Topics  . Alcohol use: No    Alcohol/week: 0.0 standard drinks    Comment: very rare  . Drug use: No        Review of Systems  Constitutional: Positive for diaphoresis.  HENT: Negative.   Eyes: Negative.   Respiratory: Positive for cough.   Cardiovascular: Negative.   Gastrointestinal: Positive for abdominal pain, constipation, diarrhea, heartburn, nausea and vomiting.  Genitourinary: Positive for frequency and urgency.  Skin: Negative.   Neurological: Positive for tingling.  Psychiatric/Behavioral: Positive for  depression.      Physical Exam Blood pressure 124/84, pulse 86, temperature (!) 97.4 F (36.3 C), temperature source Oral, height 5' 7"  (1.702 m), weight 295 lb 6.4 oz (134 kg), last menstrual period 02/18/2021, SpO2 96 %. Last Weight  Most recent update: 02/21/2021  3:02 PM   Weight  134 kg (295 lb 6.4 oz)            CONSTITUTIONAL: Well developed, morbidly obese and nourished, appropriately responsive and aware without distress.   EYES: Sclera non-icteric.   EARS, NOSE, MOUTH AND THROAT: Mask worn.   Hearing is intact to voice.  NECK: Trachea is midline, and there is no jugular venous distension.  RESPIRATORY:  Lungs are clear, and breath sounds are equal bilaterally. Normal respiratory effort without pathologic use of accessory muscles. CARDIOVASCULAR: Heart is regular in rate and rhythm. GI: The abdomen is obese, there is a greater midline prominence in the inferior abdomen consistent with the hernia site, this site is soft, nontender, I have able to adequately reduce this region, and she tolerates it reasonably well. There were no other palpable masses. I can not appreciate hepatosplenomegaly. There were normal bowel sounds. MUSCULOSKELETAL:  Symmetrical muscle tone appreciated in all four extremities.    SKIN: Skin turgor is normal. No pathologic skin lesions appreciated.  NEUROLOGIC:  Motor and sensation appear grossly normal.  Cranial nerves are grossly without defect. PSYCH:  Alert and oriented to person, place and time. Affect is appropriate for situation.  Data Reviewed I have personally reviewed what is currently available of the patient's imaging, recent labs and medical records.   Labs:  CBC Latest Ref Rng & Units 10/31/2020 12/01/2019 11/02/2019  WBC 3.4 - 10.8 x10E3/uL 8.3 16.8(H) 8.5  Hemoglobin 11.1 - 15.9 g/dL 11.1 13.7 12.6  Hematocrit 34.0 - 46.6 % 34.1 44.3 39.6  Platelets 150 - 450 x10E3/uL 369 355 341   CMP Latest Ref Rng & Units 10/31/2020 12/01/2019  11/02/2019  Glucose 65 - 99 mg/dL 79 121(H) 100(H)  BUN 6 - 24 mg/dL 8 9 7   Creatinine 0.57 - 1.00 mg/dL 0.76 0.75 0.68  Sodium 134 - 144 mmol/L 138 137 139  Potassium 3.5 - 5.2 mmol/L 4.3 4.2 4.1  Chloride 96 - 106 mmol/L 98 100 97  CO2 20 - 29 mmol/L 27 28 26   Calcium 8.7 - 10.2 mg/dL 9.2 9.3 9.1  Total Protein 6.0 - 8.5 g/dL 6.9 8.6(H) 7.2  Total Bilirubin 0.0 - 1.2 mg/dL <0.2 0.6 0.3  Alkaline Phos 44 - 121 IU/L 114 113 116  AST 0 - 40 IU/L 24 36 40  ALT 0 -  32 IU/L 14 22 16      Imaging: Radiology review:    CT ABDOMEN AND PELVIS WITH CONTRAST  TECHNIQUE: Multidetector CT imaging of the abdomen and pelvis was performed using the standard protocol following bolus administration of intravenous contrast.  CONTRAST:  118m OMNIPAQUE IOHEXOL 300 MG/ML  SOLN  COMPARISON:  Abdominal radiograph dated 12/15/2012.  FINDINGS: Lower chest: No acute abnormality.  Hepatobiliary: No focal liver abnormality is seen. Status post cholecystectomy. No biliary dilatation.  Pancreas: Unremarkable. No pancreatic ductal dilatation or surrounding inflammatory changes.  Spleen: Normal in size without focal abnormality.  Adrenals/Urinary Tract: Adrenal glands are unremarkable. Other than a 2.1 cm cyst in the left kidney, the kidneys are normal, without renal calculi, focal lesion, or hydronephrosis. Bladder is unremarkable.  Stomach/Bowel: Stomach is within normal limits. Enteric contrast reaches the colon. No pericecal inflammatory changes are noted to suggest acute appendicitis. A ventral abdominal wall hernia in the lower abdomen contains a loop of small bowel. There is fluid within the hernia sac and associated fat stranding which is suggestive of strangulation. There is no evidence of bowel obstruction. There is also wall thickening and fat stranding involving a segment of the ileum proximal and distal to the bowel within the hernia sac.  Vascular/Lymphatic: Aortic  atherosclerosis. No enlarged abdominal or pelvic lymph nodes.  Reproductive: Uterus and bilateral adnexa are unremarkable.  Other: None.  Musculoskeletal: No acute or significant osseous findings.  IMPRESSION: Ventral abdominal wall hernia in the lower abdomen containing a portion of the ileum. Fat stranding and fluid within the hernia sac is concerning for strangulation, however given that there is also wall thickening and fat stranding involving the ileum proximal and distal to the segment within the hernia sac, this may reflect an infectious or inflammatory ileitis. No evidence of bowel obstruction.  These results were called by telephone at the time of interpretation on 12/01/2019 at 8:13 pm to provider Dr. SJoni Fears who verbally acknowledged these results.   Electronically Signed   By: TZerita BoersM.D.   On: 12/01/2019 20:14 Within last 24 hrs: No results found.  Assessment    Reducible incisional hernia. Smoker/tobacco abuse. Morbid obesity. Patient Active Problem List   Diagnosis Date Noted  . Aortic atherosclerosis ([facility] 10/27/2020  . HSV (herpes simplex virus) infection 02/16/2020  . History of non anemic vitamin B12 deficiency 10/02/2017  . IFG (impaired fasting glucose) 04/07/2017  . Benign hypertension 04/07/2017  . Depression 05/19/2016  . Recurrent boils 05/19/2016  . GERD (gastroesophageal reflux disease) 05/19/2016  . Hypothyroid   . Anxiety   . Nicotine dependence, cigarettes, uncomplicated   . Hyperlipidemia   . Morbid obesity ([place]     Plan    We discussed elective nature of hernia repair, I strongly emphasized the need to do 1 good repair and for her to be optimized as well as conceivable prior to this.  We discussed smoking cessation and weight loss.  We discussed social factors that seem to be limiting her ability to comply with necessary changes to optimize her health, not just for the immediate future in her hernia repair but for  long-term.  She describes utilizing nicotine as a crutch to help her with dealing with her daughter with attention deficit.  She became somewhat emotional at the challenges that I wrote for her.  Spent some additional time encouraging her that it was in her best interest to defer any kind of surgical repair until things were better optimized. We discussed emergencies and  emergency surgery, she has further definition of what made an emergency. But I told her this is not best attempted aside from her pursuit of improved health. Suggested we might be able to follow her up in 3 to 6 months.  She may need some assistance with weight loss, and may need some help with smoking cessation as well. Face-to-face time spent with the patient and accompanying care providers(if present) was 45 minutes, with more than 50% of the time spent counseling, educating, and coordinating care of the patient.      Ronny Bacon M.D., FACS 02/21/2021, 3:26 PM

## 2021-02-21 NOTE — Patient Instructions (Signed)
Work on quit smoking and lose weight so the surgery can take place. If you have any concerns or questions, please feel free to call our office.   Calorie Counting for Weight Loss Calories are units of energy. Your body needs a certain number of calories from food to keep going throughout the day. When you eat or drink more calories than your body needs, your body stores the extra calories mostly as fat. When you eat or drink fewer calories than your body needs, your body burns fat to get the energy it needs. Calorie counting means keeping track of how many calories you eat and drink each day. Calorie counting can be helpful if you need to lose weight. If you eat fewer calories than your body needs, you should lose weight. Ask your health care provider what a healthy weight is for you. For calorie counting to work, you will need to eat the right number of calories each day to lose a healthy amount of weight per week. A dietitian can help you figure out how many calories you need in a day and will suggest ways to reach your calorie goal.  A healthy amount of weight to lose each week is usually 1-2 lb (0.5-0.9 kg). This usually means that your daily calorie intake should be reduced by 500-750 calories.  Eating 1,200-1,500 calories a day can help most women lose weight.  Eating 1,500-1,800 calories a day can help most men lose weight. What do I need to know about calorie counting? Work with your health care provider or dietitian to determine how many calories you should get each day. To meet your daily calorie goal, you will need to:  Find out how many calories are in each food that you would like to eat. Try to do this before you eat.  Decide how much of the food you plan to eat.  Keep a food log. Do this by writing down what you ate and how many calories it had. To successfully lose weight, it is important to balance calorie counting with a healthy lifestyle that includes regular activity. Where  do I find calorie information? The number of calories in a food can be found on a Nutrition Facts label. If a food does not have a Nutrition Facts label, try to look up the calories online or ask your dietitian for help. Remember that calories are listed per serving. If you choose to have more than one serving of a food, you will have to multiply the calories per serving by the number of servings you plan to eat. For example, the label on a package of bread might say that a serving size is 1 slice and that there are 90 calories in a serving. If you eat 1 slice, you will have eaten 90 calories. If you eat 2 slices, you will have eaten 180 calories.   How do I keep a food log? After each time that you eat, record the following in your food log as soon as possible:  What you ate. Be sure to include toppings, sauces, and other extras on the food.  How much you ate. This can be measured in cups, ounces, or number of items.  How many calories were in each food and drink.  The total number of calories in the food you ate. Keep your food log near you, such as in a pocket-sized notebook or on an app or website on your mobile phone. Some programs will calculate calories for you and  show you how many calories you have left to meet your daily goal. What are some portion-control tips?  Know how many calories are in a serving. This will help you know how many servings you can have of a certain food.  Use a measuring cup to measure serving sizes. You could also try weighing out portions on a kitchen scale. With time, you will be able to estimate serving sizes for some foods.  Take time to put servings of different foods on your favorite plates or in your favorite bowls and cups so you know what a serving looks like.  Try not to eat straight from a food's packaging, such as from a bag or box. Eating straight from the package makes it hard to see how much you are eating and can lead to overeating. Put the  amount you would like to eat in a cup or on a plate to make sure you are eating the right portion.  Use smaller plates, glasses, and bowls for smaller portions and to prevent overeating.  Try not to multitask. For example, avoid watching TV or using your computer while eating. If it is time to eat, sit down at a table and enjoy your food. This will help you recognize when you are full. It will also help you be more mindful of what and how much you are eating. What are tips for following this plan? Reading food labels  Check the calorie count compared with the serving size. The serving size may be smaller than what you are used to eating.  Check the source of the calories. Try to choose foods that are high in protein, fiber, and vitamins, and low in saturated fat, trans fat, and sodium. Shopping  Read nutrition labels while you shop. This will help you make healthy decisions about which foods to buy.  Pay attention to nutrition labels for low-fat or fat-free foods. These foods sometimes have the same number of calories or more calories than the full-fat versions. They also often have added sugar, starch, or salt to make up for flavor that was removed with the fat.  Make a grocery list of lower-calorie foods and stick to it. Cooking  Try to cook your favorite foods in a healthier way. For example, try baking instead of frying.  Use low-fat dairy products. Meal planning  Use more fruits and vegetables. One-half of your plate should be fruits and vegetables.  Include lean proteins, such as chicken, [place], and fish. Lifestyle Each week, aim to do one of the following:  150 minutes of moderate exercise, such as walking.  75 minutes of vigorous exercise, such as running. General information  Know how many calories are in the foods you eat most often. This will help you calculate calorie counts faster.  Find a way of tracking calories that works for you. Get creative. Try different  apps or programs if writing down calories does not work for you. What foods should I eat?  Eat nutritious foods. It is better to have a nutritious, high-calorie food, such as an avocado, than a food with few nutrients, such as a bag of potato chips.  Use your calories on foods and drinks that will fill you up and will not leave you hungry soon after eating. ? Examples of foods that fill you up are nuts and nut butters, vegetables, lean proteins, and high-fiber foods such as whole grains. High-fiber foods are foods with more than 5 g of fiber per serving.  Pay  attention to calories in drinks. Low-calorie drinks include water and unsweetened drinks. The items listed above may not be a complete list of foods and beverages you can eat. Contact a dietitian for more information.   What foods should I limit? Limit foods or drinks that are not good sources of vitamins, minerals, or protein or that are high in unhealthy fats. These include:  Candy.  Other sweets.  Sodas, specialty coffee drinks, alcohol, and juice. The items listed above may not be a complete list of foods and beverages you should avoid. Contact a dietitian for more information. How do I count calories when eating out?  Pay attention to portions. Often, portions are much larger when eating out. Try these tips to keep portions smaller: ? Consider sharing a meal instead of getting your own. ? If you get your own meal, eat only half of it. Before you start eating, ask for a container and put half of your meal into it. ? When available, consider ordering smaller portions from the menu instead of full portions.  Pay attention to your food and drink choices. Knowing the way food is cooked and what is included with the meal can help you eat fewer calories. ? If calories are listed on the menu, choose the lower-calorie options. ? Choose dishes that include vegetables, fruits, whole grains, low-fat dairy products, and lean  proteins. ? Choose items that are boiled, broiled, grilled, or steamed. Avoid items that are buttered, battered, fried, or served with cream sauce. Items labeled as crispy are usually fried, unless stated otherwise. ? Choose water, low-fat milk, unsweetened iced tea, or other drinks without added sugar. If you want an alcoholic beverage, choose a lower-calorie option, such as a glass of wine or light beer. ? Ask for dressings, sauces, and syrups on the side. These are usually high in calories, so you should limit the amount you eat. ? If you want a salad, choose a garden salad and ask for grilled meats. Avoid extra toppings such as bacon, cheese, or fried items. Ask for the dressing on the side, or ask for olive oil and vinegar or lemon to use as dressing.  Estimate how many servings of a food you are given. Knowing serving sizes will help you be aware of how much food you are eating at restaurants. Where to find more information  Centers for Disease Control and Prevention: http://www.wolf.info/  U.S. Department of Agriculture: http://www.wilson-mendoza.org/ Summary  Calorie counting means keeping track of how many calories you eat and drink each day. If you eat fewer calories than your body needs, you should lose weight.  A healthy amount of weight to lose per week is usually 1-2 lb (0.5-0.9 kg). This usually means reducing your daily calorie intake by 500-750 calories.  The number of calories in a food can be found on a Nutrition Facts label. If a food does not have a Nutrition Facts label, try to look up the calories online or ask your dietitian for help.  Use smaller plates, glasses, and bowls for smaller portions and to prevent overeating.  Use your calories on foods and drinks that will fill you up and not leave you hungry shortly after a meal. This information is not intended to replace advice given to you by your health care provider. Make sure you discuss any questions you have with your health care  provider. Document Revised: 12/01/2019 Document Reviewed: 12/01/2019 Elsevier Patient Education  2021 [place].   Steps to Quit Smoking Smoking  tobacco is the leading cause of preventable death. It can affect almost every organ in the body. Smoking puts you and people around you at risk for many serious, long-lasting (chronic) diseases. Quitting smoking can be hard, but it is one of the best things that you can do for your health. It is never too late to quit. How do I get ready to quit? When you decide to quit smoking, make a plan to help you succeed. Before you quit:  Pick a date to quit. Set a date within the next 2 weeks to give you time to prepare.  Write down the reasons why you are quitting. Keep this list in places where you will see it often.  Tell your family, friends, and co-workers that you are quitting. Their support is important.  Talk with your doctor about the choices that may help you quit.  Find out if your health insurance will pay for these treatments.  Know the people, places, things, and activities that make you want to smoke (triggers). Avoid them. What first steps can I take to quit smoking?  Throw away all cigarettes at home, at work, and in your car.  Throw away the things that you use when you smoke, such as ashtrays and lighters.  Clean your car. Make sure to empty the ashtray.  Clean your home, including curtains and carpets. What can I do to help me quit smoking? Talk with your doctor about taking medicines and seeing a counselor at the same time. You are more likely to succeed when you do both.  If you are pregnant or breastfeeding, talk with your doctor about counseling or other ways to quit smoking. Do not take medicine to help you quit smoking unless your doctor tells you to do so. To quit smoking: Quit right away  Quit smoking totally, instead of slowly cutting back on how much you smoke over a period of time.  Go to counseling. You are  more likely to quit if you go to counseling sessions regularly. Take medicine You may take medicines to help you quit. Some medicines need a prescription, and some you can buy over-the-counter. Some medicines may contain a drug called nicotine to replace the nicotine in cigarettes. Medicines may:  Help you to stop having the desire to smoke (cravings).  Help to stop the problems that come when you stop smoking (withdrawal symptoms). Your doctor may ask you to use:  Nicotine patches, gum, or lozenges.  Nicotine inhalers or sprays.  Non-nicotine medicine that is taken by mouth. Find resources Find resources and other ways to help you quit smoking and remain smoke-free after you quit. These resources are most helpful when you use them often. They include:  Online chats with a Social worker.  Phone quitlines.  Printed Furniture conservator/restorer.  Support groups or group counseling.  Text messaging programs.  Mobile phone apps. Use apps on your mobile phone or tablet that can help you stick to your quit plan. There are many free apps for mobile phones and tablets as well as websites. Examples include Quit Guide from the [employer] and smokefree.gov   What things can I do to make it easier to quit?  Talk to your family and friends. Ask them to support and encourage you.  Call a phone quitline (1-800-QUIT-NOW), reach out to support groups, or work with a Social worker.  Ask people who smoke to not smoke around you.  Avoid places that make you want to smoke, such as: ? Bars. ?  Parties. ? Smoke-break areas at work.  Spend time with people who do not smoke.  Lower the stress in your life. Stress can make you want to smoke. Try these things to help your stress: ? Getting regular exercise. ? Doing deep-breathing exercises. ? Doing yoga. ? Meditating. ? Doing a body scan. To do this, close your eyes, focus on one area of your body at a time from head to toe. Notice which parts of your body are tense.  Try to relax the muscles in those areas.   How will I feel when I quit smoking? Day 1 to 3 weeks Within the first 24 hours, you may start to have some problems that come from quitting tobacco. These problems are very bad 2-3 days after you quit, but they do not often last for more than 2-3 weeks. You may get these symptoms:  Mood swings.  Feeling restless, nervous, angry, or annoyed.  Trouble concentrating.  Dizziness.  Strong desire for high-sugar foods and nicotine.  Weight gain.  Trouble pooping (constipation).  Feeling like you may vomit (nausea).  Coughing or a sore throat.  Changes in how the medicines that you take for other issues work in your body.  Depression.  Trouble sleeping (insomnia). Week 3 and afterward After the first 2-3 weeks of quitting, you may start to notice more positive results, such as:  Better sense of smell and taste.  Less coughing and sore throat.  Slower heart rate.  Lower blood pressure.  Clearer skin.  Better breathing.  Fewer sick days. Quitting smoking can be hard. Do not give up if you fail the first time. Some people need to try a few times before they succeed. Do your best to stick to your quit plan, and talk with your doctor if you have any questions or concerns. Summary  Smoking tobacco is the leading cause of preventable death. Quitting smoking can be hard, but it is one of the best things that you can do for your health.  When you decide to quit smoking, make a plan to help you succeed.  Quit smoking right away, not slowly over a period of time.  When you start quitting, seek help from your doctor, family, or friends. This information is not intended to replace advice given to you by your health care provider. Make sure you discuss any questions you have with your health care provider. Document Revised: 07/15/2019 Document Reviewed: 01/08/2019 Elsevier Patient Education  2021 [place] of Quitting Smoking Quitting smoking is a physical and mental challenge. You will face cravings, withdrawal symptoms, and temptation. Before quitting, work with your health care provider to make a plan that can help you manage quitting. Preparation can help you quit and keep you from giving in. How to manage lifestyle changes Managing stress Stress can make you want to smoke, and wanting to smoke may cause stress. It is important to find ways to manage your stress. You might try some of the following:  Practice relaxation techniques. ? Breathe slowly and deeply, in through your nose and out through your mouth. ? Listen to music. ? Soak in a bath or take a shower. ? Imagine a peaceful place or vacation.  Get some support. ? Talk with family or friends about your stress. ? Join a support group. ? Talk with a counselor or therapist.  Get some physical activity. ? Go for a walk, run, or bike ride. ? Play a favorite  sport. ? Practice yoga.   Medicines Talk with your health care provider about medicines that might help you deal with cravings and make quitting easier for you. Relationships Social situations can be difficult when you are quitting smoking. To manage this, you can:  Avoid parties and other social situations where people might be smoking.  Avoid alcohol.  Leave right away if you have the urge to smoke.  Explain to your family and friends that you are quitting smoking. Ask for support and let them know you might be a bit grumpy.  Plan activities where smoking is not an option. General instructions Be aware that many people gain weight after they quit smoking. However, not everyone does. To keep from gaining weight, have a plan in place before you quit and stick to the plan after you quit. Your plan should include:  Having healthy snacks. When you have a craving, it may help to: ? Eat popcorn, carrots, celery, or other cut vegetables. ? Chew sugar-free  gum.  Changing how you eat. ? Eat small portion sizes at meals. ? Eat 4-6 small meals throughout the day instead of 1-2 large meals a day. ? Be mindful when you eat. Do not watch television or do other things that might distract you as you eat.  Exercising regularly. ? Make time to exercise each day. If you do not have time for a long workout, do short bouts of exercise for 5-10 minutes several times a day. ? Do some form of strengthening exercise, such as weight lifting. ? Do some exercise that gets your heart beating and causes you to breathe deeply, such as walking fast, running, swimming, or biking. This is very important.  Drinking plenty of water or other low-calorie or no-calorie drinks. Drink 6-8 glasses of water daily.   How to recognize withdrawal symptoms Your body and mind may experience discomfort as you try to get used to not having nicotine in your system. These effects are called withdrawal symptoms. They may include:  Feeling hungrier than normal.  Having trouble concentrating.  Feeling irritable or restless.  Having trouble sleeping.  Feeling depressed.  Craving a cigarette. To manage withdrawal symptoms:  Avoid places, people, and activities that trigger your cravings.  Remember why you want to quit.  Get plenty of sleep.  Avoid coffee and other caffeinated drinks. These may worsen some of your symptoms. These symptoms may surprise you. But be assured that they are normal to have when quitting smoking. How to manage cravings Come up with a plan for how to deal with your cravings. The plan should include the following:  A definition of the specific situation you want to deal with.  An alternative action you will take.  A clear idea for how this action will help.  The name of someone who might help you with this. Cravings usually last for 5-10 minutes. Consider taking the following actions to help you with your plan to deal with cravings:  Keep your  mouth busy. ? Chew sugar-free gum. ? Suck on hard candies or a straw. ? Brush your teeth.  Keep your hands and body busy. ? Change to a different activity right away. ? Squeeze or play with a ball. ? Do an activity or a hobby, such as making bead jewelry, practicing needlepoint, or working with wood. ? Mix up your normal routine. ? Take a short exercise break. Go for a quick walk or run up and down stairs.  Focus on doing something kind  or helpful for someone else.  Call a friend or family member to talk during a craving.  Join a support group.  Contact a quitline. Where to find support To get help or find a support group:  Call the [facility]'s Smoking Quitline: 1-800-QUIT NOW (747)530-4561)  Visit the website of the Substance Abuse and [place]: ktimeonline.com  Text QUIT to SmokefreeTXT: 245809 Where to find more information Visit these websites to find more information on quitting smoking:  [place]: www.smokefree.gov  American Lung Association: www.lung.org  American Cancer Society: www.cancer.org  Centers for Disease Control and Prevention: http://www.wolf.info/  American Heart Association: www.heart.org Contact a health care provider if:  You want to change your plan for quitting.  The medicines you are taking are not helping.  Your eating feels out of control or you cannot sleep. Get help right away if:  You feel depressed or become very anxious. Summary  Quitting smoking is a physical and mental challenge. You will face cravings, withdrawal symptoms, and temptation to smoke again. Preparation can help you as you go through these challenges.  Try different techniques to manage stress, handle social situations, and prevent weight gain.  You can deal with cravings by keeping your mouth busy (such as by chewing gum), keeping your hands and body busy, calling family or friends, or contacting a quitline for people  who want to quit smoking.  You can deal with withdrawal symptoms by avoiding places where people smoke, getting plenty of rest, and avoiding drinks with caffeine. This information is not intended to replace advice given to you by your health care provider. Make sure you discuss any questions you have with your health care provider. Document Revised: 08/09/2019 Document Reviewed: 08/09/2019 Elsevier Patient Education  2021 [place].     Inguinal Hernia, Adult An inguinal hernia is when fat or your intestines push through a weak spot in a muscle where your leg meets your lower belly (groin). This causes a bulge. This kind of hernia could also be:  In your scrotum, if you are female.  In folds of skin around your vagina, if you are female. There are three types of inguinal hernias:  Hernias that can be pushed back into the belly (are reducible). This type rarely causes pain.  Hernias that cannot be pushed back into the belly (are incarcerated).  Hernias that cannot be pushed back into the belly and lose their blood supply (are strangulated). This type needs emergency surgery. What are the causes? This condition is caused by having a weak spot in the muscles or tissues in your groin. This develops over time. The hernia may poke through the weak spot when you strain your lower belly muscles all of a sudden, such as when you:  Lift a heavy object.  Strain to poop (have a bowel movement). Trouble pooping (constipation) can lead to straining.  Cough. What increases the risk? This condition is more likely to develop in:  Males.  Pregnant females.  People who: ? Are overweight. ? Work in jobs that require long periods of standing or heavy lifting. ? Have had an inguinal hernia before. ? Smoke or have lung disease. These factors can lead to long-term (chronic) coughing. What are the signs or symptoms? Symptoms may depend on the size of the hernia. Often, a small hernia has no  symptoms. Symptoms of a larger hernia may include:  A bulge in the groin area. This is easier to see when standing. You  might not be able to see it when you are lying down.  Pain or burning in the groin. This may get worse when you lift, strain, or cough.  A dull ache or a feeling of pressure in the groin.  An abnormal bulge in the scrotum, in males. Symptoms of a strangulated inguinal hernia may include:  A bulge in your groin that is very painful and tender to the touch.  A bulge that turns red or purple.  Fever, feeling like you may vomit (nausea), and vomiting.  Not being able to poop or to pass gas. How is this treated? Treatment depends on the size of your hernia and whether you have symptoms. If you do not have symptoms, your doctor may have you watch your hernia carefully and have you come in for follow-up visits. If your hernia is large or if you have symptoms, you may need surgery to repair the hernia. Follow these instructions at home: Lifestyle  Avoid lifting heavy objects.  Avoid standing for long amounts of time.  Do not smoke or use any products that contain nicotine or tobacco. If you need help quitting, ask your doctor.  Stay at a healthy weight. Prevent trouble pooping You may need to take these actions to prevent or treat trouble pooping:  Drink enough fluid to keep your pee (urine) pale yellow.  Take over-the-counter or prescription medicines.  Eat foods that are high in fiber. These include beans, whole grains, and fresh fruits and vegetables.  Limit foods that are high in fat and sugar. These include fried or sweet foods. General instructions  You may try to push your hernia back in place by very gently pressing on it when you are lying down. Do not try to push the bulge back in if it will not go in easily.  Watch your hernia for any changes in shape, size, or color. Tell your doctor if you see any changes.  Take over-the-counter and prescription  medicines only as told by your doctor.  Keep all follow-up visits. Contact a doctor if:  You have a fever or chills.  You have new symptoms.  Your symptoms get worse. Get help right away if:  You have pain in your groin that gets worse all of a sudden.  You have a bulge in your groin that: ? Gets bigger all of a sudden, and it does not get smaller after that. ? Turns red or purple. ? Is painful when you touch it.  You are a female, and you have: ? Sudden pain in your scrotum. ? A sudden change in the size of your scrotum.  You cannot push the hernia back in place by very gently pressing on it when you are lying down.  You feel like you may vomit, and that feeling does not go away.  You keep vomiting.  You have a fast heartbeat.  You cannot poop or pass gas. These symptoms may be an emergency. Get help right away. Call your local emergency services (911 in the [place]  Do not wait to see if the symptoms will go away.  Do not drive yourself to the hospital. Summary  An inguinal hernia is when fat or your intestines push through a weak spot in a muscle where your leg meets your lower belly (groin). This causes a bulge.  If you do not have symptoms, you may not need treatment. If you have symptoms or a large hernia, you may need surgery.  Avoid lifting heavy objects.  Also, avoid standing for long amounts of time.  Do not try to push the bulge back in if it will not go in easily. This information is not intended to replace advice given to you by your health care provider. Make sure you discuss any questions you have with your health care provider. Document Revised: 06/19/2020 Document Reviewed: 06/19/2020 Elsevier Patient Education  2021 [employer].

## 2021-03-03 ENCOUNTER — Other Ambulatory Visit: Payer: Self-pay | Admitting: Nurse Practitioner

## 2021-03-03 NOTE — Telephone Encounter (Signed)
Requested Prescriptions  Pending Prescriptions Disp Refills  . omeprazole (PRILOSEC) 40 MG capsule [Pharmacy Med Name: Omeprazole 40 MG Oral Capsule Delayed Release] 90 capsule 0    Sig: Take 1 capsule by mouth once daily     Gastroenterology: Proton Pump Inhibitors Passed - 03/03/2021 12:50 PM      Passed - Valid encounter within last 12 months    Recent Outpatient Visits          2 weeks ago Incisional hernia, without obstruction or gangrene   [facility] Jon Billings, NP   3 months ago COVID-19   [facility], Megan P, DO   4 months ago Current 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 episode ([place])   [place], Jolene T, NP   10 months ago Current 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 episode [facility])   [place], [place], [place]   11 months ago Right arm pain   [place], Lilia Argue, [place]      Future Appointments            In 1 month Jon Billings, NP [facility], [place]

## 2021-03-31 ENCOUNTER — Other Ambulatory Visit: Payer: Self-pay | Admitting: Nurse Practitioner

## 2021-04-17 ENCOUNTER — Other Ambulatory Visit: Payer: Self-pay

## 2021-04-17 ENCOUNTER — Ambulatory Visit (INDEPENDENT_AMBULATORY_CARE_PROVIDER_SITE_OTHER): Payer: 59 | Admitting: Nurse Practitioner

## 2021-04-17 ENCOUNTER — Encounter: Payer: Self-pay | Admitting: Nurse Practitioner

## 2021-04-17 VITALS — BP 116/79 | HR 74 | Temp 98.0°F | Wt 286.0 lb

## 2021-04-17 DIAGNOSIS — F17210 Nicotine dependence, cigarettes, uncomplicated: Secondary | ICD-10-CM | POA: Diagnosis not present

## 2021-04-17 DIAGNOSIS — N393 Stress incontinence (female) (male): Secondary | ICD-10-CM

## 2021-04-17 DIAGNOSIS — F320 Major depressive disorder, single episode, mild: Secondary | ICD-10-CM

## 2021-04-17 MED ORDER — OXYBUTYNIN CHLORIDE ER 10 MG PO TB24: 10.0000 mg | ORAL_TABLET | Freq: Every day | ORAL | 1 refills | Status: DC

## 2021-04-17 MED ORDER — DICLOFENAC SODIUM 1 % EX GEL: 2.0000 g | Freq: Four times a day (QID) | CUTANEOUS | 2 refills | Status: DC

## 2021-04-17 MED ORDER — BUPROPION HCL ER (XL) 300 MG PO TB24: 300.0000 mg | ORAL_TABLET | Freq: Every day | ORAL | 1 refills | Status: DC

## 2021-04-17 NOTE — Assessment & Plan Note (Signed)
Chronic.  Improved.  Will increase dose of Wellbutrin due to already seeing benefits.  Discussed that this can continue to help patient quit smoking. Follow up in 3 months.

## 2021-04-17 NOTE — Assessment & Plan Note (Signed)
Will increase Wellbutrin to 370m daily. Recommend decreasing by 1 cigarette daily to continue to quit smoking. Patient is not interested in doing the patch.  Follow up in 3 months.

## 2021-04-17 NOTE — Progress Notes (Signed)
BP 116/79   Pulse 74   Temp 98 F (36.7 C)   Wt 286 lb (129.7 kg)   SpO2 96%   BMI 44.79 kg/m    Subjective:    Patient ID: Lynn French, female    DOB: 17-Feb-1971, 50 y.o.   MRN: [id number]  HPI: Lynn French is a 50 y.o. female  Chief Complaint  Patient presents with   overactive bladder   Medication Refill    Voltaren Gel   INCONTINENCE  Patient states she thinks it has gotten a little better with the medication.  However, it has helped a ton.  She is wondering if she can up the dose to see if that will make a difference.  Patient states the Wellbutrin has given her more energy.  She has more desire to do things around the house. She is smoking a little less her day and has lost about 6lbs.  Patient did see the General Surgeon about the hernia but they are waiting for her to quit smoking and lose some weight before performing surgery. Denies SI.    Denies HA, CP, SOB, dizziness, palpitations, visual changes, and lower extremity swelling.   Relevant past medical, surgical, family and social history reviewed and updated as indicated. Interim medical history since our last visit reviewed. Allergies and medications reviewed and updated.  Review of Systems  Eyes:  Negative for visual disturbance.  Respiratory:  Negative for cough, chest tightness and shortness of breath.   Cardiovascular:  Negative for chest pain, palpitations and leg swelling.  Neurological:  Negative for dizziness and headaches.  Psychiatric/Behavioral:  Negative for dysphoric mood and suicidal ideas.    Per HPI unless specifically indicated above     Objective:    BP 116/79   Pulse 74   Temp 98 F (36.7 C)   Wt 286 lb (129.7 kg)   SpO2 96%   BMI 44.79 kg/m   Wt Readings from Last 3 Encounters:  04/17/21 286 lb (129.7 kg)  02/21/21 295 lb 6.4 oz (134 kg)  02/14/21 293 lb 9.6 oz (133.2 kg)    Physical Exam Vitals and nursing note reviewed.  Constitutional:      General: She is not in  acute distress.    Appearance: Normal appearance. She is normal weight. She is not ill-appearing, toxic-appearing or diaphoretic.  HENT:     Head: Normocephalic.     Right Ear: External ear normal.     Left Ear: External ear normal.     Nose: Nose normal.     Mouth/Throat:     Mouth: Mucous membranes are moist.     Pharynx: Oropharynx is clear.  Eyes:     General:        Right eye: No discharge.        Left eye: No discharge.     Extraocular Movements: Extraocular movements intact.     Conjunctiva/sclera: Conjunctivae normal.     Pupils: Pupils are equal, round, and reactive to light.  Cardiovascular:     Rate and Rhythm: Normal rate and regular rhythm.     Heart sounds: No murmur heard. Pulmonary:     Effort: Pulmonary effort is normal. No respiratory distress.     Breath sounds: Normal breath sounds. No wheezing or rales.  Musculoskeletal:     Cervical back: Normal range of motion and neck supple.  Skin:    General: Skin is warm and dry.     Capillary Refill: Capillary refill takes  less than 2 seconds.  Neurological:     General: No focal deficit present.     Mental Status: She is alert and oriented to person, place, and time. Mental status is at baseline.  Psychiatric:        Mood and Affect: Mood normal.        Behavior: Behavior normal.        Thought Content: Thought content normal.        Judgment: Judgment normal.    Results for orders placed or performed in visit on 11/13/20  Novel Coronavirus, NAA ([facility])   Specimen: Nasopharyngeal(NP) swabs in vial transport medium   Nasopharynge  Result Value Ref Range   SARS-CoV-2, NAA Detected (A) Not Detected  SARS-COV-2, NAA 2 DAY TAT   Nasopharynge  Result Value Ref Range   SARS-CoV-2, NAA 2 DAY TAT Performed       Assessment & Plan:   Problem List Items Addressed This Visit       Other   Nicotine dependence, cigarettes, uncomplicated    Will increase Wellbutrin to 320m daily. Recommend decreasing by 1  cigarette daily to continue to quit smoking. Patient is not interested in doing the patch.  Follow up in 3 months.       Depression    Chronic.  Improved.  Will increase dose of Wellbutrin due to already seeing benefits.  Discussed that this can continue to help patient quit smoking. Follow up in 3 months.        Relevant Medications   buPROPion (WELLBUTRIN XL) 300 MG 24 hr tablet   Other Visit Diagnoses     Stress incontinence    -  Primary   Increased Oxybutinin to 1103mdaily.  Follow up in 3 months to see how medication is working. Can refer to Urology if symptoms are not improving.    Relevant Medications   oxybutynin (DITROPAN XL) 10 MG 24 hr tablet        Follow up plan: Return in about 3 months (around 07/18/2021) for HTN, HLD, DM2 FU.

## 2021-05-26 ENCOUNTER — Other Ambulatory Visit: Payer: Self-pay | Admitting: Nurse Practitioner

## 2021-05-26 NOTE — Telephone Encounter (Signed)
Requested Prescriptions  Pending Prescriptions Disp Refills  . omeprazole (PRILOSEC) 40 MG capsule [Pharmacy Med Name: Omeprazole 40 MG Oral Capsule Delayed Release] 90 capsule 0    Sig: Take 1 capsule by mouth once daily     Gastroenterology: Proton Pump Inhibitors Passed - 05/26/2021  4:58 PM      Passed - Valid encounter within last 12 months    Recent Outpatient Visits          1 month ago Stress incontinence   [facility] Jon Billings, NP   3 months ago Incisional hernia, without obstruction or gangrene   [facility] Jon Billings, NP   6 months ago COVID-19   [facility], Megan P, DO   6 months ago Current 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 episode ([place])   [place], Jolene T, NP   1 year ago Current 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 episode [facility])   [place], Rachel Elizabeth, PA-C      Future Appointments            In 1 month Jon Billings, NP [facility], [place]

## 2021-07-17 NOTE — Progress Notes (Addendum)
BP 133/75   Pulse 97   Temp 97.7 F (36.5 C) (Oral)   Ht 5' 7.32" (1.71 m)   Wt 278 lb 9.6 oz (126.4 kg)   SpO2 97%   BMI 43.22 kg/m    Subjective:    Patient ID: Lynn French, female    DOB: [birth year], 50 y.o.   MRN: [id number]  HPI: Lynn French is a 50 y.o. female  Chief Complaint  Patient presents with   Depression   Diabetes   Hyperlipidemia   Hypertension   HYPERTENSION / HYPERLIPIDEMIA Satisfied with current treatment? yes Duration of hypertension: years BP monitoring frequency: not checking BP range:  BP medication side effects: no Past BP meds: HCTZ- patient states she only takes it TID Duration of hyperlipidemia: years Cholesterol medication side effects: no Cholesterol supplements: none Past cholesterol medications: none Medication compliance: good compliance Aspirin: no Recent stressors: no Recurrent headaches: no Visual changes: no Palpitations: no Dyspnea: no Chest pain: no Lower extremity edema: no Dizzy/lightheaded: no  DEPRESSION/ANXIETY Patient states she feels like she is doing well.  Patient denies concerns with her depression and anxiety.   [place] Office Visit from 07/18/2021 in [place]  PHQ-9 Total Score 7      GAD 7 : Generalized Anxiety Score 07/18/2021 02/07/2020 11/02/2019 06/23/2019  Nervous, Anxious, on Edge 0 1 1 0  Control/stop worrying 1 0 0 0  Worry too much - different things 1 1 1 1   Trouble relaxing 1 1 0 0  Restless 2 2 2 1   Easily annoyed or irritable 2 1 2 1   Afraid - awful might happen 1 0 1 0  Total GAD 7 Score 8 6 7 3   Anxiety Difficulty Somewhat difficult Somewhat difficult Somewhat difficult Somewhat difficult     HYPOTHYROIDISM Thyroid control status:controlled Satisfied with current treatment? no Medication side effects: no Medication compliance: excellent compliance Etiology of hypothyroidism:  Recent dose adjustment:no Fatigue: yes Cold intolerance: no Heat  intolerance: no Weight gain: no Weight loss: no Constipation: no Diarrhea/loose stools: no Palpitations: no Lower extremity edema: no Anxiety/depressed mood: no  Relevant past medical, surgical, family and social history reviewed and updated as indicated. Interim medical history since our last visit reviewed. Allergies and medications reviewed and updated.  Review of Systems  Constitutional:  Positive for fatigue. Negative for fever and unexpected weight change.  Eyes:  Negative for visual disturbance.  Respiratory:  Negative for cough, chest tightness and shortness of breath.   Cardiovascular:  Negative for chest pain, palpitations and leg swelling.  Endocrine: Negative for cold intolerance.  Neurological:  Negative for dizziness and headaches.  Psychiatric/Behavioral:  Negative for dysphoric mood and suicidal ideas. The patient is not nervous/anxious.    Per HPI unless specifically indicated above     Objective:    BP 133/75   Pulse 97   Temp 97.7 F (36.5 C) (Oral)   Ht 5' 7.32" (1.71 m)   Wt 278 lb 9.6 oz (126.4 kg)   SpO2 97%   BMI 43.22 kg/m   Wt Readings from Last 3 Encounters:  07/18/21 278 lb 9.6 oz (126.4 kg)  04/17/21 286 lb (129.7 kg)  02/21/21 295 lb 6.4 oz (134 kg)    Physical Exam Vitals and nursing note reviewed.  Constitutional:      General: She is not in acute distress.    Appearance: Normal appearance. She is obese. She is not ill-appearing, toxic-appearing or diaphoretic.  HENT:  Head: Normocephalic.     Right Ear: External ear normal.     Left Ear: External ear normal.     Nose: Nose normal.     Mouth/Throat:     Mouth: Mucous membranes are moist.     Pharynx: Oropharynx is clear.  Eyes:     General:        Right eye: No discharge.        Left eye: No discharge.     Extraocular Movements: Extraocular movements intact.     Conjunctiva/sclera: Conjunctivae normal.     Pupils: Pupils are equal, round, and reactive to light.   Cardiovascular:     Rate and Rhythm: Normal rate and regular rhythm.     Heart sounds: No murmur heard. Pulmonary:     Effort: Pulmonary effort is normal. No respiratory distress.     Breath sounds: Normal breath sounds. No wheezing or rales.  Musculoskeletal:     Cervical back: Normal range of motion and neck supple.  Skin:    General: Skin is warm and dry.     Capillary Refill: Capillary refill takes less than 2 seconds.  Neurological:     General: No focal deficit present.     Mental Status: She is alert and oriented to person, place, and time. Mental status is at baseline.  Psychiatric:        Mood and Affect: Mood normal.        Behavior: Behavior normal.        Thought Content: Thought content normal.        Judgment: Judgment normal.    Results for orders placed or performed in visit on 11/13/20  Novel Coronavirus, NAA ([facility])   Specimen: Nasopharyngeal(NP) swabs in vial transport medium   Nasopharynge  Result Value Ref Range   SARS-CoV-2, NAA Detected (A) Not Detected  SARS-COV-2, NAA 2 DAY TAT   Nasopharynge  Result Value Ref Range   SARS-CoV-2, NAA 2 DAY TAT Performed       Assessment & Plan:   Problem List Items Addressed This Visit       Cardiovascular and Mediastinum   Benign hypertension    Chronic.  Controlled.  Will stop HCTZ at visit today.  Patient has only been taking in TID. Will see if her urinary symptoms improve with stopping the medication.  If patient's blood pressure increases will add a different medication back to regimen such as valsartan.  Labs ordered today.  Return to clinic in 3 months for reevaluation.  Call sooner if concerns arise.        Relevant Orders   Comp Met (CMET)   Aortic atherosclerosis ([place])    Labs ordered today. Will make recommendations based on lab results.         Endocrine   Hypothyroid    Chronic.  Controlled.  Continue with current medication regimen.  Labs ordered today.  Will make recommendations based  on lab results.  Return to clinic in 3 months for reevaluation.  Call sooner if concerns arise.        Relevant Orders   TSH   T4, free   IFG (impaired fasting glucose)   Relevant Orders   HgB A1c     Other   Anxiety    Chronic.  Controlled.  Continue with current medication regimen on Wellbutrin 386m daily.  Labs ordered today.  Return to clinic in 3 months for reevaluation.  Call sooner if concerns arise.  Hyperlipidemia    Labs ordered today. Will make recommendations based on lab results.       Relevant Orders   Lipid Profile   Depression - Primary    Chronic.  Controlled.  Continue with current medication regimen on Wellbutrin 334m daily.  Labs ordered today.  Return to clinic in 3 months for reevaluation.  Call sooner if concerns arise.        History of non anemic vitamin B12 deficiency    Labs ordered today.  Will make recommendations based on lab results.       Relevant Orders   B12   Other Visit Diagnoses     Encounter for screening mammogram for malignant neoplasm of breast       Relevant Orders   MM 3D SCREEN BREAST BILATERAL        Follow up plan: Return in about 3 months (around 10/17/2021) for Physical and Fasting labs.

## 2021-07-18 ENCOUNTER — Other Ambulatory Visit: Payer: Self-pay

## 2021-07-18 ENCOUNTER — Encounter: Payer: Self-pay | Admitting: Nurse Practitioner

## 2021-07-18 ENCOUNTER — Ambulatory Visit (INDEPENDENT_AMBULATORY_CARE_PROVIDER_SITE_OTHER): Payer: 59 | Admitting: Nurse Practitioner

## 2021-07-18 VITALS — BP 133/75 | HR 97 | Temp 97.7°F | Ht 67.32 in | Wt 278.6 lb

## 2021-07-18 DIAGNOSIS — I700 Atherosclerosis of aorta: Secondary | ICD-10-CM

## 2021-07-18 DIAGNOSIS — Z8639 Personal history of other endocrine, nutritional and metabolic disease: Secondary | ICD-10-CM

## 2021-07-18 DIAGNOSIS — Z1231 Encounter for screening mammogram for malignant neoplasm of breast: Secondary | ICD-10-CM

## 2021-07-18 DIAGNOSIS — F320 Major depressive disorder, single episode, mild: Secondary | ICD-10-CM

## 2021-07-18 DIAGNOSIS — R7301 Impaired fasting glucose: Secondary | ICD-10-CM | POA: Diagnosis not present

## 2021-07-18 DIAGNOSIS — E038 Other specified hypothyroidism: Secondary | ICD-10-CM

## 2021-07-18 DIAGNOSIS — F419 Anxiety disorder, unspecified: Secondary | ICD-10-CM

## 2021-07-18 DIAGNOSIS — I10 Essential (primary) hypertension: Secondary | ICD-10-CM | POA: Diagnosis not present

## 2021-07-18 DIAGNOSIS — E782 Mixed hyperlipidemia: Secondary | ICD-10-CM

## 2021-07-18 NOTE — Assessment & Plan Note (Signed)
Chronic.  Controlled.  Continue with current medication regimen on Wellbutrin 38m daily.  Labs ordered today.  Return to clinic in 3 months for reevaluation.  Call sooner if concerns arise.

## 2021-07-18 NOTE — Assessment & Plan Note (Signed)
Labs ordered today. Will make recommendations based on lab results.

## 2021-07-18 NOTE — Assessment & Plan Note (Signed)
Chronic.  Controlled.  Will stop HCTZ at visit today.  Patient has only been taking in TID. Will see if her urinary symptoms improve with stopping the medication.  If patient's blood pressure increases will add a different medication back to regimen such as valsartan.  Labs ordered today.  Return to clinic in 3 months for reevaluation.  Call sooner if concerns arise.

## 2021-07-18 NOTE — Assessment & Plan Note (Signed)
Labs ordered today.  Will make recommendations based on lab results.

## 2021-07-18 NOTE — Assessment & Plan Note (Signed)
Chronic.  Controlled.  Continue with current medication regimen.  Labs ordered today.  Will make recommendations based on lab results.  Return to clinic in 3 months for reevaluation.  Call sooner if concerns arise.

## 2021-07-18 NOTE — Assessment & Plan Note (Signed)
Chronic.  Controlled.  Continue with current medication regimen on Wellbutrin 362m daily.  Labs ordered today.  Return to clinic in 3 months for reevaluation.  Call sooner if concerns arise.

## 2021-07-19 LAB — COMPREHENSIVE METABOLIC PANEL
ALT: 10 IU/L (ref 0–32)
AST: 18 IU/L (ref 0–40)
Albumin/Globulin Ratio: 1.2 (ref 1.2–2.2)
Albumin: 3.7 g/dL — ABNORMAL LOW (ref 3.8–4.8)
Alkaline Phosphatase: 126 IU/L — ABNORMAL HIGH (ref 44–121)
BUN/Creatinine Ratio: 10 (ref 9–23)
BUN: 8 mg/dL (ref 6–24)
Bilirubin Total: <0.2 mg/dL (ref 0.0–1.2)
CO2: 25 mmol/L (ref 20–29)
Calcium: 9.3 mg/dL (ref 8.7–10.2)
Chloride: 100 mmol/L (ref 96–106)
Creatinine, Ser: 0.84 mg/dL (ref 0.57–1.00)
Globulin, Total: 3 g/dL (ref 1.5–4.5)
Glucose: 102 mg/dL — ABNORMAL HIGH (ref 65–99)
Potassium: 5.3 mmol/L — ABNORMAL HIGH (ref 3.5–5.2)
Sodium: 138 mmol/L (ref 134–144)
Total Protein: 6.7 g/dL (ref 6.0–8.5)
eGFR: 86 mL/min/{1.73_m2} (ref >59)

## 2021-07-19 LAB — LIPID PANEL
Chol/HDL Ratio: 5.5 ratio — ABNORMAL HIGH (ref 0.0–4.4)
Cholesterol, Total: 210 mg/dL — ABNORMAL HIGH (ref 100–199)
HDL: 38 mg/dL — ABNORMAL LOW (ref >39)
LDL Chol Calc (NIH): 137 mg/dL — ABNORMAL HIGH (ref 0–99)
Triglycerides: 196 mg/dL — ABNORMAL HIGH (ref 0–149)
VLDL Cholesterol Cal: 35 mg/dL (ref 5–40)

## 2021-07-19 LAB — T4, FREE: Free T4: 1.22 ng/dL (ref 0.82–1.77)

## 2021-07-19 LAB — TSH: TSH: 2.49 u[IU]/mL (ref 0.450–4.500)

## 2021-07-19 LAB — HEMOGLOBIN A1C
Est. average glucose Bld gHb Est-mCnc: 131 mg/dL
Hgb A1c MFr Bld: 6.2 % — ABNORMAL HIGH (ref 4.8–5.6)

## 2021-07-19 LAB — VITAMIN B12: Vitamin B-12: >2000 pg/mL — ABNORMAL HIGH (ref 232–1245)

## 2021-07-19 NOTE — Progress Notes (Signed)
Please let patient know that her lab work shows that her cholesterol is elevated.  I calculated her Cardiac risk score which helps [place] predict whether the patient is at higher risk of a stroke or heart attack over the next 10 years.  Patient is at higher risk.  I recommend she start crestor 39m daily. The goal will be to increase it to 223mas long as she tolerates the medication okay.  A1c remains well controlled at 6.2.  Keep up the good work. Thyroid and B12 labs look good. If patient agrees to the medication change I will send it in to the pharmacy for her.

## 2021-07-22 MED ORDER — ROSUVASTATIN CALCIUM 5 MG PO TABS: 5.0000 mg | ORAL_TABLET | Freq: Every day | ORAL | 1 refills | Status: DC

## 2021-07-22 NOTE — Progress Notes (Signed)
Medication sent to the pharmacy.

## 2021-07-22 NOTE — Addendum Note (Signed)
Addended by: Jon Billings on: 07/22/2021 03:04 PM   Modules accepted: Orders

## 2021-08-05 ENCOUNTER — Other Ambulatory Visit: Payer: Self-pay | Admitting: Nurse Practitioner

## 2021-08-25 ENCOUNTER — Other Ambulatory Visit: Payer: Self-pay | Admitting: Nurse Practitioner

## 2021-08-26 NOTE — Telephone Encounter (Signed)
Requested Prescriptions  Pending Prescriptions Disp Refills  . omeprazole (PRILOSEC) 40 MG capsule [Pharmacy Med Name: Omeprazole 40 MG Oral Capsule Delayed Release] 90 capsule 0    Sig: Take 1 capsule by mouth once daily     Gastroenterology: Proton Pump Inhibitors Passed - 08/25/2021 11:08 AM      Passed - Valid encounter within last 12 months    Recent Outpatient Visits          1 month ago Current 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 episode ([place])   [place], Karen, NP   4 months ago Stress incontinence   [facility] Jon Billings, NP   6 months ago Incisional hernia, without obstruction or gangrene   [facility] Jon Billings, NP   9 months ago COVID-19   [facility], Megan P, DO   9 months ago Current 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 episode ([place])   [place], Jolene T, NP      Future Appointments            In 1 month Jon Billings, NP [facility], [place]

## 2021-09-01 ENCOUNTER — Other Ambulatory Visit: Payer: Self-pay | Admitting: Nurse Practitioner

## 2021-09-01 NOTE — Telephone Encounter (Signed)
Requested medication (s) are due for refill today: yes  Requested medication (s) are on the active medication list: yes  Last refill:  02/14/21 #20  Future visit scheduled: yes  Notes to clinic:  med not delegated to NT to RF   Requested Prescriptions  Pending Prescriptions Disp Refills   ondansetron (ZOFRAN-ODT) 4 MG disintegrating tablet [Pharmacy Med Name: Ondansetron 4 MG Oral Tablet Disintegrating] 18 tablet 0    Sig: DISSOLVE 1 TABLET IN MOUTH EVERY 8 HOURS AS NEEDED FOR NAUSEA FOR VOMITING     Not Delegated - Gastroenterology: Antiemetics Failed - 09/01/2021  1:38 PM      Failed - This refill cannot be delegated      Passed - Valid encounter within last 6 months    Recent Outpatient Visits           1 month ago Current 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 episode ([place])   [place], Karen, NP   4 months ago Stress incontinence   [facility] Jon Billings, NP   6 months ago Incisional hernia, without obstruction or gangrene   [facility] Jon Billings, NP   9 months ago COVID-19   [facility], Megan P, DO   10 months ago Current 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 episode ([place])   [place], Jolene T, NP       Future Appointments             In 1 month Jon Billings, NP [facility], [place]

## 2021-10-13 ENCOUNTER — Other Ambulatory Visit: Payer: Self-pay | Admitting: Nurse Practitioner

## 2021-10-13 NOTE — Telephone Encounter (Signed)
Requested Prescriptions  Pending Prescriptions Disp Refills  . buPROPion (WELLBUTRIN XL) 300 MG 24 hr tablet [Pharmacy Med Name: buPROPion HCl ER (XL) 300 MG Oral Tablet Extended Release 24 Hour] 90 tablet 0    Sig: Take 1 tablet by mouth once daily     Psychiatry: Antidepressants - bupropion Passed - 10/13/2021  7:39 AM      Passed - Completed PHQ-2 or PHQ-9 in the last 360 days      Passed - Last BP in normal range    BP Readings from Last 1 Encounters:  07/18/21 133/75         Passed - Valid encounter within last 6 months    Recent Outpatient Visits          2 months ago Current 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 episode ([place])   [facility] Jon Billings, NP   5 months ago Stress incontinence   [facility] Jon Billings, NP   8 months ago Incisional hernia, without obstruction or gangrene   [facility] Jon Billings, NP   11 months ago COVID-19   [facility], Megan P, DO   11 months ago Current 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 episode ([place])   [place], Jolene T, NP      Future Appointments            In 1 week Jon Billings, NP [facility], [place]

## 2021-10-22 NOTE — Progress Notes (Signed)
BP 138/88    Pulse 74    Temp 98.5 F (36.9 C) (Oral)    Ht 5' 6.5" (1.689 m)    Wt 279 lb 3.2 oz (126.6 kg)    LMP 09/24/2021 (Approximate)    SpO2 98%    BMI 44.39 kg/m    Subjective:    Patient ID: Lynn French, female    DOB: 03/23/1971, 50 y.o.   MRN: [id number]  HPI: Lynn French is a 50 y.o. female presenting on 10/23/2021 for comprehensive medical examination. Current medical complaints include:none  She currently lives with: Menopausal Symptoms: no  HYPERTENSION / HYPERLIPIDEMIA Satisfied with current treatment? no Duration of hypertension: years BP monitoring frequency: not checking BP range:  BP medication side effects: no Past BP meds: none Duration of hyperlipidemia: years Cholesterol medication side effects: no Cholesterol supplements: none Past cholesterol medications: rosuvastatin (crestor) Medication compliance: excellent compliance Aspirin: no Recent stressors: no Recurrent headaches: no Visual changes: no Palpitations: no Dyspnea: no Chest pain: no Lower extremity edema: no Dizzy/lightheaded: no  HYPOTHYROIDISM Thyroid control status:controlled Satisfied with current treatment? no Medication side effects: no Medication compliance: excellent compliance Etiology of hypothyroidism:  Recent dose adjustment:no Fatigue: getting better Cold intolerance: no Heat intolerance: no Weight gain: no Weight loss: no Constipation: no Diarrhea/loose stools: no Palpitations: no Lower extremity edema: no Anxiety/depressed mood: no  DEPRESSION/ANXIETY Patient states she feels like she is doing better. She has been going through something's recently which is why her score is higher.  Denies concerns at visit today.   Depression Screen done today and results listed below:  Depression screen [facility] 2/9 10/23/2021 07/18/2021 02/14/2021 10/31/2020 04/26/2020  Decreased Interest 1 2 0 2 2  Down, Depressed, Hopeless 1 0 0 0 0  PHQ - 2 Score 2 2 0 2 2  Altered  sleeping 2 2 1 2 1   Tired, decreased energy 2 2 1 2 2   Change in appetite 1 0 0 2 2  Feeling bad or failure about yourself  1 0 0 0 0  Trouble concentrating 2 1 1 1 1   Moving slowly or fidgety/restless 0 0 0 0 0  Suicidal thoughts 0 0 0 0 0  PHQ-9 Score 10 7 3 9 8   Difficult doing work/chores Somewhat difficult Somewhat difficult - Somewhat difficult -  Some recent data might be hidden    The patient does not have a history of falls. I did complete a risk assessment for falls. A plan of care for falls was documented.   Past Medical History:  Past Medical History:  Diagnosis Date   Abdominal hernia    Acid reflux    Anemia    Anxiety    Atypical squamous cells of undetermined significance (ASCUS) on Papanicolaou smear of cervix April 2016   HPV Negative, next pap due April 2017   B12 deficiency 2000s   history of B12 deficiency   Diabetes mellitus without complication ([place])    Hyperlipidemia    Hypothyroidism    Obesity    Tachycardia    with palpitations, negative work up.   Tobacco use     Surgical History:  Past Surgical History:  Procedure Laterality Date   APPENDECTOMY     BLADDER SURGERY     bladder stem enlarged   CESAREAN SECTION  11/17/11   CHOLECYSTECTOMY     TUBAL LIGATION  11/17/11   TYMPANOSTOMY TUBE PLACEMENT     WISDOM TOOTH EXTRACTION      Medications:  Current Outpatient Medications on File Prior to Visit  Medication Sig   acetaminophen (TYLENOL) 500 MG tablet Take 1,000 mg by mouth every 8 (eight) hours as needed.   bisacodyl (DULCOLAX) 5 MG EC tablet Take 5 mg by mouth daily as needed for moderate constipation.   buPROPion (WELLBUTRIN XL) 300 MG 24 hr tablet Take 1 tablet by mouth once daily   Cyanocobalamin (VITAMIN B-12) 5000 MCG SUBL Place 5,000 mcg under the tongue daily.   diclofenac Sodium (VOLTAREN) 1 % GEL Apply 2 g topically 4 (four) times daily.   levocetirizine (XYZAL) 5 MG tablet TAKE 1 TABLET BY MOUTH ONCE DAILY IN THE EVENING    levothyroxine (SYNTHROID) 150 MCG tablet Take 1 tablet (150 mcg total) by mouth daily.   mupirocin ointment (BACTROBAN) 2 % Apply 1 application topically 2 (two) times daily.   omeprazole (PRILOSEC) 40 MG capsule Take 1 capsule by mouth once daily   ondansetron (ZOFRAN-ODT) 4 MG disintegrating tablet DISSOLVE 1 TABLET IN MOUTH EVERY 8 HOURS AS NEEDED FOR NAUSEA FOR VOMITING   oxybutynin (DITROPAN XL) 10 MG 24 hr tablet Take 1 tablet (10 mg total) by mouth at bedtime.   valACYclovir (VALTREX) 1000 MG tablet Take 1 tablet (1,000 mg total) by mouth daily.   venlafaxine XR (EFFEXOR XR) 150 MG 24 hr capsule Take 1 capsule (150 mg total) by mouth daily with breakfast.   No current facility-administered medications on file prior to visit.    Allergies:  No Known Allergies  Social History:  Social History   Socioeconomic History   Marital status: Single    Spouse name: Not on file   Number of children: Not on file   Years of education: Not on file   Highest education level: Not on file  Occupational History   Not on file  Tobacco Use   Smoking status: Every Day    Packs/day: 0.50    Years: 20.00    Pack years: 10.00    Types: Cigarettes   Smokeless tobacco: Never  Vaping Use   Vaping Use: Former   Devices: Rare Use  Substance and Sexual Activity   Alcohol use: No    Alcohol/week: 0.0 standard drinks    Comment: very rare   Drug use: No   Sexual activity: Not Currently  Other Topics Concern   Not on file  Social History Narrative   Not on file   Social Determinants of Health   Financial Resource Strain: Not on file  Food Insecurity: Not on file  Transportation Needs: Not on file  Physical Activity: Not on file  Stress: Not on file  Social Connections: Not on file  Intimate Partner Violence: Not on file   Social History   Tobacco Use  Smoking Status Every Day   Packs/day: 0.50   Years: 20.00   Pack years: 10.00   Types: Cigarettes  Smokeless Tobacco Never    Social History   Substance and Sexual Activity  Alcohol Use No   Alcohol/week: 0.0 standard drinks   Comment: very rare    Family History:  Family History  Problem Relation Age of Onset   Mental illness Mother    Cancer Maternal Grandmother        breast   Allergies Son    Eczema Son     Past medical history, surgical history, medications, allergies, family history and social history reviewed with patient today and changes made to appropriate areas of the chart.   Review of Systems  Eyes:  Negative for blurred vision and double vision.  Respiratory:  Negative for shortness of breath.   Cardiovascular:  Negative for chest pain, palpitations and leg swelling.  Neurological:  Negative for dizziness and headaches.  Psychiatric/Behavioral:  Positive for depression. Negative for suicidal ideas. The patient is nervous/anxious.   All other ROS negative except what is listed above and in the HPI.      Objective:    BP 138/88    Pulse 74    Temp 98.5 F (36.9 C) (Oral)    Ht 5' 6.5" (1.689 m)    Wt 279 lb 3.2 oz (126.6 kg)    LMP 09/24/2021 (Approximate)    SpO2 98%    BMI 44.39 kg/m   Wt Readings from Last 3 Encounters:  10/23/21 279 lb 3.2 oz (126.6 kg)  07/18/21 278 lb 9.6 oz (126.4 kg)  04/17/21 286 lb (129.7 kg)    Physical Exam Vitals and nursing note reviewed. Exam conducted with a chaperone present Yvonna Alanis, [place]).  Constitutional:      General: She is awake. She is not in acute distress.    Appearance: She is well-developed. She is not ill-appearing.  HENT:     Head: Normocephalic and atraumatic.     Right Ear: Hearing, tympanic membrane, ear canal and external ear normal. No drainage.     Left Ear: Hearing, tympanic membrane, ear canal and external ear normal. No drainage.     Nose: Nose normal.     Right Sinus: No maxillary sinus tenderness or frontal sinus tenderness.     Left Sinus: No maxillary sinus tenderness or frontal sinus tenderness.      Mouth/Throat:     Mouth: Mucous membranes are moist.     Pharynx: Oropharynx is clear. Uvula midline. No pharyngeal swelling, oropharyngeal exudate or posterior oropharyngeal erythema.  Eyes:     General: Lids are normal.        Right eye: No discharge.        Left eye: No discharge.     Extraocular Movements: Extraocular movements intact.     Conjunctiva/sclera: Conjunctivae normal.     Pupils: Pupils are equal, round, and reactive to light.     Visual Fields: Right eye visual fields normal and left eye visual fields normal.  Neck:     Thyroid: No thyromegaly.     Vascular: No carotid bruit.     Trachea: Trachea normal.  Cardiovascular:     Rate and Rhythm: Normal rate and regular rhythm.     Heart sounds: Normal heart sounds. No murmur heard.   No gallop.  Pulmonary:     Effort: Pulmonary effort is normal. No accessory muscle usage or respiratory distress.     Breath sounds: Normal breath sounds.  Chest:  Breasts:    Right: Normal.     Left: Normal.  Abdominal:     General: Bowel sounds are normal.     Palpations: Abdomen is soft. There is no hepatomegaly or splenomegaly.     Tenderness: There is no abdominal tenderness.  Genitourinary:    Vagina: Normal.     Cervix: Normal.     Adnexa: Right adnexa normal and left adnexa normal.  Musculoskeletal:        General: Normal range of motion.     Cervical back: Normal range of motion and neck supple.     Right lower leg: No edema.     Left lower leg: No edema.  Lymphadenopathy:  Head:     Right side of head: No submental, submandibular, tonsillar, preauricular or posterior auricular adenopathy.     Left side of head: No submental, submandibular, tonsillar, preauricular or posterior auricular adenopathy.     Cervical: No cervical adenopathy.     Upper Body:     Right upper body: No supraclavicular, axillary or pectoral adenopathy.     Left upper body: No supraclavicular, axillary or pectoral adenopathy.  Skin:     General: Skin is warm and dry.     Capillary Refill: Capillary refill takes less than 2 seconds.     Findings: No rash.  Neurological:     Mental Status: She is alert and oriented to person, place, and time.     Gait: Gait is intact.     Deep Tendon Reflexes: Reflexes are normal and symmetric.     Reflex Scores:      Brachioradialis reflexes are 2+ on the right side and 2+ on the left side.      Patellar reflexes are 2+ on the right side and 2+ on the left side. Psychiatric:        Attention and Perception: Attention normal.        Mood and Affect: Mood normal.        Speech: Speech normal.        Behavior: Behavior normal. Behavior is cooperative.        Thought Content: Thought content normal.        Judgment: Judgment normal.    Results for orders placed or performed in visit on 07/18/21  Comp Met (CMET)  Result Value Ref Range   Glucose 102 (H) 65 - 99 mg/dL   BUN 8 6 - 24 mg/dL   Creatinine, Ser 0.84 0.57 - 1.00 mg/dL   eGFR 86 >59 mL/min/1.73   BUN/Creatinine Ratio 10 9 - 23   Sodium 138 134 - 144 mmol/L   Potassium 5.3 (H) 3.5 - 5.2 mmol/L   Chloride 100 96 - 106 mmol/L   CO2 25 20 - 29 mmol/L   Calcium 9.3 8.7 - 10.2 mg/dL   Total Protein 6.7 6.0 - 8.5 g/dL   Albumin 3.7 (L) 3.8 - 4.8 g/dL   Globulin, Total 3.0 1.5 - 4.5 g/dL   Albumin/Globulin Ratio 1.2 1.2 - 2.2   Bilirubin Total <0.2 0.0 - 1.2 mg/dL   Alkaline Phosphatase 126 (H) 44 - 121 IU/L   AST 18 0 - 40 IU/L   ALT 10 0 - 32 IU/L  Lipid Profile  Result Value Ref Range   Cholesterol, Total 210 (H) 100 - 199 mg/dL   Triglycerides 196 (H) 0 - 149 mg/dL   HDL 38 (L) >39 mg/dL   VLDL Cholesterol Cal 35 5 - 40 mg/dL   LDL Chol Calc (NIH) 137 (H) 0 - 99 mg/dL   Chol/HDL Ratio 5.5 (H) 0.0 - 4.4 ratio  HgB A1c  Result Value Ref Range   Hgb A1c MFr Bld 6.2 (H) 4.8 - 5.6 %   Est. average glucose Bld gHb Est-mCnc 131 mg/dL  TSH  Result Value Ref Range   TSH 2.490 0.450 - 4.500 uIU/mL  T4, free  Result Value  Ref Range   Free T4 1.22 0.82 - 1.77 ng/dL  B12  Result Value Ref Range   Vitamin B-12 >2000 (H) 232 - 1245 pg/mL      Assessment & Plan:   Problem List Items Addressed This Visit  Cardiovascular and Mediastinum   Benign hypertension    Chronic.  Controlled without medication..  Labs ordered today.  Return to clinic in 6 months for reevaluation.  Call sooner if concerns arise.        Relevant Medications   rosuvastatin (CRESTOR) 10 MG tablet   Aortic atherosclerosis (HCC)    Chronic.  Controlled.  Continue with current medication regimen on Crestor.  Will increase dose from 46m to 147mdaily.  Labs ordered today.  Return to clinic in 6 months for reevaluation.  Call sooner if concerns arise.        Relevant Medications   rosuvastatin (CRESTOR) 10 MG tablet     Endocrine   Hypothyroid    Chronic.  Controlled.  Continue with current medication regimen on levothyroxine 15058m  Labs ordered today.  Return to clinic in 6 months for reevaluation.  Call sooner if concerns arise.        Relevant Orders   TSH   T4, free   IFG (impaired fasting glucose)    Labs ordered today.  Will make recommendations based on lab results.       Relevant Orders   HgB A1c     Other   Anxiety    Chronic.  Controlled.  Continue with current medication regimen on Wellbutirn 300m40md Effexor 150mg15mly.  Labs ordered today.  Return to clinic in 6 months for reevaluation.  Call sooner if concerns arise.        Hyperlipidemia    Chronic.  Controlled.  Continue with current medication regimen on Crestor.  Will increase Crestor dose from 5mg t25m0mg d4m.  Labs ordered today.  Return to clinic in 6 months for reevaluation.  Call sooner if concerns arise.        Relevant Medications   rosuvastatin (CRESTOR) 10 MG tablet   Other Relevant Orders   Lipid panel   Morbid obesity (HCC)   [place] healthy lifestyle through diet and exercise.       Depression    Chronic.  Controlled.   Continue with current medication regimen on Wellbutirn 300mg an74mfexor 150mg dai29m Labs ordered today.  Return to clinic in 6 months for reevaluation.  Call sooner if concerns arise.        Other Visit Diagnoses     Annual physical exam    -  Primary   Health maintenance reviewed during visit. Labs ordered today. Mammogram and Cologuard ordered.    Relevant Orders   CBC with Differential/Platelet   Comprehensive metabolic panel   Lipid panel   TSH   Urinalysis, Routine w reflex microscopic   Cytology - PAP   Screening for cervical cancer       Relevant Orders   Cytology - PAP   Encounter for hepatitis C screening test for low risk patient       Relevant Orders   Hepatitis C Antibody   Encounter for screening mammogram for malignant neoplasm of breast       Relevant Orders   MM Digital Screening   Screening for colon cancer       Relevant Orders   Cologuard        Follow up plan: Return in about 6 months (around 04/23/2022) for HTN, HLD, DM2 FU.   LABORATORY TESTING:  - Pap smear: pap done  IMMUNIZATIONS:   - Tdap: Tetanus vaccination status reviewed: last tetanus booster within 10 years. - Influenza: Administered today - Pneumovax: Administered today -  Prevnar: Not applicable - COVID: Up to date - HPV: Not applicable - Shingrix vaccine: Not applicable  SCREENING: -Mammogram: Ordered today  - Colonoscopy:  cologuard   - Bone Density: Not applicable  -Hearing Test: Not applicable  -Spirometry: Not applicable   PATIENT COUNSELING:   Advised to take 1 mg of folate supplement per day if capable of pregnancy.   Sexuality: Discussed sexually transmitted diseases, partner selection, use of condoms, avoidance of unintended pregnancy  and contraceptive alternatives.   Advised to avoid cigarette smoking.  I discussed with the patient that most people either abstain from alcohol or drink within safe limits (<=14/week and <=4 drinks/occasion for males, <=7/weeks  and <= 3 drinks/occasion for females) and that the risk for alcohol disorders and other health effects rises proportionally with the number of drinks per week and how often a drinker exceeds daily limits.  Discussed cessation/primary prevention of drug use and availability of treatment for abuse.   Diet: Encouraged to adjust caloric intake to maintain  or achieve ideal body weight, to reduce intake of dietary saturated fat and total fat, to limit sodium intake by avoiding high sodium foods and not adding table salt, and to maintain adequate dietary potassium and calcium preferably from fresh fruits, vegetables, and low-fat dairy products.    stressed the importance of regular exercise  Injury prevention: Discussed safety belts, safety helmets, smoke detector, smoking near bedding or upholstery.   Dental health: Discussed importance of regular tooth brushing, flossing, and dental visits.    NEXT PREVENTATIVE PHYSICAL DUE IN 1 YEAR. Return in about 6 months (around 04/23/2022) for HTN, HLD, DM2 FU.

## 2021-10-23 ENCOUNTER — Other Ambulatory Visit: Payer: Self-pay

## 2021-10-23 ENCOUNTER — Ambulatory Visit (INDEPENDENT_AMBULATORY_CARE_PROVIDER_SITE_OTHER): Payer: 59 | Admitting: Nurse Practitioner

## 2021-10-23 ENCOUNTER — Other Ambulatory Visit (HOSPITAL_COMMUNITY)
Admission: RE | Admit: 2021-10-23 | Discharge: 2021-10-23 | Disposition: A | Discharge status: Home / Self Care | Payer: 59 | Source: Ambulatory Visit | Attending: Nurse Practitioner | Admitting: Nurse Practitioner

## 2021-10-23 ENCOUNTER — Encounter: Payer: Self-pay | Admitting: Nurse Practitioner

## 2021-10-23 VITALS — BP 138/88 | HR 74 | Temp 98.5°F | Ht 66.5 in | Wt 279.2 lb

## 2021-10-23 DIAGNOSIS — Z124 Encounter for screening for malignant neoplasm of cervix: Secondary | ICD-10-CM

## 2021-10-23 DIAGNOSIS — F320 Major depressive disorder, single episode, mild: Secondary | ICD-10-CM | POA: Diagnosis not present

## 2021-10-23 DIAGNOSIS — I700 Atherosclerosis of aorta: Secondary | ICD-10-CM

## 2021-10-23 DIAGNOSIS — Z23 Encounter for immunization: Secondary | ICD-10-CM | POA: Diagnosis not present

## 2021-10-23 DIAGNOSIS — E782 Mixed hyperlipidemia: Secondary | ICD-10-CM

## 2021-10-23 DIAGNOSIS — F419 Anxiety disorder, unspecified: Secondary | ICD-10-CM

## 2021-10-23 DIAGNOSIS — Z1211 Encounter for screening for malignant neoplasm of colon: Secondary | ICD-10-CM

## 2021-10-23 DIAGNOSIS — Z1159 Encounter for screening for other viral diseases: Secondary | ICD-10-CM

## 2021-10-23 DIAGNOSIS — Z0000 Encounter for general adult medical examination without abnormal findings: Secondary | ICD-10-CM | POA: Insufficient documentation

## 2021-10-23 DIAGNOSIS — D649 Anemia, unspecified: Secondary | ICD-10-CM

## 2021-10-23 DIAGNOSIS — I10 Essential (primary) hypertension: Secondary | ICD-10-CM | POA: Diagnosis not present

## 2021-10-23 DIAGNOSIS — E038 Other specified hypothyroidism: Secondary | ICD-10-CM

## 2021-10-23 DIAGNOSIS — R7301 Impaired fasting glucose: Secondary | ICD-10-CM

## 2021-10-23 DIAGNOSIS — Z1231 Encounter for screening mammogram for malignant neoplasm of breast: Secondary | ICD-10-CM

## 2021-10-23 LAB — URINALYSIS, ROUTINE W REFLEX MICROSCOPIC
Bilirubin, UA: NEGATIVE
Glucose, UA: NEGATIVE
Ketones, UA: NEGATIVE
Leukocytes,UA: NEGATIVE
Nitrite, UA: NEGATIVE
Protein,UA: NEGATIVE
RBC, UA: NEGATIVE
Specific Gravity, UA: <1.005 — ABNORMAL LOW (ref 1.005–1.030)
Urobilinogen, Ur: 0.2 mg/dL (ref 0.2–1.0)
pH, UA: 6.5 (ref 5.0–7.5)

## 2021-10-23 MED ORDER — ROSUVASTATIN CALCIUM 10 MG PO TABS: 10.0000 mg | ORAL_TABLET | Freq: Every day | ORAL | 1 refills | Status: DC

## 2021-10-23 NOTE — Assessment & Plan Note (Signed)
Recommend a healthy lifestyle through diet and exercise.

## 2021-10-23 NOTE — Assessment & Plan Note (Signed)
Chronic.  Controlled.  Continue with current medication regimen on Wellbutirn 350m and Effexor 1552mdaily.  Labs ordered today.  Return to clinic in 6 months for reevaluation.  Call sooner if concerns arise.

## 2021-10-23 NOTE — Assessment & Plan Note (Signed)
Labs ordered today.  Will make recommendations based on lab results.

## 2021-10-23 NOTE — Assessment & Plan Note (Signed)
Chronic.  Controlled.  Continue with current medication regimen on Crestor.  Will increase dose from 80m to 157mdaily.  Labs ordered today.  Return to clinic in 6 months for reevaluation.  Call sooner if concerns arise.

## 2021-10-23 NOTE — Patient Instructions (Signed)
Please call to schedule your mammogram and/or bone density: [facility]  Address: [address], [place], [place] [address]  Phone: [phone]

## 2021-10-23 NOTE — Assessment & Plan Note (Signed)
Chronic.  Controlled.  Continue with current medication regimen on Crestor.  Will increase Crestor dose from 69m to 128mdaily.  Labs ordered today.  Return to clinic in 6 months for reevaluation.  Call sooner if concerns arise.

## 2021-10-23 NOTE — Assessment & Plan Note (Signed)
Chronic.  Controlled.  Continue with current medication regimen on levothyroxine 175mg.  Labs ordered today.  Return to clinic in 6 months for reevaluation.  Call sooner if concerns arise.

## 2021-10-23 NOTE — Assessment & Plan Note (Signed)
Chronic.  Controlled.  Continue with current medication regimen on Wellbutirn 342m and Effexor 1540mdaily.  Labs ordered today.  Return to clinic in 6 months for reevaluation.  Call sooner if concerns arise.

## 2021-10-23 NOTE — Assessment & Plan Note (Signed)
Chronic.  Controlled without medication..  Labs ordered today.  Return to clinic in 6 months for reevaluation.  Call sooner if concerns arise.

## 2021-10-24 LAB — LIPID PANEL
Chol/HDL Ratio: 3.4 ratio (ref 0.0–4.4)
Cholesterol, Total: 151 mg/dL (ref 100–199)
HDL: 45 mg/dL (ref >39)
LDL Chol Calc (NIH): 88 mg/dL (ref 0–99)
Triglycerides: 95 mg/dL (ref 0–149)
VLDL Cholesterol Cal: 18 mg/dL (ref 5–40)

## 2021-10-24 LAB — CBC WITH DIFFERENTIAL/PLATELET
Basophils Absolute: 0.1 10*3/uL (ref 0.0–0.2)
Basos: 1 %
EOS (ABSOLUTE): 0.2 10*3/uL (ref 0.0–0.4)
Eos: 3 %
Hematocrit: 33.5 % — ABNORMAL LOW (ref 34.0–46.6)
Hemoglobin: 10 g/dL — ABNORMAL LOW (ref 11.1–15.9)
Immature Grans (Abs): 0 10*3/uL (ref 0.0–0.1)
Immature Granulocytes: 0 %
Lymphocytes Absolute: 2.5 10*3/uL (ref 0.7–3.1)
Lymphs: 30 %
MCH: 22.3 pg — ABNORMAL LOW (ref 26.6–33.0)
MCHC: 29.9 g/dL — ABNORMAL LOW (ref 31.5–35.7)
MCV: 75 fL — ABNORMAL LOW (ref 79–97)
Monocytes Absolute: 0.5 10*3/uL (ref 0.1–0.9)
Monocytes: 5 %
Neutrophils Absolute: 5.2 10*3/uL (ref 1.4–7.0)
Neutrophils: 61 %
Platelets: 425 10*3/uL (ref 150–450)
RBC: 4.49 x10E6/uL (ref 3.77–5.28)
RDW: 17.1 % — ABNORMAL HIGH (ref 11.7–15.4)
WBC: 8.5 10*3/uL (ref 3.4–10.8)

## 2021-10-24 LAB — COMPREHENSIVE METABOLIC PANEL
ALT: 12 IU/L (ref 0–32)
AST: 25 IU/L (ref 0–40)
Albumin/Globulin Ratio: 1.4 (ref 1.2–2.2)
Albumin: 4 g/dL (ref 3.8–4.8)
Alkaline Phosphatase: 118 IU/L (ref 44–121)
BUN/Creatinine Ratio: 16 (ref 9–23)
BUN: 13 mg/dL (ref 6–24)
Bilirubin Total: <0.2 mg/dL (ref 0.0–1.2)
CO2: 26 mmol/L (ref 20–29)
Calcium: 8.9 mg/dL (ref 8.7–10.2)
Chloride: 99 mmol/L (ref 96–106)
Creatinine, Ser: 0.81 mg/dL (ref 0.57–1.00)
Globulin, Total: 2.8 g/dL (ref 1.5–4.5)
Glucose: 95 mg/dL (ref 70–99)
Potassium: 4.9 mmol/L (ref 3.5–5.2)
Sodium: 137 mmol/L (ref 134–144)
Total Protein: 6.8 g/dL (ref 6.0–8.5)
eGFR: 89 mL/min/{1.73_m2} (ref >59)

## 2021-10-24 LAB — HEPATITIS C ANTIBODY: Hep C Virus Ab: <0.1 s/co ratio (ref 0.0–0.9)

## 2021-10-24 LAB — HEMOGLOBIN A1C
Est. average glucose Bld gHb Est-mCnc: 128 mg/dL
Hgb A1c MFr Bld: 6.1 % — ABNORMAL HIGH (ref 4.8–5.6)

## 2021-10-24 LAB — TSH: TSH: 0.524 u[IU]/mL (ref 0.450–4.500)

## 2021-10-24 LAB — T4, FREE: Free T4: 1.54 ng/dL (ref 0.82–1.77)

## 2021-10-24 NOTE — Progress Notes (Signed)
Please let patient know that her lab work shows that she is anemic. I would like her to come back and repeat lab work to determine the type of anemia.  I know she has had B12 deficiency in the past.  But it does not resemble B12 deficiency. I would like to draw an anemia panel to determine the cause. Also, make sure to complete the cologuard.   Otherwise her blood work looks good.  No concerns. A1c remains well controlled at 6.1.  Please let me know if she has any questions. Please make her a lab appt.

## 2021-10-24 NOTE — Addendum Note (Signed)
Addended by: Jon Billings on: 10/24/2021 08:22 AM   Modules accepted: Orders

## 2021-10-31 ENCOUNTER — Other Ambulatory Visit: Payer: 59

## 2021-10-31 ENCOUNTER — Other Ambulatory Visit: Payer: Self-pay

## 2021-10-31 DIAGNOSIS — D649 Anemia, unspecified: Secondary | ICD-10-CM

## 2021-10-31 DIAGNOSIS — D509 Iron deficiency anemia, unspecified: Secondary | ICD-10-CM

## 2021-11-01 LAB — ANEMIA PROFILE B
Basophils Absolute: 0.1 10*3/uL (ref 0.0–0.2)
Basos: 1 %
EOS (ABSOLUTE): 0.2 10*3/uL (ref 0.0–0.4)
Eos: 3 %
Ferritin: 7 ng/mL — ABNORMAL LOW (ref 15–150)
Folate: 2.9 ng/mL — ABNORMAL LOW
Hematocrit: 30.2 % — ABNORMAL LOW (ref 34.0–46.6)
Hemoglobin: 9.7 g/dL — ABNORMAL LOW (ref 11.1–15.9)
Immature Grans (Abs): 0 10*3/uL (ref 0.0–0.1)
Immature Granulocytes: 0 %
Iron Saturation: 6 % — CL (ref 15–55)
Iron: 23 ug/dL — ABNORMAL LOW (ref 27–159)
Lymphocytes Absolute: 3.1 10*3/uL (ref 0.7–3.1)
Lymphs: 37 %
MCH: 23.2 pg — ABNORMAL LOW (ref 26.6–33.0)
MCHC: 32.1 g/dL (ref 31.5–35.7)
MCV: 72 fL — ABNORMAL LOW (ref 79–97)
Monocytes Absolute: 0.4 10*3/uL (ref 0.1–0.9)
Monocytes: 5 %
Neutrophils Absolute: 4.6 10*3/uL (ref 1.4–7.0)
Neutrophils: 54 %
Platelets: 373 10*3/uL (ref 150–450)
RBC: 4.19 x10E6/uL (ref 3.77–5.28)
RDW: 17 % — ABNORMAL HIGH (ref 11.7–15.4)
Retic Ct Pct: 1.4 % (ref 0.6–2.6)
Total Iron Binding Capacity: 414 ug/dL (ref 250–450)
UIBC: 391 ug/dL (ref 131–425)
Vitamin B-12: >2000 pg/mL — ABNORMAL HIGH (ref 232–1245)
WBC: 8.4 10*3/uL (ref 3.4–10.8)

## 2021-11-01 NOTE — Progress Notes (Signed)
Please let patient know that her lab results show that she has iron deficiency anemia.  I recommend she begin Ferrous sulfate 366m daily.  This can be found over the counter.  I would like her to return in 2 weeks to recheck labs.  I have put in the orders. Please make her a lab appt.

## 2021-11-05 LAB — CYTOLOGY - PAP
Comment: NEGATIVE
Diagnosis: UNDETERMINED — AB
High risk HPV: NEGATIVE

## 2021-11-06 NOTE — Progress Notes (Signed)
Hi Lynn French. Your PAP came back with some abnormal cells.  Your previous PAP was normal.  The recommendation is that we repeat the PAP in 3 years.  Please let me know if you have any questions.

## 2021-11-13 ENCOUNTER — Other Ambulatory Visit: Payer: Self-pay | Admitting: Nurse Practitioner

## 2021-11-13 NOTE — Telephone Encounter (Signed)
Requested Prescriptions  Pending Prescriptions Disp Refills   valACYclovir (VALTREX) 1000 MG tablet [Pharmacy Med Name: valACYclovir HCl 1 GM Oral Tablet] 90 tablet 0    Sig: Take 1 tablet by mouth once daily     Antimicrobials:  Antiviral Agents - Anti-Herpetic Passed - 11/13/2021  2:20 PM      Passed - Valid encounter within last 12 months    Recent Outpatient Visits          3 weeks ago Annual physical exam   [facility] Jon Billings, NP   3 months ago Current 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 episode ([place])   [facility] Jon Billings, NP   7 months ago Stress incontinence   [facility] Jon Billings, NP   9 months ago Incisional hernia, without obstruction or gangrene   [facility] Jon Billings, NP   1 year ago COVID-19   [place], Barb Merino, DO      Future Appointments            In 5 months Jon Billings, NP [facility], [place]

## 2021-11-21 DIAGNOSIS — Z1211 Encounter for screening for malignant neoplasm of colon: Secondary | ICD-10-CM | POA: Diagnosis not present

## 2021-11-24 ENCOUNTER — Other Ambulatory Visit: Payer: Self-pay | Admitting: Nurse Practitioner

## 2021-11-24 NOTE — Telephone Encounter (Signed)
Requested Prescriptions  Pending Prescriptions Disp Refills   omeprazole (PRILOSEC) 40 MG capsule [Pharmacy Med Name: Omeprazole 40 MG Oral Capsule Delayed Release] 90 capsule 1    Sig: Take 1 capsule by mouth once daily     Gastroenterology: Proton Pump Inhibitors Passed - 11/24/2021  9:49 AM      Passed - Valid encounter within last 12 months    Recent Outpatient Visits          1 month ago Annual physical exam   [facility] Jon Billings, NP   4 months ago Current 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 episode ([place])   [facility] Jon Billings, NP   7 months ago Stress incontinence   [facility] Jon Billings, NP   9 months ago Incisional hernia, without obstruction or gangrene   [facility] Jon Billings, NP   1 year ago COVID-19   [place], Barb Merino, DO      Future Appointments            In 5 months Jon Billings, NP [facility], [place]

## 2021-11-29 LAB — COLOGUARD: COLOGUARD: NEGATIVE

## 2021-12-02 NOTE — Progress Notes (Signed)
Please let patient know her Cologuard was normal.  We will repeat it in 3 years.

## 2021-12-30 ENCOUNTER — Other Ambulatory Visit: Payer: Self-pay | Admitting: Nurse Practitioner

## 2021-12-31 NOTE — Telephone Encounter (Signed)
Requested Prescriptions  Pending Prescriptions Disp Refills   levothyroxine (SYNTHROID) 150 MCG tablet [Pharmacy Med Name: Levothyroxine Sodium 150 MCG Oral Tablet] 90 tablet 0    Sig: Take 1 tablet by mouth once daily     Endocrinology:  Hypothyroid Agents Passed - 12/30/2021  6:02 AM      Passed - TSH in normal range and within 360 days    TSH  Date Value Ref Range Status  10/23/2021 0.524 0.450 - 4.500 uIU/mL Final         Passed - Valid encounter within last 12 months    Recent Outpatient Visits          2 months ago Annual physical exam   [facility] Jon Billings, NP   5 months ago Current 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 episode ([place])   [facility] Jon Billings, NP   8 months ago Stress incontinence   [facility] Jon Billings, NP   10 months ago Incisional hernia, without obstruction or gangrene   [facility] Jon Billings, NP   1 year ago COVID-19   [place], Barb Merino, DO      Future Appointments            In 3 months Jon Billings, NP [facility], [place]

## 2022-01-19 ENCOUNTER — Other Ambulatory Visit: Payer: Self-pay | Admitting: Nurse Practitioner

## 2022-01-21 NOTE — Telephone Encounter (Signed)
Requested Prescriptions  ?Pending Prescriptions Disp Refills  ?? buPROPion (WELLBUTRIN XL) 300 MG 24 hr tablet [Pharmacy Med Name: buPROPion HCl ER (XL) 300 MG Oral Tablet Extended Release 24 Hour] 90 tablet 0  ?  Sig: Take 1 tablet by mouth once daily  ?  ? Psychiatry: Antidepressants - bupropion Passed - 01/19/2022 10:19 AM  ?  ?  Passed - Cr in normal range and within 360 days  ?  Creatinine  ?Date Value Ref Range Status  ?12/15/2012 0.66 0.60 - 1.30 mg/dL Final  ? ?Creatinine, Ser  ?Date Value Ref Range Status  ?10/23/2021 0.81 0.57 - 1.00 mg/dL Final  ?   ?  ?  Passed - AST in normal range and within 360 days  ?  AST  ?Date Value Ref Range Status  ?10/23/2021 25 0 - 40 IU/L Final  ? ?SGOT(AST)  ?Date Value Ref Range Status  ?12/15/2012 20 15 - 37 Unit/L Final  ?   ?  ?  Passed - ALT in normal range and within 360 days  ?  ALT  ?Date Value Ref Range Status  ?10/23/2021 12 0 - 32 IU/L Final  ? ?SGPT (ALT)  ?Date Value Ref Range Status  ?12/15/2012 22 12 - 78 U/L Final  ?   ?  ?  Passed - Completed PHQ-2 or PHQ-9 in the last 360 days  ?  ?  Passed - Last BP in normal range  ?  BP Readings from Last 1 Encounters:  ?10/23/21 138/88  ?   ?  ?  Passed - Valid encounter within last 6 months  ?  Recent Outpatient Visits   ?      ? 3 months ago Annual physical exam  ? [place], NP  ? 6 months ago Current 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 episode ([place])  ? [place], NP  ? 9 months ago Stress incontinence  ? [place], NP  ? 11 months ago Incisional hernia, without obstruction or gangrene  ? [place], NP  ? 1 year ago COVID-19  ? [place], [place] P, DO  ?  ?  ?Future Appointments   ?        ? In 3 months Jon Billings, NP [facility]  ?  ? ?  ?  ?  ? ? ?

## 2022-01-22 ENCOUNTER — Telehealth: Payer: Self-pay | Admitting: Nurse Practitioner

## 2022-01-22 NOTE — Telephone Encounter (Signed)
Patient came into the office to make a follow up appointment and stated that she is out of her  ?Wellbutrin and would like a refill until her next appointment April 6. Please advise ?

## 2022-01-22 NOTE — Telephone Encounter (Signed)
Patient is aware medication was already sent in yesterday. Patient states she received confirmation from the pharmacy medication is ready for pick up.  ?

## 2022-02-05 NOTE — Progress Notes (Signed)
? ?BP 129/87   Pulse 69   Temp 98.6 ?F (37 ?C) (Oral)   Wt 277 lb 6.4 oz (125.8 kg)   LMP 01/04/2022 (Approximate)   SpO2 98%   BMI 44.10 kg/m?   ? ?Subjective:  ? ? Patient ID: Lynn French, female    DOB: Apr 10, 1971, 51 y.o.   MRN: [id number] ? ?HPI: ?Lynn French is a 51 y.o. female ? ?Chief Complaint  ?Patient presents with  ? Depression  ?  FOLLOW UP   ? ?HYPERTENSION / HYPERLIPIDEMIA ?Satisfied with current treatment? yes ?Duration of hypertension: years ?BP monitoring frequency: not checking ?BP range:  ?BP medication side effects: no ?Past BP meds: none ?Duration of hyperlipidemia: years ?Cholesterol medication side effects: no ?Cholesterol supplements: none ?Past cholesterol medications: rosuvastatin (crestor) ?Medication compliance: excellent compliance ?Aspirin: no ?Recent stressors: no ?Recurrent headaches: no ?Visual changes: no ?Palpitations: no ?Dyspnea: no ?Chest pain: no ?Lower extremity edema: no ?Dizzy/lightheaded: no ? ?HYPOTHYROIDISM ?Thyroid control status:controlled ?Satisfied with current treatment? yes ?Medication side effects: no ?Medication compliance: excellent compliance ?Etiology of hypothyroidism:  ?Recent dose adjustment:no ?Fatigue: no ?Cold intolerance: no ?Heat intolerance: no ?Weight gain: no ?Weight loss: no ?Constipation: no ?Diarrhea/loose stools: no ?Palpitations: no ?Lower extremity edema: no ?Anxiety/depressed mood: yes ? ?DEPRESSION/ANXIETY ?Patient states she feels like her medication is working well.  Patient states some days are really bad. She would like something she can take as needed to help keep her calm. Denies SI. ? ?[place] Office Visit from 02/06/2022 in [place]  ?PHQ-9 Total Score 8  ? ?  ? ? ?  02/06/2022  ?  8:49 AM 10/23/2021  ?  9:05 AM 07/18/2021  ? 12:07 PM 02/07/2020  ? 11:13 AM  ?GAD 7 : Generalized Anxiety Score  ?Nervous, Anxious, on Edge 1 2 0 1  ?Control/stop worrying 1 1 1  0  ?Worry too much - different things 2 1 1 1    ?Trouble relaxing 1 1 1 1   ?Restless 1 2 2 2   ?Easily annoyed or irritable 3 0 2 1  ?Afraid - awful might happen 1 0 1 0  ?Total GAD 7 Score 10 7 8 6   ?Anxiety Difficulty Somewhat difficult Not difficult at all Somewhat difficult Somewhat difficult  ? ? ? ? ? ?Relevant past medical, surgical, family and social history reviewed and updated as indicated. Interim medical history since our last visit reviewed. ?Allergies and medications reviewed and updated. ? ?Review of Systems  ?Constitutional:  Negative for fatigue and unexpected weight change.  ?Eyes:  Negative for visual disturbance.  ?Respiratory:  Negative for cough, chest tightness and shortness of breath.   ?Cardiovascular:  Negative for chest pain, palpitations and leg swelling.  ?Gastrointestinal:  Negative for constipation and diarrhea.  ?Endocrine: Negative for cold intolerance and heat intolerance.  ?Neurological:  Negative for dizziness and headaches.  ?Psychiatric/Behavioral:  Positive for dysphoric mood. Negative for suicidal ideas. The patient is nervous/anxious.   ? ?Per HPI unless specifically indicated above ? ?   ?Objective:  ?  ?BP 129/87   Pulse 69   Temp 98.6 ?F (37 ?C) (Oral)   Wt 277 lb 6.4 oz (125.8 kg)   LMP 01/04/2022 (Approximate)   SpO2 98%   BMI 44.10 kg/m?   ?Wt Readings from Last 3 Encounters:  ?02/06/22 277 lb 6.4 oz (125.8 kg)  ?10/23/21 279 lb 3.2 oz (126.6 kg)  ?07/18/21 278 lb 9.6 oz (126.4 kg)  ?  ?Physical Exam ?Vitals and nursing  note reviewed.  ?Constitutional:   ?   General: She is not in acute distress. ?   Appearance: Normal appearance. She is obese. She is not ill-appearing, toxic-appearing or diaphoretic.  ?HENT:  ?   Head: Normocephalic.  ?   Right Ear: Tympanic membrane and external ear normal.  ?   Left Ear: Tympanic membrane and external ear normal.  ?   Nose: Nose normal.  ?   Mouth/Throat:  ?   Mouth: Mucous membranes are moist.  ?   Pharynx: Oropharynx is clear.  ?Eyes:  ?   General:     ?   Right eye: No  discharge.     ?   Left eye: No discharge.  ?   Extraocular Movements: Extraocular movements intact.  ?   Conjunctiva/sclera: Conjunctivae normal.  ?   Pupils: Pupils are equal, round, and reactive to light.  ?Cardiovascular:  ?   Rate and Rhythm: Normal rate and regular rhythm.  ?   Heart sounds: No murmur heard. ?Pulmonary:  ?   Effort: Pulmonary effort is normal. No respiratory distress.  ?   Breath sounds: Normal breath sounds. No wheezing or rales.  ?Musculoskeletal:  ?   Cervical back: Normal range of motion and neck supple.  ?Skin: ?   General: Skin is warm and dry.  ?   Capillary Refill: Capillary refill takes less than 2 seconds.  ?Neurological:  ?   General: No focal deficit present.  ?   Mental Status: She is alert and oriented to person, place, and time. Mental status is at baseline.  ?Psychiatric:     ?   Mood and Affect: Mood normal.     ?   Behavior: Behavior normal.     ?   Thought Content: Thought content normal.     ?   Judgment: Judgment normal.  ? ? ?Results for orders placed or performed in visit on 10/31/21  ?Anemia Profile B  ?Result Value Ref Range  ? Total Iron Binding Capacity 414 250 - 450 ug/dL  ? UIBC 391 131 - 425 ug/dL  ? Iron 23 (L) 27 - 159 ug/dL  ? Iron Saturation 6 (LL) 15 - 55 %  ? Ferritin 7 (L) 15 - 150 ng/mL  ? Vitamin B-12 >2000 (H) 232 - 1245 pg/mL  ? Folate 2.9 (L) >3.0 ng/mL  ? WBC 8.4 3.4 - 10.8 x10E3/uL  ? RBC 4.19 3.77 - 5.28 x10E6/uL  ? Hemoglobin 9.7 (L) 11.1 - 15.9 g/dL  ? Hematocrit 30.2 (L) 34.0 - 46.6 %  ? MCV 72 (L) 79 - 97 fL  ? MCH 23.2 (L) 26.6 - 33.0 pg  ? MCHC 32.1 31.5 - 35.7 g/dL  ? RDW 17.0 (H) 11.7 - 15.4 %  ? Platelets 373 150 - 450 x10E3/uL  ? Neutrophils 54 Not Estab. %  ? Lymphs 37 Not Estab. %  ? Monocytes 5 Not Estab. %  ? Eos 3 Not Estab. %  ? Basos 1 Not Estab. %  ? Neutrophils Absolute 4.6 1.4 - 7.0 x10E3/uL  ? Lymphocytes Absolute 3.1 0.7 - 3.1 x10E3/uL  ? Monocytes Absolute 0.4 0.1 - 0.9 x10E3/uL  ? EOS (ABSOLUTE) 0.2 0.0 - 0.4 x10E3/uL  ?  Basophils Absolute 0.1 0.0 - 0.2 x10E3/uL  ? Immature Granulocytes 0 Not Estab. %  ? Immature Grans (Abs) 0.0 0.0 - 0.1 x10E3/uL  ? Retic Ct Pct 1.4 0.6 - 2.6 %  ? ?   ?Assessment & Plan:  ? ?  Problem List Items Addressed This Visit   ? ?  ? Cardiovascular and Mediastinum  ? Benign hypertension  ?  Chronic.  Controlled.  Continue with current medication regimen.  Labs ordered today.  Return to clinic in 6 months for reevaluation.  Call sooner if concerns arise.  ? ?  ?  ? Relevant Orders  ? Comp Met (CMET)  ? Aortic atherosclerosis ([place]) - Primary  ?  Chronic.  Controlled.  Continue with current medication regimen on Crestor 32m daily.  Labs ordered today.  Return to clinic in 6 months for reevaluation.  Call sooner if concerns arise.  ? ?  ?  ?  ? Endocrine  ? Hypothyroid  ?  Chronic.  Controlled.  Continue with current medication regimen.  Labs ordered today.  Return to clinic in 6 months for reevaluation.  Call sooner if concerns arise.  ? ?  ?  ? Relevant Orders  ? TSH  ? T4, free  ? IFG (impaired fasting glucose)  ?  Labs ordered today.  Will make recommendations based on lab results. ?  ?  ? Relevant Orders  ? HgB A1c  ?  ? Other  ? Anxiety  ?  Chronic.  Controlled.  Continue with current medication regimen.  However, sometimes she is short tempered.  Would like something to help calm her down.  Will try Buspar 527mBID PRN.  Labs ordered today.  Return to clinic in 3 months for reevaluation.  Call sooner if concerns arise.  ? ?  ?  ? Relevant Medications  ? busPIRone (BUSPAR) 5 MG tablet  ? Morbid obesity ([place] ?  BMI is 44 at visit today.  Recommend a healthy lifestyle through diet and exercise.  Recommend smaller meals that priortize protein. ?  ?  ? Depression  ?  Chronic.  Controlled.  Continue with current medication regimen.  However, sometimes she is short tempered.  Would like something to help calm her down.  Will try Buspar 31m42mID PRN.  Labs ordered today.  Return to clinic in 3 months for  reevaluation.  Call sooner if concerns arise.  ? ?  ?  ? Relevant Medications  ? busPIRone (BUSPAR) 5 MG tablet  ? 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 Did not start Iron supplement.  She has been trying to increase green leafy vegetables

## 2022-02-06 ENCOUNTER — Ambulatory Visit (INDEPENDENT_AMBULATORY_CARE_PROVIDER_SITE_OTHER): Payer: 59 | Admitting: Nurse Practitioner

## 2022-02-06 ENCOUNTER — Encounter: Payer: Self-pay | Admitting: Nurse Practitioner

## 2022-02-06 VITALS — BP 129/87 | HR 69 | Temp 98.6°F | Wt 277.4 lb

## 2022-02-06 DIAGNOSIS — I700 Atherosclerosis of aorta: Secondary | ICD-10-CM

## 2022-02-06 DIAGNOSIS — D508 Other iron deficiency anemias: Secondary | ICD-10-CM

## 2022-02-06 DIAGNOSIS — D649 Anemia, unspecified: Secondary | ICD-10-CM | POA: Insufficient documentation

## 2022-02-06 DIAGNOSIS — R7301 Impaired fasting glucose: Secondary | ICD-10-CM | POA: Diagnosis not present

## 2022-02-06 DIAGNOSIS — I10 Essential (primary) hypertension: Secondary | ICD-10-CM

## 2022-02-06 DIAGNOSIS — R69 Illness, unspecified: Secondary | ICD-10-CM | POA: Diagnosis not present

## 2022-02-06 DIAGNOSIS — E038 Other specified hypothyroidism: Secondary | ICD-10-CM | POA: Diagnosis not present

## 2022-02-06 DIAGNOSIS — F320 Major depressive disorder, single episode, mild: Secondary | ICD-10-CM | POA: Diagnosis not present

## 2022-02-06 DIAGNOSIS — F419 Anxiety disorder, unspecified: Secondary | ICD-10-CM

## 2022-02-06 MED ORDER — BUSPIRONE HCL 5 MG PO TABS: 5.0000 mg | ORAL_TABLET | Freq: Two times a day (BID) | ORAL | 0 refills | Status: DC | PRN

## 2022-02-06 MED ORDER — MUPIROCIN 2 % EX OINT: 1.0000 "application " | TOPICAL_OINTMENT | Freq: Two times a day (BID) | CUTANEOUS | 0 refills | Status: DC

## 2022-02-06 NOTE — Assessment & Plan Note (Signed)
BMI is 44 at visit today.  Recommend a healthy lifestyle through diet and exercise.  Recommend smaller meals that priortize protein. ?

## 2022-02-06 NOTE — Assessment & Plan Note (Signed)
Chronic.  Controlled.  Continue with current medication regimen.  However, sometimes she is short tempered.  Would like something to help calm her down.  Will try Buspar 27m BID PRN.  Labs ordered today.  Return to clinic in 3 months for reevaluation.  Call sooner if concerns arise.  ? ?

## 2022-02-06 NOTE — Assessment & Plan Note (Signed)
Chronic.  Controlled.  Continue with current medication regimen.  Labs ordered today.  Return to clinic in 6 months for reevaluation.  Call sooner if concerns arise.  ? ?

## 2022-02-06 NOTE — Assessment & Plan Note (Signed)
Chronic.  Controlled.  Continue with current medication regimen.  However, sometimes she is short tempered.  Would like something to help calm her down.  Will try Buspar 3m BID PRN.  Labs ordered today.  Return to clinic in 3 months for reevaluation.  Call sooner if concerns arise.  ? ?

## 2022-02-06 NOTE — Assessment & Plan Note (Signed)
Labs ordered today.  Will make recommendations based on lab results. ?

## 2022-02-06 NOTE — Assessment & Plan Note (Signed)
Chronic. Did not start Iron supplement.  She has been trying to increase green leafy vegetables.  Will reevaluate labs at visit today.  Follow up in 3 months for reevaluation. Will make recommendations based on lab results. ?

## 2022-02-06 NOTE — Assessment & Plan Note (Signed)
Chronic.  Controlled.  Continue with current medication regimen on Crestor 33m daily.  Labs ordered today.  Return to clinic in 6 months for reevaluation.  Call sooner if concerns arise.  ? ?

## 2022-02-07 LAB — COMPREHENSIVE METABOLIC PANEL
ALT: 10 IU/L (ref 0–32)
AST: 19 IU/L (ref 0–40)
Albumin/Globulin Ratio: 1.3 (ref 1.2–2.2)
Albumin: 3.9 g/dL (ref 3.8–4.8)
Alkaline Phosphatase: 101 IU/L (ref 44–121)
BUN/Creatinine Ratio: 12 (ref 9–23)
BUN: 9 mg/dL (ref 6–24)
Bilirubin Total: <0.2 mg/dL (ref 0.0–1.2)
CO2: 26 mmol/L (ref 20–29)
Calcium: 8.9 mg/dL (ref 8.7–10.2)
Chloride: 101 mmol/L (ref 96–106)
Creatinine, Ser: 0.78 mg/dL (ref 0.57–1.00)
Globulin, Total: 2.9 g/dL (ref 1.5–4.5)
Glucose: 91 mg/dL (ref 70–99)
Potassium: 5.2 mmol/L (ref 3.5–5.2)
Sodium: 139 mmol/L (ref 134–144)
Total Protein: 6.8 g/dL (ref 6.0–8.5)
eGFR: 94 mL/min/{1.73_m2} (ref >59)

## 2022-02-07 LAB — ANEMIA PROFILE B
Basophils Absolute: 0.1 10*3/uL (ref 0.0–0.2)
Basos: 1 %
EOS (ABSOLUTE): 0.3 10*3/uL (ref 0.0–0.4)
Eos: 4 %
Ferritin: 8 ng/mL — ABNORMAL LOW (ref 15–150)
Folate: <2 ng/mL — ABNORMAL LOW (ref >3.0)
Hematocrit: 33.6 % — ABNORMAL LOW (ref 34.0–46.6)
Hemoglobin: 10.1 g/dL — ABNORMAL LOW (ref 11.1–15.9)
Immature Grans (Abs): 0 10*3/uL (ref 0.0–0.1)
Immature Granulocytes: 0 %
Iron Saturation: 6 % — CL (ref 15–55)
Iron: 23 ug/dL — ABNORMAL LOW (ref 27–159)
Lymphocytes Absolute: 2.9 10*3/uL (ref 0.7–3.1)
Lymphs: 36 %
MCH: 23 pg — ABNORMAL LOW (ref 26.6–33.0)
MCHC: 30.1 g/dL — ABNORMAL LOW (ref 31.5–35.7)
MCV: 77 fL — ABNORMAL LOW (ref 79–97)
Monocytes Absolute: 0.5 10*3/uL (ref 0.1–0.9)
Monocytes: 7 %
Neutrophils Absolute: 4.3 10*3/uL (ref 1.4–7.0)
Neutrophils: 52 %
Platelets: 342 10*3/uL (ref 150–450)
RBC: 4.39 x10E6/uL (ref 3.77–5.28)
RDW: 19.8 % — ABNORMAL HIGH (ref 11.7–15.4)
Retic Ct Pct: 1.3 % (ref 0.6–2.6)
Total Iron Binding Capacity: 414 ug/dL (ref 250–450)
UIBC: 391 ug/dL (ref 131–425)
Vitamin B-12: >2000 pg/mL — ABNORMAL HIGH (ref 232–1245)
WBC: 8.2 10*3/uL (ref 3.4–10.8)

## 2022-02-07 LAB — TSH: TSH: 1.13 u[IU]/mL (ref 0.450–4.500)

## 2022-02-07 LAB — T4, FREE: Free T4: 1.59 ng/dL (ref 0.82–1.77)

## 2022-02-07 LAB — HEMOGLOBIN A1C
Est. average glucose Bld gHb Est-mCnc: 123 mg/dL
Hgb A1c MFr Bld: 5.9 % — ABNORMAL HIGH (ref 4.8–5.6)

## 2022-02-07 NOTE — Progress Notes (Signed)
Please let patient know that her lab work looks good overall.  Iron studies haven't changed.  I recommend she take the Iron supplement as we discussed during the visit yesterday.  Otherwise, her blood work looks good.  No concerns at this time.  Follow up as discussed.

## 2022-02-09 ENCOUNTER — Other Ambulatory Visit: Payer: Self-pay | Admitting: Nurse Practitioner

## 2022-02-10 NOTE — Telephone Encounter (Signed)
Requested Prescriptions  ?Pending Prescriptions Disp Refills  ?? valACYclovir (VALTREX) 1000 MG tablet [Pharmacy Med Name: valACYclovir HCl 1 GM Oral Tablet] 90 tablet 0  ?  Sig: Take 1 tablet by mouth once daily  ?  ? Antimicrobials:  Antiviral Agents - Anti-Herpetic Passed - 02/09/2022 11:32 AM  ?  ?  Passed - Valid encounter within last 12 months  ?  Recent Outpatient Visits   ?      ? 4 days ago Aortic atherosclerosis ([place])  ? [place], NP  ? 3 months ago Annual physical exam  ? [place], NP  ? 6 months ago Current 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 episode [facility])  ? [place], NP  ? 9 months ago Stress incontinence  ? [place], NP  ? 12 months ago Incisional hernia, without obstruction or gangrene  ? [facility] Jon Billings, NP  ?  ?  ?Future Appointments   ?        ? In 3 months Jon Billings, NP [facility], [facility]  ?  ? ?  ?  ?  ? ?

## 2022-03-07 ENCOUNTER — Other Ambulatory Visit: Payer: Self-pay | Admitting: Nurse Practitioner

## 2022-03-07 DIAGNOSIS — N393 Stress incontinence (female) (male): Secondary | ICD-10-CM

## 2022-03-07 MED ORDER — VALACYCLOVIR HCL 1 G PO TABS
1000.0000 mg | ORAL_TABLET | Freq: Every day | ORAL | 1 refills | Status: DC
Start: 2022-03-07 — End: 2022-09-15

## 2022-03-07 MED ORDER — BUSPIRONE HCL 5 MG PO TABS: 5.0000 mg | ORAL_TABLET | Freq: Two times a day (BID) | ORAL | 1 refills | Status: DC | PRN

## 2022-03-07 MED ORDER — OMEPRAZOLE 40 MG PO CPDR: 40.0000 mg | DELAYED_RELEASE_CAPSULE | Freq: Every day | ORAL | 1 refills | Status: DC

## 2022-03-07 MED ORDER — LEVOTHYROXINE SODIUM 150 MCG PO TABS: 150.0000 ug | ORAL_TABLET | Freq: Every day | ORAL | 1 refills | Status: DC

## 2022-03-07 MED ORDER — VENLAFAXINE HCL ER 150 MG PO CP24: 150.0000 mg | ORAL_CAPSULE | Freq: Every day | ORAL | 1 refills | Status: DC

## 2022-03-07 MED ORDER — BUPROPION HCL ER (XL) 300 MG PO TB24: 300.0000 mg | ORAL_TABLET | Freq: Every day | ORAL | 1 refills | Status: DC

## 2022-03-07 MED ORDER — ROSUVASTATIN CALCIUM 10 MG PO TABS: 10.0000 mg | ORAL_TABLET | Freq: Every day | ORAL | 1 refills | Status: DC

## 2022-03-07 MED ORDER — OXYBUTYNIN CHLORIDE ER 10 MG PO TB24: 10.0000 mg | ORAL_TABLET | Freq: Every day | ORAL | 1 refills | Status: DC

## 2022-03-07 NOTE — Telephone Encounter (Signed)
Patient cam into the office and stated that she wants to transfer her RX to [facility] [place] and she also states that she needs a refill for  Buspirone  please advise  ?

## 2022-03-07 NOTE — Telephone Encounter (Signed)
Patient notified

## 2022-03-07 NOTE — Telephone Encounter (Signed)
Medication sent to the pharmacy for patient. ?

## 2022-03-19 ENCOUNTER — Telehealth: Payer: Self-pay | Admitting: Nurse Practitioner

## 2022-03-19 NOTE — Telephone Encounter (Signed)
Patient walked in and stated that her pharmacy states she needs a PA for omeprazole (PRILOSEC) 40 MG capsule. She states that she was told they are only approving her for 90 day supply for the entire year.  ? ?Patient also states she will run out if her ondansetron (ZOFRAN-ODT) 4 MG disintegrating tablet before her appointment on 7/12. ? ?She asks if this can be done and medication be [facility] [place] as she has changed pharmacies. ?

## 2022-03-20 MED ORDER — ONDANSETRON 4 MG PO TBDP: ORAL_TABLET | ORAL | 0 refills | Status: DC

## 2022-03-20 NOTE — Telephone Encounter (Signed)
PA has been initiated through covermymeds for Omeprazole. Awaiting determination.   Key: [id number]

## 2022-03-21 NOTE — Telephone Encounter (Signed)
PA has been approved for Omeprazole. LVM for patient.

## 2022-04-23 ENCOUNTER — Ambulatory Visit: Payer: 59 | Admitting: Nurse Practitioner

## 2022-05-13 NOTE — Progress Notes (Unsigned)
There were no vitals taken for this visit.   Subjective:    Patient ID: Lynn French, female    DOB: 07/27/1971, 51 y.o.   MRN: [id number]  HPI: Lynn French is a 51 y.o. female  No chief complaint on file.  HYPERTENSION / HYPERLIPIDEMIA Satisfied with current treatment? yes Duration of hypertension: years BP monitoring frequency: not checking BP range:  BP medication side effects: no Past BP meds: none Duration of hyperlipidemia: years Cholesterol medication side effects: no Cholesterol supplements: none Past cholesterol medications: rosuvastatin (crestor) Medication compliance: excellent compliance Aspirin: no Recent stressors: no Recurrent headaches: no Visual changes: no Palpitations: no Dyspnea: no Chest pain: no Lower extremity edema: no Dizzy/lightheaded: no  HYPOTHYROIDISM Thyroid control status:controlled Satisfied with current treatment? yes Medication side effects: no Medication compliance: excellent compliance Etiology of hypothyroidism:  Recent dose adjustment:no Fatigue: no Cold intolerance: no Heat intolerance: no Weight gain: no Weight loss: no Constipation: no Diarrhea/loose stools: no Palpitations: no Lower extremity edema: no Anxiety/depressed mood: yes  DEPRESSION/ANXIETY Patient states she feels like her medication is working well.  Patient states some days are really bad. She would like something she can take as needed to help keep her calm. Denies SI.  [place] Office Visit from 02/06/2022 in [place]  PHQ-9 Total Score 8         02/06/2022    8:49 AM 10/23/2021    9:05 AM 07/18/2021   12:07 PM 02/07/2020   11:13 AM  GAD 7 : Generalized Anxiety Score  Nervous, Anxious, on Edge 1 2 0 1  Control/stop worrying 1 1 1  0  Worry too much - different things 2 1 1 1   Trouble relaxing 1 1 1 1   Restless 1 2 2 2   Easily annoyed or irritable 3 0 2 1  Afraid - awful might happen 1 0 1 0  Total GAD 7 Score 10 7 8 6    Anxiety Difficulty Somewhat difficult Not difficult at all Somewhat difficult Somewhat difficult       Relevant past medical, surgical, family and social history reviewed and updated as indicated. Interim medical history since our last visit reviewed. Allergies and medications reviewed and updated.  Review of Systems  Constitutional:  Negative for fatigue and unexpected weight change.  Eyes:  Negative for visual disturbance.  Respiratory:  Negative for cough, chest tightness and shortness of breath.   Cardiovascular:  Negative for chest pain, palpitations and leg swelling.  Gastrointestinal:  Negative for constipation and diarrhea.  Endocrine: Negative for cold intolerance and heat intolerance.  Neurological:  Negative for dizziness and headaches.  Psychiatric/Behavioral:  Positive for dysphoric mood. Negative for suicidal ideas. The patient is nervous/anxious.     Per HPI unless specifically indicated above     Objective:    There were no vitals taken for this visit.  Wt Readings from Last 3 Encounters:  02/06/22 277 lb 6.4 oz (125.8 kg)  10/23/21 279 lb 3.2 oz (126.6 kg)  07/18/21 278 lb 9.6 oz (126.4 kg)    Physical Exam Vitals and nursing note reviewed.  Constitutional:      General: She is not in acute distress.    Appearance: Normal appearance. She is obese. She is not ill-appearing, toxic-appearing or diaphoretic.  HENT:     Head: Normocephalic.     Right Ear: Tympanic membrane and external ear normal.     Left Ear: Tympanic membrane and external ear normal.     Nose: Nose  normal.     Mouth/Throat:     Mouth: Mucous membranes are moist.     Pharynx: Oropharynx is clear.  Eyes:     General:        Right eye: No discharge.        Left eye: No discharge.     Extraocular Movements: Extraocular movements intact.     Conjunctiva/sclera: Conjunctivae normal.     Pupils: Pupils are equal, round, and reactive to light.  Cardiovascular:     Rate and Rhythm: Normal  rate and regular rhythm.     Heart sounds: No murmur heard. Pulmonary:     Effort: Pulmonary effort is normal. No respiratory distress.     Breath sounds: Normal breath sounds. No wheezing or rales.  Musculoskeletal:     Cervical back: Normal range of motion and neck supple.  Skin:    General: Skin is warm and dry.     Capillary Refill: Capillary refill takes less than 2 seconds.  Neurological:     General: No focal deficit present.     Mental Status: She is alert and oriented to person, place, and time. Mental status is at baseline.  Psychiatric:        Mood and Affect: Mood normal.        Behavior: Behavior normal.        Thought Content: Thought content normal.        Judgment: Judgment normal.     Results for orders placed or performed in visit on 02/06/22  Comp Met (CMET)  Result Value Ref Range   Glucose 91 70 - 99 mg/dL   BUN 9 6 - 24 mg/dL   Creatinine, Ser 0.78 0.57 - 1.00 mg/dL   eGFR 94 >59 mL/min/1.73   BUN/Creatinine Ratio 12 9 - 23   Sodium 139 134 - 144 mmol/L   Potassium 5.2 3.5 - 5.2 mmol/L   Chloride 101 96 - 106 mmol/L   CO2 26 20 - 29 mmol/L   Calcium 8.9 8.7 - 10.2 mg/dL   Total Protein 6.8 6.0 - 8.5 g/dL   Albumin 3.9 3.8 - 4.8 g/dL   Globulin, Total 2.9 1.5 - 4.5 g/dL   Albumin/Globulin Ratio 1.3 1.2 - 2.2   Bilirubin Total <0.2 0.0 - 1.2 mg/dL   Alkaline Phosphatase 101 44 - 121 IU/L   AST 19 0 - 40 IU/L   ALT 10 0 - 32 IU/L  TSH  Result Value Ref Range   TSH 1.130 0.450 - 4.500 uIU/mL  T4, free  Result Value Ref Range   Free T4 1.59 0.82 - 1.77 ng/dL  HgB A1c  Result Value Ref Range   Hgb A1c MFr Bld 5.9 (H) 4.8 - 5.6 %   Est. average glucose Bld gHb Est-mCnc 123 mg/dL  Anemia Profile B  Result Value Ref Range   Total Iron Binding Capacity 414 250 - 450 ug/dL   UIBC 391 131 - 425 ug/dL   Iron 23 (L) 27 - 159 ug/dL   Iron Saturation 6 (LL) 15 - 55 %   Ferritin 8 (L) 15 - 150 ng/mL   Vitamin B-12 >2000 (H) 232 - 1245 pg/mL   Folate  <2.0 (L) >3.0 ng/mL   WBC 8.2 3.4 - 10.8 x10E3/uL   RBC 4.39 3.77 - 5.28 x10E6/uL   Hemoglobin 10.1 (L) 11.1 - 15.9 g/dL   Hematocrit 33.6 (L) 34.0 - 46.6 %   MCV 77 (L) 79 - 97 fL   MCH  23.0 (L) 26.6 - 33.0 pg   MCHC 30.1 (L) 31.5 - 35.7 g/dL   RDW 19.8 (H) 11.7 - 15.4 %   Platelets 342 150 - 450 x10E3/uL   Neutrophils 52 Not Estab. %   Lymphs 36 Not Estab. %   Monocytes 7 Not Estab. %   Eos 4 Not Estab. %   Basos 1 Not Estab. %   Neutrophils Absolute 4.3 1.4 - 7.0 x10E3/uL   Lymphocytes Absolute 2.9 0.7 - 3.1 x10E3/uL   Monocytes Absolute 0.5 0.1 - 0.9 x10E3/uL   EOS (ABSOLUTE) 0.3 0.0 - 0.4 x10E3/uL   Basophils Absolute 0.1 0.0 - 0.2 x10E3/uL   Immature Granulocytes 0 Not Estab. %   Immature Grans (Abs) 0.0 0.0 - 0.1 x10E3/uL   Retic Ct Pct 1.3 0.6 - 2.6 %      Assessment & Plan:   Problem List Items Addressed This Visit       Cardiovascular and Mediastinum   Benign hypertension - Primary   Aortic atherosclerosis ([facility])     Endocrine   Hypothyroid   IFG (impaired fasting glucose)     Other   Anxiety   Hyperlipidemia   Depression     Follow up plan: No follow-ups on file.

## 2022-05-14 ENCOUNTER — Encounter: Payer: Self-pay | Admitting: Nurse Practitioner

## 2022-05-14 ENCOUNTER — Ambulatory Visit (INDEPENDENT_AMBULATORY_CARE_PROVIDER_SITE_OTHER): Payer: 59 | Admitting: Nurse Practitioner

## 2022-05-14 VITALS — BP 139/87 | HR 80 | Temp 97.9°F | Wt 277.4 lb

## 2022-05-14 DIAGNOSIS — D508 Other iron deficiency anemias: Secondary | ICD-10-CM | POA: Diagnosis not present

## 2022-05-14 DIAGNOSIS — I10 Essential (primary) hypertension: Secondary | ICD-10-CM

## 2022-05-14 DIAGNOSIS — E782 Mixed hyperlipidemia: Secondary | ICD-10-CM

## 2022-05-14 DIAGNOSIS — R69 Illness, unspecified: Secondary | ICD-10-CM | POA: Diagnosis not present

## 2022-05-14 DIAGNOSIS — R7301 Impaired fasting glucose: Secondary | ICD-10-CM | POA: Diagnosis not present

## 2022-05-14 DIAGNOSIS — F320 Major depressive disorder, single episode, mild: Secondary | ICD-10-CM

## 2022-05-14 DIAGNOSIS — E038 Other specified hypothyroidism: Secondary | ICD-10-CM

## 2022-05-14 DIAGNOSIS — I700 Atherosclerosis of aorta: Secondary | ICD-10-CM

## 2022-05-14 DIAGNOSIS — F419 Anxiety disorder, unspecified: Secondary | ICD-10-CM

## 2022-05-14 MED ORDER — DICLOFENAC SODIUM 1 % EX GEL
2.0000 g | Freq: Four times a day (QID) | CUTANEOUS | 2 refills | Status: DC
Start: 2022-05-14 — End: 2023-07-15

## 2022-05-14 NOTE — Assessment & Plan Note (Signed)
Chronic.  Controlled.  Continue with current medication regimen.  Labs ordered today.  Return to clinic in 6 months for reevaluation.  Call sooner if concerns arise.  ? ?

## 2022-05-14 NOTE — Assessment & Plan Note (Signed)
Chronic. On iron supplement.  Will check labs at visit today. Will make recommendations based on lab results.

## 2022-05-14 NOTE — Assessment & Plan Note (Signed)
Chronic.  Controlled.  Continue with current medication regimen on Crestor 10mg daily.  Labs ordered today.  Return to clinic in 6 months for reevaluation.  Call sooner if concerns arise.  ? ?

## 2022-05-14 NOTE — Assessment & Plan Note (Signed)
Chronic.  Controlled.  Continue with current medication regimen of levothyroxine daily.  Labs ordered today.  Return to clinic in 6 months for reevaluation.  Call sooner if concerns arise.

## 2022-05-14 NOTE — Assessment & Plan Note (Signed)
Labs ordered today.  Will make recommendations based on lab results. ?

## 2022-05-15 LAB — HEMOGLOBIN A1C
Est. average glucose Bld gHb Est-mCnc: 128 mg/dL
Hgb A1c MFr Bld: 6.1 % — ABNORMAL HIGH (ref 4.8–5.6)

## 2022-05-15 LAB — CBC WITH DIFFERENTIAL/PLATELET
Basophils Absolute: 0 10*3/uL (ref 0.0–0.2)
Basos: 1 %
EOS (ABSOLUTE): 0.3 10*3/uL (ref 0.0–0.4)
Eos: 3 %
Hematocrit: 39.9 % (ref 34.0–46.6)
Hemoglobin: 12.4 g/dL (ref 11.1–15.9)
Immature Grans (Abs): 0 10*3/uL (ref 0.0–0.1)
Immature Granulocytes: 0 %
Lymphocytes Absolute: 2.9 10*3/uL (ref 0.7–3.1)
Lymphs: 33 %
MCH: 26 pg — ABNORMAL LOW (ref 26.6–33.0)
MCHC: 31.1 g/dL — ABNORMAL LOW (ref 31.5–35.7)
MCV: 84 fL (ref 79–97)
Monocytes Absolute: 0.6 10*3/uL (ref 0.1–0.9)
Monocytes: 7 %
Neutrophils Absolute: 4.8 10*3/uL (ref 1.4–7.0)
Neutrophils: 56 %
Platelets: 333 10*3/uL (ref 150–450)
RBC: 4.77 x10E6/uL (ref 3.77–5.28)
RDW: 17 % — ABNORMAL HIGH (ref 11.7–15.4)
WBC: 8.6 10*3/uL (ref 3.4–10.8)

## 2022-05-15 LAB — COMPREHENSIVE METABOLIC PANEL
ALT: 12 IU/L (ref 0–32)
AST: 14 IU/L (ref 0–40)
Albumin/Globulin Ratio: 1.3 (ref 1.2–2.2)
Albumin: 3.9 g/dL (ref 3.9–4.9)
Alkaline Phosphatase: 118 IU/L (ref 44–121)
BUN/Creatinine Ratio: 13 (ref 9–23)
BUN: 10 mg/dL (ref 6–24)
Bilirubin Total: <0.2 mg/dL (ref 0.0–1.2)
CO2: 26 mmol/L (ref 20–29)
Calcium: 9.6 mg/dL (ref 8.7–10.2)
Chloride: 99 mmol/L (ref 96–106)
Creatinine, Ser: 0.76 mg/dL (ref 0.57–1.00)
Globulin, Total: 3.1 g/dL (ref 1.5–4.5)
Glucose: 78 mg/dL (ref 70–99)
Potassium: 4.5 mmol/L (ref 3.5–5.2)
Sodium: 136 mmol/L (ref 134–144)
Total Protein: 7 g/dL (ref 6.0–8.5)
eGFR: 97 mL/min/{1.73_m2} (ref >59)

## 2022-05-15 LAB — LIPID PANEL
Chol/HDL Ratio: 3 ratio (ref 0.0–4.4)
Cholesterol, Total: 151 mg/dL (ref 100–199)
HDL: 51 mg/dL (ref >39)
LDL Chol Calc (NIH): 78 mg/dL (ref 0–99)
Triglycerides: 125 mg/dL (ref 0–149)
VLDL Cholesterol Cal: 22 mg/dL (ref 5–40)

## 2022-05-15 LAB — TSH: TSH: 0.179 u[IU]/mL — ABNORMAL LOW (ref 0.450–4.500)

## 2022-05-15 LAB — T4, FREE: Free T4: 1.38 ng/dL (ref 0.82–1.77)

## 2022-05-15 NOTE — Addendum Note (Signed)
Addended by: Larae Grooms on: 05/15/2022 08:05 AM   Modules accepted: Orders

## 2022-05-15 NOTE — Progress Notes (Signed)
Please let patient know that her lab work shows that her A1c remains well controlled at 6.1.  Her thyroid labs are slightly abnormal.  I'd like her to come back in 6 weeks as a lab visit and repeat this.  Complete blood count has improved.  Continue with the iron supplement it is making a difference.

## 2022-09-14 ENCOUNTER — Other Ambulatory Visit: Payer: Self-pay | Admitting: Nurse Practitioner

## 2022-09-15 NOTE — Telephone Encounter (Signed)
Requested Prescriptions  Pending Prescriptions Disp Refills   levothyroxine (SYNTHROID) 150 MCG tablet [Pharmacy Med Name: LEVOTHYROXINE 150 MCG TABLET] 90 tablet 0    Sig: TAKE 1 TABLET BY MOUTH EVERY DAY     Endocrinology:  Hypothyroid Agents Failed - 09/14/2022  9:06 AM      Failed - TSH in normal range and within 360 days    TSH  Date Value Ref Range Status  05/14/2022 0.179 (L) 0.450 - 4.500 uIU/mL Final         Passed - Valid encounter within last 12 months    Recent Outpatient Visits           4 months ago Benign hypertension   [facility] Larae Grooms, NP   7 months ago Aortic atherosclerosis (HCC)   [facility] Larae Grooms, NP   10 months ago Annual physical exam   [facility] Larae Grooms, NP   1 year ago Current 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)   [facility] Larae Grooms, NP   1 year ago Stress incontinence   [facility] Larae Grooms, NP       Future Appointments             In 2 months Larae Grooms, NP [facility]             valACYclovir (VALTREX) 1000 MG tablet [Pharmacy Med Name: VALACYCLOVIR HCL 1 GRAM TABLET] 90 tablet 0    Sig: TAKE 1 TABLET BY MOUTH EVERY DAY     Antimicrobials:  Antiviral Agents - Anti-Herpetic Passed - 09/14/2022  9:06 AM      Passed - Valid encounter within last 12 months    Recent Outpatient Visits           4 months ago Benign hypertension   [facility] Larae Grooms, NP   7 months ago Aortic atherosclerosis (HCC)   [facility] Larae Grooms, NP   10 months ago Annual physical exam   [facility] Larae Grooms, NP   1 year ago Current 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)   [facility] Larae Grooms, NP   1 year ago Stress incontinence   [facility] Larae Grooms, NP       Future Appointments             In 2 months Larae Grooms, NP [facility]             buPROPion (WELLBUTRIN XL) 300 MG 24 hr tablet [Pharmacy Med Name: BUPROPION HCL XL 300 MG TABLET] 90 tablet 0    Sig: TAKE 1 TABLET BY MOUTH EVERY DAY     Psychiatry: Antidepressants - bupropion Passed - 09/14/2022  9:06 AM      Passed - Cr in normal range and within 360 days    Creatinine  Date Value Ref Range Status  12/15/2012 0.66 0.60 - 1.30 mg/dL Final   Creatinine, Ser  Date Value Ref Range Status  05/14/2022 0.76 0.57 - 1.00 mg/dL Final         Passed - AST in normal range and within 360 days    AST  Date Value Ref Range Status  05/14/2022 14 0 - 40 IU/L Final   SGOT(AST)  Date Value Ref Range Status  12/15/2012 20 15 - 37 Unit/L Final         Passed - ALT  in normal range and within 360 days    ALT  Date Value Ref Range Status  05/14/2022 12 0 - 32 IU/L Final   SGPT (ALT)  Date Value Ref Range Status  12/15/2012 22 12 - 78 U/L Final         Passed - Completed PHQ-2 or PHQ-9 in the last 360 days      Passed - Last BP in normal range    BP Readings from Last 1 Encounters:  05/14/22 139/87         Passed - Valid encounter within last 6 months    Recent Outpatient Visits           4 months ago Benign hypertension   [facility] Larae Grooms, NP   7 months ago Aortic atherosclerosis (HCC)   [facility] Larae Grooms, NP   10 months ago Annual physical exam   [facility] Larae Grooms, NP   1 year ago Current 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)   [facility] Larae Grooms, NP   1 year ago Stress incontinence   [facility] Larae Grooms, NP       Future Appointments             In 2 months Larae Grooms, NP [facility]             rosuvastatin (CRESTOR) 10 MG tablet  [Pharmacy Med Name: ROSUVASTATIN CALCIUM 10 MG TAB] 90 tablet 0    Sig: TAKE 1 TABLET BY MOUTH EVERY DAY     Cardiovascular:  Antilipid - Statins 2 Failed - 09/14/2022  9:06 AM      Failed - Lipid Panel in normal range within the last 12 months    Cholesterol, Total  Date Value Ref Range Status  05/14/2022 151 100 - 199 mg/dL Final   LDL Chol Calc (NIH)  Date Value Ref Range Status  05/14/2022 78 0 - 99 mg/dL Final   HDL  Date Value Ref Range Status  05/14/2022 51 >39 mg/dL Final   Triglycerides  Date Value Ref Range Status  05/14/2022 125 0 - 149 mg/dL Final         Passed - Cr in normal range and within 360 days    Creatinine  Date Value Ref Range Status  12/15/2012 0.66 0.60 - 1.30 mg/dL Final   Creatinine, Ser  Date Value Ref Range Status  05/14/2022 0.76 0.57 - 1.00 mg/dL Final         Passed - Patient is not pregnant      Passed - Valid encounter within last 12 months    Recent Outpatient Visits           4 months ago Benign hypertension   [facility] Larae Grooms, NP   7 months ago Aortic atherosclerosis (HCC)   [facility] Larae Grooms, NP   10 months ago Annual physical exam   [facility] Larae Grooms, NP   1 year ago Current 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)   [facility] Larae Grooms, NP   1 year ago Stress incontinence   [facility] Larae Grooms, NP       Future Appointments             In 2 months Larae Grooms, NP [facility]             venlafaxine  XR (EFFEXOR-XR) 150 MG 24 hr capsule [Pharmacy Med Name: VENLAFAXINE HCL ER 150 MG CAP] 90 capsule 0    Sig: TAKE 1 CAPSULE BY MOUTH DAILY WITH BREAKFAST.     Psychiatry: Antidepressants - SNRI - desvenlafaxine & venlafaxine Failed - 09/14/2022  9:06 AM      Failed - Lipid Panel in normal range within the last 12 months    Cholesterol, Total   Date Value Ref Range Status  05/14/2022 151 100 - 199 mg/dL Final   LDL Chol Calc (NIH)  Date Value Ref Range Status  05/14/2022 78 0 - 99 mg/dL Final   HDL  Date Value Ref Range Status  05/14/2022 51 >39 mg/dL Final   Triglycerides  Date Value Ref Range Status  05/14/2022 125 0 - 149 mg/dL Final         Passed - Cr in normal range and within 360 days    Creatinine  Date Value Ref Range Status  12/15/2012 0.66 0.60 - 1.30 mg/dL Final   Creatinine, Ser  Date Value Ref Range Status  05/14/2022 0.76 0.57 - 1.00 mg/dL Final         Passed - Completed PHQ-2 or PHQ-9 in the last 360 days      Passed - Last BP in normal range    BP Readings from Last 1 Encounters:  05/14/22 139/87         Passed - Valid encounter within last 6 months    Recent Outpatient Visits           4 months ago Benign hypertension   [facility] Larae Grooms, NP   7 months ago Aortic atherosclerosis (HCC)   [facility] Larae Grooms, NP   10 months ago Annual physical exam   [facility] Larae Grooms, NP   1 year ago Current 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

## 2022-09-17 ENCOUNTER — Other Ambulatory Visit: Payer: Self-pay | Admitting: Nurse Practitioner

## 2022-09-18 NOTE — Telephone Encounter (Signed)
Requested medication (s) are due for refill today - yes  Requested medication (s) are on the active medication list -yes  Future visit scheduled -yes  Last refill: 03/20/22 #18  Notes to clinic: non delegated Rx  Requested Prescriptions  Pending Prescriptions Disp Refills   ondansetron (ZOFRAN-ODT) 4 MG disintegrating tablet [Pharmacy Med Name: ONDANSETRON ODT 4 MG TABLET] 18 tablet 0    Sig: DISSOLVE 1 TABLET IN MOUTH EVERY 8 HOURS AS NEEDED FOR NAUSEA FOR VOMITING     Not Delegated - Gastroenterology: Antiemetics - ondansetron Failed - 09/17/2022  7:26 PM      Failed - This refill cannot be delegated      Passed - AST in normal range and within 360 days    AST  Date Value Ref Range Status  05/14/2022 14 0 - 40 IU/L Final   SGOT(AST)  Date Value Ref Range Status  12/15/2012 20 15 - 37 Unit/L Final         Passed - ALT in normal range and within 360 days    ALT  Date Value Ref Range Status  05/14/2022 12 0 - 32 IU/L Final   SGPT (ALT)  Date Value Ref Range Status  12/15/2012 22 12 - 78 U/L Final         Passed - Valid encounter within last 6 months    Recent Outpatient Visits           4 months ago Benign hypertension   [facility] Larae Grooms, NP   7 months ago Aortic atherosclerosis (HCC)   [facility] Larae Grooms, NP   11 months ago Annual physical exam   [facility] Larae Grooms, NP   1 year ago Current 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)   [facility] Larae Grooms, NP   1 year ago Stress incontinence   [facility] Larae Grooms, NP       Future Appointments             In 2 months Larae Grooms, NP [facility]               Requested Prescriptions  Pending Prescriptions Disp Refills   ondansetron (ZOFRAN-ODT) 4 MG disintegrating tablet [Pharmacy Med Name: ONDANSETRON ODT 4 MG TABLET] 18 tablet 0     Sig: DISSOLVE 1 TABLET IN MOUTH EVERY 8 HOURS AS NEEDED FOR NAUSEA FOR VOMITING     Not Delegated - Gastroenterology: Antiemetics - ondansetron Failed - 09/17/2022  7:26 PM      Failed - This refill cannot be delegated      Passed - AST in normal range and within 360 days    AST  Date Value Ref Range Status  05/14/2022 14 0 - 40 IU/L Final   SGOT(AST)  Date Value Ref Range Status  12/15/2012 20 15 - 37 Unit/L Final         Passed - ALT in normal range and within 360 days    ALT  Date Value Ref Range Status  05/14/2022 12 0 - 32 IU/L Final   SGPT (ALT)  Date Value Ref Range Status  12/15/2012 22 12 - 78 U/L Final         Passed - Valid encounter within last 6 months    Recent Outpatient Visits           4 months ago Benign hypertension   [facility] Larae Grooms, NP  7 months ago Aortic atherosclerosis [facility])   [place], NP   11 months ago Annual physical exam   [facility] Jon Billings, NP   1 year ago Current 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 episode ([place])   [place] Jon Billings, NP   1 year ago Stress incontinence   [place], NP       Future Appointments             In 2 months Jon Billings, NP [facility] [place]

## 2022-11-18 ENCOUNTER — Encounter: Payer: 59 | Admitting: Nurse Practitioner

## 2022-12-02 NOTE — Progress Notes (Unsigned)
There were no vitals taken for this visit.   Subjective:    Patient ID: Lynn French, female    DOB: 1971/05/21, 52 y.o.   MRN: [id number]  HPI: Lynn French is a 53 y.o. female presenting on 12/03/2022 for comprehensive medical examination. Current medical complaints include:none  She currently lives with: Menopausal Symptoms: no  HYPERTENSION / HYPERLIPIDEMIA Satisfied with current treatment? no Duration of hypertension: years BP monitoring frequency: not checking BP range:  BP medication side effects: no Past BP meds: none Duration of hyperlipidemia: years Cholesterol medication side effects: no Cholesterol supplements: none Past cholesterol medications: rosuvastatin (crestor) Medication compliance: excellent compliance Aspirin: no Recent stressors: no Recurrent headaches: no Visual changes: no Palpitations: no Dyspnea: no Chest pain: no Lower extremity edema: no Dizzy/lightheaded: no  HYPOTHYROIDISM Thyroid control status:controlled Satisfied with current treatment? no Medication side effects: no Medication compliance: excellent compliance Etiology of hypothyroidism:  Recent dose adjustment:no Fatigue: getting better Cold intolerance: no Heat intolerance: no Weight gain: no Weight loss: no Constipation: no Diarrhea/loose stools: no Palpitations: no Lower extremity edema: no Anxiety/depressed mood: no  DEPRESSION/ANXIETY Patient states she feels like she is doing better. She has been going through something's recently which is why her score is higher.  Denies concerns at visit today.   Depression Screen done today and results listed below:     05/14/2022    9:53 AM 02/06/2022    8:48 AM 10/23/2021    8:56 AM 07/18/2021   12:07 PM 02/14/2021   11:20 AM  Depression screen PHQ 2/9  Decreased Interest 2 1 1 2  0  Down, Depressed, Hopeless 0 1 1 0 0  PHQ - 2 Score 2 2 2 2  0  Altered sleeping 2 1 2 2 1   Tired, decreased energy 2 1 2 2 1   Change in  appetite 1 2 1  0 0  Feeling bad or failure about yourself  0 1 1 0 0  Trouble concentrating 1 1 2 1 1   Moving slowly or fidgety/restless 0 0 0 0 0  Suicidal thoughts 0 0 0 0 0  PHQ-9 Score 8 8 10 7 3   Difficult doing work/chores Not difficult at all Somewhat difficult Somewhat difficult Somewhat difficult     The patient does not have a history of falls. I did complete a risk assessment for falls. A plan of care for falls was documented.   Past Medical History:  Past Medical History:  Diagnosis Date   Abdominal hernia    Acid reflux    Anemia    Anxiety    Atypical squamous cells of undetermined significance (ASCUS) on Papanicolaou smear of cervix April 2016   HPV Negative, next pap due April 2017   B12 deficiency 2000s   history of B12 deficiency   Diabetes mellitus without complication ([place])    Hyperlipidemia    Hypothyroidism    Obesity    Tachycardia    with palpitations, negative work up.   Tobacco use     Surgical History:  Past Surgical History:  Procedure Laterality Date   APPENDECTOMY     BLADDER SURGERY     bladder stem enlarged   CESAREAN SECTION  11/17/11   CHOLECYSTECTOMY     TUBAL LIGATION  11/17/11   TYMPANOSTOMY TUBE PLACEMENT     WISDOM TOOTH EXTRACTION      Medications:  Current Outpatient Medications on File Prior to Visit  Medication Sig   ondansetron (ZOFRAN-ODT) 4 MG disintegrating tablet DISSOLVE 1  TABLET IN MOUTH EVERY 8 HOURS AS NEEDED FOR NAUSEA FOR VOMITING   acetaminophen (TYLENOL) 500 MG tablet Take 1,000 mg by mouth every 8 (eight) hours as needed.   bisacodyl (DULCOLAX) 5 MG EC tablet Take 5 mg by mouth daily as needed for moderate constipation.   buPROPion (WELLBUTRIN XL) 300 MG 24 hr tablet TAKE 1 TABLET BY MOUTH EVERY DAY   busPIRone (BUSPAR) 5 MG tablet Take 1 tablet (5 mg total) by mouth 2 (two) times daily as needed.   Cyanocobalamin (VITAMIN B-12) 5000 MCG SUBL Place 5,000 mcg under the tongue daily.   diclofenac Sodium  (VOLTAREN) 1 % GEL Apply 2 g topically 4 (four) times daily.   levocetirizine (XYZAL) 5 MG tablet TAKE 1 TABLET BY MOUTH ONCE DAILY IN THE EVENING   levothyroxine (SYNTHROID) 150 MCG tablet TAKE 1 TABLET BY MOUTH EVERY DAY   mupirocin ointment (BACTROBAN) 2 % Apply 1 application. topically 2 (two) times daily.   omeprazole (PRILOSEC) 40 MG capsule TAKE 1 CAPSULE (40 MG TOTAL) BY MOUTH DAILY.   oxybutynin (DITROPAN XL) 10 MG 24 hr tablet Take 1 tablet (10 mg total) by mouth at bedtime.   rosuvastatin (CRESTOR) 10 MG tablet TAKE 1 TABLET BY MOUTH EVERY DAY   valACYclovir (VALTREX) 1000 MG tablet TAKE 1 TABLET BY MOUTH EVERY DAY   venlafaxine XR (EFFEXOR-XR) 150 MG 24 hr capsule TAKE 1 CAPSULE BY MOUTH DAILY WITH BREAKFAST.   No current facility-administered medications on file prior to visit.    Allergies:  No Known Allergies  Social History:  Social History   Socioeconomic History   Marital status: Single    Spouse name: Not on file   Number of children: Not on file   Years of education: Not on file   Highest education level: Not on file  Occupational History   Not on file  Tobacco Use   Smoking status: Every Day    Packs/day: 0.50    Years: 20.00    Total pack years: 10.00    Types: Cigarettes   Smokeless tobacco: Never  Vaping Use   Vaping Use: Some days   Devices: Rare Use  Substance and Sexual Activity   Alcohol use: No    Alcohol/week: 0.0 standard drinks of alcohol    Comment: very rare   Drug use: No   Sexual activity: Not Currently  Other Topics Concern   Not on file  Social History Narrative   Not on file   Social Determinants of Health   Financial Resource Strain: Not on file  Food Insecurity: Not on file  Transportation Needs: Not on file  Physical Activity: Not on file  Stress: Not on file  Social Connections: Not on file  Intimate Partner Violence: Not on file   Social History   Tobacco Use  Smoking Status Every Day   Packs/day: 0.50   Years:  20.00   Total pack years: 10.00   Types: Cigarettes  Smokeless Tobacco Never   Social History   Substance and Sexual Activity  Alcohol Use No   Alcohol/week: 0.0 standard drinks of alcohol   Comment: very rare    Family History:  Family History  Problem Relation Age of Onset   Mental illness Mother    Cancer Maternal Grandmother        breast   Allergies Son    Eczema Son     Past medical history, surgical history, medications, allergies, family history and social history reviewed with patient today  and changes made to appropriate areas of the chart.   Review of Systems  Eyes:  Negative for blurred vision and double vision.  Respiratory:  Negative for shortness of breath.   Cardiovascular:  Negative for chest pain, palpitations and leg swelling.  Neurological:  Negative for dizziness and headaches.  Psychiatric/Behavioral:  Positive for depression. Negative for suicidal ideas. The patient is nervous/anxious.    All other ROS negative except what is listed above and in the HPI.      Objective:    There were no vitals taken for this visit.  Wt Readings from Last 3 Encounters:  05/14/22 277 lb 6.4 oz (125.8 kg)  02/06/22 277 lb 6.4 oz (125.8 kg)  10/23/21 279 lb 3.2 oz (126.6 kg)    Physical Exam Vitals and nursing note reviewed. Exam conducted with a chaperone present Wilhemena Durie, CMA).  Constitutional:      General: She is awake. She is not in acute distress.    Appearance: She is well-developed. She is not ill-appearing.  HENT:     Head: Normocephalic and atraumatic.     Right Ear: Hearing, tympanic membrane, ear canal and external ear normal. No drainage.     Left Ear: Hearing, tympanic membrane, ear canal and external ear normal. No drainage.     Nose: Nose normal.     Right Sinus: No maxillary sinus tenderness or frontal sinus tenderness.     Left Sinus: No maxillary sinus tenderness or frontal sinus tenderness.     Mouth/Throat:     Mouth: Mucous  membranes are moist.     Pharynx: Oropharynx is clear. Uvula midline. No pharyngeal swelling, oropharyngeal exudate or posterior oropharyngeal erythema.  Eyes:     General: Lids are normal.        Right eye: No discharge.        Left eye: No discharge.     Extraocular Movements: Extraocular movements intact.     Conjunctiva/sclera: Conjunctivae normal.     Pupils: Pupils are equal, round, and reactive to light.     Visual Fields: Right eye visual fields normal and left eye visual fields normal.  Neck:     Thyroid: No thyromegaly.     Vascular: No carotid bruit.     Trachea: Trachea normal.  Cardiovascular:     Rate and Rhythm: Normal rate and regular rhythm.     Heart sounds: Normal heart sounds. No murmur heard.    No gallop.  Pulmonary:     Effort: Pulmonary effort is normal. No accessory muscle usage or respiratory distress.     Breath sounds: Normal breath sounds.  Chest:  Breasts:    Right: Normal.     Left: Normal.  Abdominal:     General: Bowel sounds are normal.     Palpations: Abdomen is soft. There is no hepatomegaly or splenomegaly.     Tenderness: There is no abdominal tenderness.  Genitourinary:    Vagina: Normal.     Cervix: Normal.     Adnexa: Right adnexa normal and left adnexa normal.  Musculoskeletal:        General: Normal range of motion.     Cervical back: Normal range of motion and neck supple.     Right lower leg: No edema.     Left lower leg: No edema.  Lymphadenopathy:     Head:     Right side of head: No submental, submandibular, tonsillar, preauricular or posterior auricular adenopathy.     Left side  of head: No submental, submandibular, tonsillar, preauricular or posterior auricular adenopathy.     Cervical: No cervical adenopathy.     Upper Body:     Right upper body: No supraclavicular, axillary or pectoral adenopathy.     Left upper body: No supraclavicular, axillary or pectoral adenopathy.  Skin:    General: Skin is warm and dry.      Capillary Refill: Capillary refill takes less than 2 seconds.     Findings: No rash.  Neurological:     Mental Status: She is alert and oriented to person, place, and time.     Gait: Gait is intact.     Deep Tendon Reflexes: Reflexes are normal and symmetric.     Reflex Scores:      Brachioradialis reflexes are 2+ on the right side and 2+ on the left side.      Patellar reflexes are 2+ on the right side and 2+ on the left side. Psychiatric:        Attention and Perception: Attention normal.        Mood and Affect: Mood normal.        Speech: Speech normal.        Behavior: Behavior normal. Behavior is cooperative.        Thought Content: Thought content normal.        Judgment: Judgment normal.     Results for orders placed or performed in visit on 05/14/22  Comp Met (CMET)  Result Value Ref Range   Glucose 78 70 - 99 mg/dL   BUN 10 6 - 24 mg/dL   Creatinine, Ser 8.11 0.57 - 1.00 mg/dL   eGFR 97 >91 YN/WGN/5.62   BUN/Creatinine Ratio 13 9 - 23   Sodium 136 134 - 144 mmol/L   Potassium 4.5 3.5 - 5.2 mmol/L   Chloride 99 96 - 106 mmol/L   CO2 26 20 - 29 mmol/L   Calcium 9.6 8.7 - 10.2 mg/dL   Total Protein 7.0 6.0 - 8.5 g/dL   Albumin 3.9 3.9 - 4.9 g/dL   Globulin, Total 3.1 1.5 - 4.5 g/dL   Albumin/Globulin Ratio 1.3 1.2 - 2.2   Bilirubin Total <0.2 0.0 - 1.2 mg/dL   Alkaline Phosphatase 118 44 - 121 IU/L   AST 14 0 - 40 IU/L   ALT 12 0 - 32 IU/L  Lipid Profile  Result Value Ref Range   Cholesterol, Total 151 100 - 199 mg/dL   Triglycerides 130 0 - 149 mg/dL   HDL 51 >86 mg/dL   VLDL Cholesterol Cal 22 5 - 40 mg/dL   LDL Chol Calc (NIH) 78 0 - 99 mg/dL   Chol/HDL Ratio 3.0 0.0 - 4.4 ratio  HgB A1c  Result Value Ref Range   Hgb A1c MFr Bld 6.1 (H) 4.8 - 5.6 %   Est. average glucose Bld gHb Est-mCnc 128 mg/dL  TSH  Result Value Ref Range   TSH 0.179 (L) 0.450 - 4.500 uIU/mL  T4, free  Result Value Ref Range   Free T4 1.38 0.82 - 1.77 ng/dL  CBC w/Diff  Result  Value Ref Range   WBC 8.6 3.4 - 10.8 x10E3/uL   RBC 4.77 3.77 - 5.28 x10E6/uL   Hemoglobin 12.4 11.1 - 15.9 g/dL   Hematocrit 57.8 46.9 - 46.6 %   MCV 84 79 - 97 fL   MCH 26.0 (L) 26.6 - 33.0 pg   MCHC 31.1 (L) 31.5 - 35.7 g/dL   RDW 62.9 (  H) 11.7 - 15.4 %   Platelets 333 150 - 450 x10E3/uL   Neutrophils 56 Not Estab. %   Lymphs 33 Not Estab. %   Monocytes 7 Not Estab. %   Eos 3 Not Estab. %   Basos 1 Not Estab. %   Neutrophils Absolute 4.8 1.4 - 7.0 x10E3/uL   Lymphocytes Absolute 2.9 0.7 - 3.1 x10E3/uL   Monocytes Absolute 0.6 0.1 - 0.9 x10E3/uL   EOS (ABSOLUTE) 0.3 0.0 - 0.4 x10E3/uL   Basophils Absolute 0.0 0.0 - 0.2 x10E3/uL   Immature Granulocytes 0 Not Estab. %   Immature Grans (Abs) 0.0 0.0 - 0.1 x10E3/uL      Assessment & Plan:   Problem List Items Addressed This Visit       Cardiovascular and Mediastinum   Benign hypertension   Aortic atherosclerosis (HCC) - Primary     Endocrine   Hypothyroid   IFG (impaired fasting glucose)     Other   Anxiety   Hyperlipidemia   Morbid obesity (HCC)   Depression     Follow up plan: No follow-ups on file.   LABORATORY TESTING:  - Pap smear: pap done  IMMUNIZATIONS:   - Tdap: Tetanus vaccination status reviewed: last tetanus booster within 10 years. - Influenza: Administered today - Pneumovax: Administered today - Prevnar: Not applicable - COVID: Up to date - HPV: Not applicable - Shingrix vaccine: Not applicable  SCREENING: -Mammogram: Ordered today  - Colonoscopy:  cologuard   - Bone Density: Not applicable  -Hearing Test: Not applicable  -Spirometry: Not applicable   PATIENT COUNSELING:   Advised to take 1 mg of folate supplement per day if capable of pregnancy.   Sexuality: Discussed sexually transmitted diseases, partner selection, use of condoms, avoidance of unintended pregnancy  and contraceptive alternatives.   Advised to avoid cigarette smoking.  I discussed with the patient that most  people either abstain from alcohol or drink within safe limits (<=14/week and <=4 drinks/occasion for males, <=7/weeks and <= 3 drinks/occasion for females) and that the risk for alcohol disorders and other health effects rises proportionally with the number of drinks per week and how often a drinker exceeds daily limits.  Discussed cessation/primary prevention of drug use and availability of treatment for abuse.   Diet: Encouraged to adjust caloric intake to maintain  or achieve ideal body weight, to reduce intake of dietary saturated fat and total fat, to limit sodium intake by avoiding high sodium foods and not adding table salt, and to maintain adequate dietary potassium and calcium preferably from fresh fruits, vegetables, and low-fat dairy products.    stressed the importance of regular exercise  Injury prevention: Discussed safety belts, safety helmets, smoke detector, smoking near bedding or upholstery.   Dental health: Discussed importance of regular tooth brushing, flossing, and dental visits.    NEXT PREVENTATIVE PHYSICAL DUE IN 1 YEAR. No follow-ups on file.

## 2022-12-03 ENCOUNTER — Encounter: Payer: Self-pay | Admitting: Nurse Practitioner

## 2022-12-03 ENCOUNTER — Ambulatory Visit (INDEPENDENT_AMBULATORY_CARE_PROVIDER_SITE_OTHER): Payer: 59 | Admitting: Nurse Practitioner

## 2022-12-03 ENCOUNTER — Other Ambulatory Visit (HOSPITAL_COMMUNITY)
Admission: RE | Admit: 2022-12-03 | Discharge: 2022-12-03 | Disposition: A | Discharge status: Home / Self Care | Payer: 59 | Source: Ambulatory Visit | Attending: Nurse Practitioner | Admitting: Nurse Practitioner

## 2022-12-03 VITALS — BP 128/85 | HR 71 | Temp 97.8°F | Ht 67.5 in | Wt 282.1 lb

## 2022-12-03 DIAGNOSIS — Z23 Encounter for immunization: Secondary | ICD-10-CM

## 2022-12-03 DIAGNOSIS — F419 Anxiety disorder, unspecified: Secondary | ICD-10-CM

## 2022-12-03 DIAGNOSIS — R32 Unspecified urinary incontinence: Secondary | ICD-10-CM

## 2022-12-03 DIAGNOSIS — N393 Stress incontinence (female) (male): Secondary | ICD-10-CM | POA: Diagnosis not present

## 2022-12-03 DIAGNOSIS — F320 Major depressive disorder, single episode, mild: Secondary | ICD-10-CM

## 2022-12-03 DIAGNOSIS — Z1231 Encounter for screening mammogram for malignant neoplasm of breast: Secondary | ICD-10-CM

## 2022-12-03 DIAGNOSIS — R7301 Impaired fasting glucose: Secondary | ICD-10-CM | POA: Diagnosis not present

## 2022-12-03 DIAGNOSIS — I700 Atherosclerosis of aorta: Secondary | ICD-10-CM | POA: Diagnosis not present

## 2022-12-03 DIAGNOSIS — E782 Mixed hyperlipidemia: Secondary | ICD-10-CM

## 2022-12-03 DIAGNOSIS — E038 Other specified hypothyroidism: Secondary | ICD-10-CM | POA: Diagnosis not present

## 2022-12-03 DIAGNOSIS — Z0000 Encounter for general adult medical examination without abnormal findings: Secondary | ICD-10-CM | POA: Insufficient documentation

## 2022-12-03 DIAGNOSIS — I10 Essential (primary) hypertension: Secondary | ICD-10-CM

## 2022-12-03 DIAGNOSIS — R69 Illness, unspecified: Secondary | ICD-10-CM | POA: Diagnosis not present

## 2022-12-03 LAB — URINALYSIS, ROUTINE W REFLEX MICROSCOPIC
Bilirubin, UA: NEGATIVE
Glucose, UA: NEGATIVE
Ketones, UA: NEGATIVE
Leukocytes,UA: NEGATIVE
Nitrite, UA: NEGATIVE
Protein,UA: NEGATIVE
RBC, UA: NEGATIVE
Specific Gravity, UA: <1.005 — ABNORMAL LOW (ref 1.005–1.030)
Urobilinogen, Ur: 0.2 mg/dL (ref 0.2–1.0)
pH, UA: 6 (ref 5.0–7.5)

## 2022-12-03 MED ORDER — OXYBUTYNIN CHLORIDE ER 10 MG PO TB24: 10.0000 mg | ORAL_TABLET | Freq: Every day | ORAL | 1 refills | Status: DC

## 2022-12-03 MED ORDER — VENLAFAXINE HCL ER 150 MG PO CP24: 150.0000 mg | ORAL_CAPSULE | Freq: Every day | ORAL | 1 refills | Status: DC

## 2022-12-03 MED ORDER — ROSUVASTATIN CALCIUM 10 MG PO TABS: 10.0000 mg | ORAL_TABLET | Freq: Every day | ORAL | 1 refills | Status: DC

## 2022-12-03 MED ORDER — BUPROPION HCL ER (XL) 300 MG PO TB24: 300.0000 mg | ORAL_TABLET | Freq: Every day | ORAL | 1 refills | Status: DC

## 2022-12-03 MED ORDER — VALACYCLOVIR HCL 1 G PO TABS: 1000.0000 mg | ORAL_TABLET | Freq: Every day | ORAL | 1 refills | Status: DC

## 2022-12-03 MED ORDER — BUSPIRONE HCL 5 MG PO TABS: 5.0000 mg | ORAL_TABLET | Freq: Two times a day (BID) | ORAL | 1 refills | Status: DC | PRN

## 2022-12-03 MED ORDER — LEVOTHYROXINE SODIUM 150 MCG PO TABS: 150.0000 ug | ORAL_TABLET | Freq: Every day | ORAL | 1 refills | Status: DC

## 2022-12-03 MED ORDER — OMEPRAZOLE 40 MG PO CPDR: 40.0000 mg | DELAYED_RELEASE_CAPSULE | Freq: Every day | ORAL | 1 refills | Status: DC

## 2022-12-03 NOTE — Assessment & Plan Note (Signed)
Chronic.  Controlled.  Continue with current medication regimen.  Labs ordered today.  Return to clinic in 6 months for reevaluation.  Call sooner if concerns arise.  ? ?

## 2022-12-03 NOTE — Assessment & Plan Note (Signed)
Labs ordered at visit today.  Will make recommendations based on lab results.   

## 2022-12-03 NOTE — Assessment & Plan Note (Signed)
Recommended eating smaller high protein, low fat meals more frequently and exercising 30 mins a day 5 times a week with a goal of 10-15lb weight loss in the next 3 months.  

## 2022-12-03 NOTE — Assessment & Plan Note (Signed)
Chronic.  Controlled.  Continue with current medication regimen on Crestor 10mg  daily.  Refills sent today.  Labs ordered today.  Return to clinic in 6 months for reevaluation.  Call sooner if concerns arise.

## 2022-12-03 NOTE — Assessment & Plan Note (Signed)
Chronic.  Controlled.  Continue with current medication regimen on Crestor 10mg daily.  Labs ordered today.  Return to clinic in 6 months for reevaluation.  Call sooner if concerns arise.  ? ?

## 2022-12-03 NOTE — Assessment & Plan Note (Signed)
Chronic.  Controlled.  Continue with current medication regimen of levothyroxine 130mcg daily.  Refills sent today.  Labs ordered today.  Return to clinic in 6 months for reevaluation.  Call sooner if concerns arise.

## 2022-12-04 LAB — COMPREHENSIVE METABOLIC PANEL
ALT: 10 IU/L (ref 0–32)
AST: 19 IU/L (ref 0–40)
Albumin/Globulin Ratio: 1.3 (ref 1.2–2.2)
Albumin: 4.1 g/dL (ref 3.9–4.9)
Alkaline Phosphatase: 119 IU/L (ref 44–121)
BUN/Creatinine Ratio: 8 — ABNORMAL LOW (ref 9–23)
BUN: 7 mg/dL (ref 6–24)
Bilirubin Total: 0.3 mg/dL (ref 0.0–1.2)
CO2: 27 mmol/L (ref 20–29)
Calcium: 9.2 mg/dL (ref 8.7–10.2)
Chloride: 97 mmol/L (ref 96–106)
Creatinine, Ser: 0.86 mg/dL (ref 0.57–1.00)
Globulin, Total: 3.1 g/dL (ref 1.5–4.5)
Glucose: 89 mg/dL (ref 70–99)
Potassium: 4.6 mmol/L (ref 3.5–5.2)
Sodium: 137 mmol/L (ref 134–144)
Total Protein: 7.2 g/dL (ref 6.0–8.5)
eGFR: 83 mL/min/{1.73_m2} (ref >59)

## 2022-12-04 LAB — CBC WITH DIFFERENTIAL/PLATELET
Basophils Absolute: 0.1 10*3/uL (ref 0.0–0.2)
Basos: 1 %
EOS (ABSOLUTE): 0.3 10*3/uL (ref 0.0–0.4)
Eos: 4 %
Hematocrit: 40.6 % (ref 34.0–46.6)
Hemoglobin: 13.3 g/dL (ref 11.1–15.9)
Immature Grans (Abs): 0 10*3/uL (ref 0.0–0.1)
Immature Granulocytes: 0 %
Lymphocytes Absolute: 2.9 10*3/uL (ref 0.7–3.1)
Lymphs: 37 %
MCH: 28.3 pg (ref 26.6–33.0)
MCHC: 32.8 g/dL (ref 31.5–35.7)
MCV: 86 fL (ref 79–97)
Monocytes Absolute: 0.4 10*3/uL (ref 0.1–0.9)
Monocytes: 5 %
Neutrophils Absolute: 4.3 10*3/uL (ref 1.4–7.0)
Neutrophils: 53 %
Platelets: 304 10*3/uL (ref 150–450)
RBC: 4.7 x10E6/uL (ref 3.77–5.28)
RDW: 14.2 % (ref 11.7–15.4)
WBC: 8 10*3/uL (ref 3.4–10.8)

## 2022-12-04 LAB — LIPID PANEL
Chol/HDL Ratio: 2.9 ratio (ref 0.0–4.4)
Cholesterol, Total: 151 mg/dL (ref 100–199)
HDL: 52 mg/dL (ref >39)
LDL Chol Calc (NIH): 79 mg/dL (ref 0–99)
Triglycerides: 110 mg/dL (ref 0–149)
VLDL Cholesterol Cal: 20 mg/dL (ref 5–40)

## 2022-12-04 LAB — HEMOGLOBIN A1C
Est. average glucose Bld gHb Est-mCnc: 128 mg/dL
Hgb A1c MFr Bld: 6.1 % — ABNORMAL HIGH (ref 4.8–5.6)

## 2022-12-04 LAB — TSH: TSH: 0.059 u[IU]/mL — ABNORMAL LOW (ref 0.450–4.500)

## 2022-12-04 NOTE — Progress Notes (Signed)
Hi Lynn French. It was nice to see you yesterday.  Your lab work looks good.  No concerns at this time. Continue with your current medication regimen.  Follow up as discussed.  Please let me know if you have any questions.

## 2022-12-10 LAB — CYTOLOGY - PAP
Adequacy: ABSENT
Comment: NEGATIVE
Diagnosis: UNDETERMINED — AB
High risk HPV: NEGATIVE

## 2022-12-11 MED ORDER — METRONIDAZOLE 500 MG PO TABS: 500.0000 mg | ORAL_TABLET | Freq: Two times a day (BID) | ORAL | 0 refills | Status: AC

## 2022-12-11 NOTE — Progress Notes (Signed)
Please let patient know her PAP was abnormal.  The guidelines recommend that we repeat it again next year.  She did have bacterial vaginosis on exam.  I have sent in a prescription for flagyl.  I recommend avoiding drinking alcohol while taking medication.

## 2022-12-11 NOTE — Addendum Note (Signed)
Addended by: Jon Billings on: 12/11/2022 12:37 PM   Modules accepted: Orders

## 2022-12-15 ENCOUNTER — Telehealth: Payer: Self-pay

## 2022-12-15 NOTE — Telephone Encounter (Signed)
Harrison and scheduled the patient's mammogram for 01/06/23 at 1:00 pm.  Called and LVM asking for patient to please call back for appointment information.   OK for PEC to give appointment information if the patient calls back.

## 2022-12-15 NOTE — Telephone Encounter (Signed)
-----   Message from Georgina Peer, [place] sent at 12/03/2022 11:14 AM EST ----- Scheduled mammo- Tuesday or Wednesday after 9

## 2023-01-06 ENCOUNTER — Ambulatory Visit
Admission: RE | Admit: 2023-01-06 | Discharge: 2023-01-06 | Disposition: A | Discharge status: Home / Self Care | Payer: 59 | Source: Ambulatory Visit | Attending: Nurse Practitioner | Admitting: Nurse Practitioner

## 2023-01-06 ENCOUNTER — Encounter: Payer: Self-pay | Admitting: Radiology

## 2023-01-06 DIAGNOSIS — Z1231 Encounter for screening mammogram for malignant neoplasm of breast: Secondary | ICD-10-CM | POA: Insufficient documentation

## 2023-01-08 NOTE — Progress Notes (Signed)
Please let patient know her Mammogram did not show any evidence of a malignancy.  The recommendation is to repeat the Mammogram in 1 year.  

## 2023-04-06 ENCOUNTER — Encounter: Payer: Self-pay | Admitting: Nurse Practitioner

## 2023-04-21 ENCOUNTER — Telehealth: Payer: Self-pay | Admitting: Nurse Practitioner

## 2023-04-21 NOTE — Telephone Encounter (Signed)
Patient came into office and brought a form stating [facility] in [place] needed prior authorization for her omeprazole. I informed patient that I would route form to her provider Larae Grooms, NP. Informed patient to allow 48-72 hours to respond. Patient request a phone call when completed. [phone]

## 2023-04-22 ENCOUNTER — Telehealth: Payer: Self-pay

## 2023-04-22 NOTE — Telephone Encounter (Signed)
PA initiated and submitted for Omeprazole via Cover My Meds. Key: [id number]

## 2023-04-23 NOTE — Telephone Encounter (Signed)
Called and informed pt that prior authorization was approved for omeprazole. Pt voiced understanding

## 2023-04-23 NOTE — Telephone Encounter (Signed)
PA approved. See other phone encounter where patient was notified of approval.

## 2023-06-03 ENCOUNTER — Ambulatory Visit: Payer: 59 | Admitting: Physician Assistant

## 2023-06-05 ENCOUNTER — Ambulatory Visit: Payer: 59 | Admitting: Physician Assistant

## 2023-06-08 ENCOUNTER — Ambulatory Visit: Payer: 59 | Admitting: Physician Assistant

## 2023-06-09 ENCOUNTER — Ambulatory Visit: Payer: 59 | Admitting: Physician Assistant

## 2023-06-16 ENCOUNTER — Other Ambulatory Visit: Payer: Self-pay | Admitting: Nurse Practitioner

## 2023-06-17 NOTE — Telephone Encounter (Signed)
Call to patient- due 6 month follow up. Per patient-she has to check on her insurance to make sure she has coverage. Courtesy 30 day Rx sent. Requested Prescriptions  Pending Prescriptions Disp Refills   buPROPion (WELLBUTRIN XL) 300 MG 24 hr tablet [Pharmacy Med Name: BUPROPION HCL XL 300 MG TABLET] 30 tablet 0    Sig: TAKE 1 TABLET BY MOUTH EVERY DAY     Psychiatry: Antidepressants - bupropion Failed - 06/16/2023  2:38 AM      Failed - Valid encounter within last 6 months    Recent Outpatient Visits           6 months ago Annual physical exam   [place] [facility] Larae Grooms, NP   1 year ago Benign hypertension   [place] [facility] Larae Grooms, NP   1 year ago Aortic atherosclerosis [facility])   [place] [facility] Larae Grooms, NP   1 year ago Annual physical exam   [place] [facility] Larae Grooms, NP   1 year ago Current 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 episode [facility])   [place] [facility] Larae Grooms, NP              Passed - Cr in normal range and within 360 days    Creatinine  Date Value Ref Range Status  12/15/2012 0.66 0.60 - 1.30 mg/dL Final   Creatinine, Ser  Date Value Ref Range Status  12/03/2022 0.86 0.57 - 1.00 mg/dL Final         Passed - AST in normal range and within 360 days    AST  Date Value Ref Range Status  12/03/2022 19 0 - 40 IU/L Final   SGOT(AST)  Date Value Ref Range Status  12/15/2012 20 15 - 37 Unit/L Final         Passed - ALT in normal range and within 360 days    ALT  Date Value Ref Range Status  12/03/2022 10 0 - 32 IU/L Final   SGPT (ALT)  Date Value Ref Range Status  12/15/2012 22 12 - 78 U/L Final         Passed - Completed PHQ-2 or PHQ-9 in the last 360 days      Passed - Last BP in normal range    BP Readings from Last 1 Encounters:  12/03/22 128/85

## 2023-06-19 ENCOUNTER — Other Ambulatory Visit: Payer: Self-pay | Admitting: Nurse Practitioner

## 2023-06-19 NOTE — Telephone Encounter (Signed)
Called pt to schedule ov. Pt declines ov at this time. She needs to pay her health insurance and will then call for an appt.

## 2023-06-19 NOTE — Telephone Encounter (Signed)
Patient will need an office visit for further refills. Requested Prescriptions  Pending Prescriptions Disp Refills   levothyroxine (SYNTHROID) 150 MCG tablet [Pharmacy Med Name: LEVOTHYROXINE 150 MCG TABLET] 30 tablet 5    Sig: TAKE 1 TABLET BY MOUTH EVERY DAY     Endocrinology:  Hypothyroid Agents Failed - 06/19/2023  1:27 AM      Failed - TSH in normal range and within 360 days    TSH  Date Value Ref Range Status  12/03/2022 0.059 (L) 0.450 - 4.500 uIU/mL Final         Passed - Valid encounter within last 12 months    Recent Outpatient Visits           6 months ago Annual physical exam   [place] [facility]   1 year ago Benign hypertension   [facility], [facility]   1 year ago Aortic atherosclerosis [facility])   [place] [facility], NP   1 year ago Annual physical exam   [place] [facility], NP   1 year ago Current 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 episode [facility])   [place] [facility], NP               venlafaxine XR (EFFEXOR-XR) 150 MG 24 hr capsule [Pharmacy Med Name: VENLAFAXINE HCL ER 150 MG CAP] 30 capsule 0    Sig: TAKE 1 CAPSULE BY MOUTH DAILY WITH BREAKFAST.     Psychiatry: Antidepressants - SNRI - desvenlafaxine & venlafaxine Failed - 06/19/2023  1:27 AM      Failed - Valid encounter within last 6 months    Recent Outpatient Visits           6 months ago Annual physical exam   [place] [facility]   1 year ago Benign hypertension   [place] [facility] NP   1 year ago Aortic atherosclerosis [facility])   [place] [facility], NP   1 year ago Annual physical exam   [place] [facility] Larae Grooms, NP   1 year ago Current mild episode of major  depressive disorder without prior episode [facility])   [place] [facility], NP              Failed - Lipid Panel in normal range within the last 12 months    Cholesterol, Total  Date Value Ref Range Status  12/03/2022 151 100 - 199 mg/dL Final   LDL Chol Calc (NIH)  Date Value Ref Range Status  12/03/2022 79 0 - 99 mg/dL Final   HDL  Date Value Ref Range Status  12/03/2022 52 >39 mg/dL Final   Triglycerides  Date Value Ref Range Status  12/03/2022 110 0 - 149 mg/dL Final         Passed - Cr in normal range and within 360 days    Creatinine  Date Value Ref Range Status  12/15/2012 0.66 0.60 - 1.30 mg/dL Final   Creatinine, Ser  Date Value Ref Range Status  12/03/2022 0.86 0.57 - 1.00 mg/dL Final         Passed - Completed PHQ-2 or PHQ-9 in the last 360 days      Passed - Last BP in normal range    BP Readings from Last 1 Encounters:  12/03/22  128/85          rosuvastatin (CRESTOR) 10 MG tablet [Pharmacy Med Name: ROSUVASTATIN CALCIUM 10 MG TAB] 30 tablet 5    Sig: TAKE 1 TABLET BY MOUTH EVERY DAY     Cardiovascular:  Antilipid - Statins 2 Failed - 06/19/2023  1:27 AM      Failed - Lipid Panel in normal range within the last 12 months    Cholesterol, Total  Date Value Ref Range Status  12/03/2022 151 100 - 199 mg/dL Final   LDL Chol Calc (NIH)  Date Value Ref Range Status  12/03/2022 79 0 - 99 mg/dL Final   HDL  Date Value Ref Range Status  12/03/2022 52 >39 mg/dL Final   Triglycerides  Date Value Ref Range Status  12/03/2022 110 0 - 149 mg/dL Final         Passed - Cr in normal range and within 360 days    Creatinine  Date Value Ref Range Status  12/15/2012 0.66 0.60 - 1.30 mg/dL Final   Creatinine, Ser  Date Value Ref Range Status  12/03/2022 0.86 0.57 - 1.00 mg/dL Final         Passed - Patient is not pregnant      Passed - Valid encounter within last 12 months    Recent Outpatient Visits           6  months ago Annual physical exam   [place] [facility] Lar[facility], NP   1 year ago Benign hypertension   [place] [facility], [facility]   1 year ago Aortic atherosclerosis [facility])   [place] [facility], NP   1 year ago Annual physical exam   [place] [facility] Lar[facility] G[facility], NP   1 year ago Current 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 episode [facility])   [place] [facility] Larae Grooms, NP               valACYclovir (VALTREX) 1000 MG tablet [Pharmacy Med Name: VALACYCLOVIR HCL 1 GRAM TABLET] 30 tablet 5    Sig: TAKE 1 TABLET BY MOUTH EVERY DAY     Antimicrobials:  Antiviral Agents - Anti-Herpetic Passed - 06/19/2023  1:27 AM      Passed - Valid encounter within last 12 months    Recent Outpatient Visits           6 months ago Annual physical exam   [place] [facility], NP   1 year ago Benign hypertension   [place] [facility] Lar[facility], NP   1 year ago Aortic atherosclerosis [facility])   [place] [facility] Larae Grooms, NP   1 year ago Annual physical exam   [place] [facility] Lar[facility] Grooms, NP   1 year ago Current 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 episode [facility])    [place] [facility] Larae Grooms, NP

## 2023-06-19 NOTE — Telephone Encounter (Signed)
Requested medications are due for refill today.  yes  Requested medications are on the active medications list.  yes  Last refill. 12/03/2022 #90 1 rf  Future visit scheduled.   no  Notes to clinic.  Abnormal labs.    Requested Prescriptions  Pending Prescriptions Disp Refills   levothyroxine (SYNTHROID) 150 MCG tablet [Pharmacy Med Name: LEVOTHYROXINE 150 MCG TABLET] 30 tablet 5    Sig: TAKE 1 TABLET BY MOUTH EVERY DAY     Endocrinology:  Hypothyroid Agents Failed - 06/19/2023  1:27 AM      Failed - TSH in normal range and within 360 days    TSH  Date Value Ref Range Status  12/03/2022 0.059 (L) 0.450 - 4.500 uIU/mL Final         Passed - Valid encounter within last 12 months    Recent Outpatient Visits           6 months ago Annual physical exam   [place] [facility], [facility]   1 year ago Benign hypertension   [place] [facility] Larae Grooms, NP   1 year ago Aortic atherosclerosis [facility])   [place] [facility] Larae Grooms, NP   1 year ago Annual physical exam   [place] [facility] Larae Grooms, NP   1 year ago Current 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 episode [facility])   [place] [facility] Larae Grooms, NP              Signed Prescriptions Disp Refills   venlafaxine XR (EFFEXOR-XR) 150 MG 24 hr capsule 30 capsule 0    Sig: TAKE 1 CAPSULE BY MOUTH DAILY WITH BREAKFAST.     Psychiatry: Antidepressants - SNRI - desvenlafaxine & venlafaxine Failed - 06/19/2023  1:27 AM      Failed - Valid encounter within last 6 months    Recent Outpatient Visits           6 months ago Annual physical exam   [place] [facility] Larae G[facility], NP   1 year ago Benign hypertension   [place] [facility], NP   1 year ago Aortic atherosclerosis [facility])   [place] [facility]  Lar[facility] G[facility], NP   1 year ago Annual physical exam   [place] [facility] Larae Grooms, NP   1 year ago Current 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 episode [facility])   [place] [facility] Larae Grooms, NP              Failed - Lipid Panel in normal range within the last 12 months    Cholesterol, Total  Date Value Ref Range Status  12/03/2022 151 100 - 199 mg/dL Final   LDL Chol Calc (NIH)  Date Value Ref Range Status  12/03/2022 79 0 - 99 mg/dL Final   HDL  Date Value Ref Range Status  12/03/2022 52 >39 mg/dL Final   Triglycerides  Date Value Ref Range Status  12/03/2022 110 0 - 149 mg/dL Final         Passed - Cr in normal range and within 360 days    Creatinine  Date Value Ref Range Status  12/15/2012 0.66 0.60 - 1.30 mg/dL Final   Creatinine, Ser  Date Value Ref Range Status  12/03/2022 0.86 0.57 - 1.00 mg/dL Final         Passed - Completed  PHQ-2 or PHQ-9 in the last 360 days      Passed - Last BP in normal range    BP Readings from Last 1 Encounters:  12/03/22 128/85          rosuvastatin (CRESTOR) 10 MG tablet 30 tablet 5    Sig: TAKE 1 TABLET BY MOUTH EVERY DAY     Cardiovascular:  Antilipid - Statins 2 Failed - 06/19/2023  1:27 AM      Failed - Lipid Panel in normal range within the last 12 months    Cholesterol, Total  Date Value Ref Range Status  12/03/2022 151 100 - 199 mg/dL Final   LDL Chol Calc (NIH)  Date Value Ref Range Status  12/03/2022 79 0 - 99 mg/dL Final   HDL  Date Value Ref Range Status  12/03/2022 52 >39 mg/dL Final   Triglycerides  Date Value Ref Range Status  12/03/2022 110 0 - 149 mg/dL Final         Passed - Cr in normal range and within 360 days    Creatinine  Date Value Ref Range Status  12/15/2012 0.66 0.60 - 1.30 mg/dL Final   Creatinine, Ser  Date Value Ref Range Status  12/03/2022 0.86 0.57 - 1.00 mg/dL Final         Passed - Patient is  not pregnant      Passed - Valid encounter within last 12 months    Recent Outpatient Visits           6 months ago Annual physical exam   [place] [facility]   1 year ago Benign hypertension   [place] [facility] Lar[facility] G[facility], NP   1 year ago Aortic atherosclerosis [facility])   [place] [facility] Larae Grooms, NP   1 year ago Annual physical exam   [place] [facility] Larae Grooms, NP   1 year ago Current 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 episode [facility])   [place] [facility] Larae Grooms, NP               valACYclovir (VALTREX) 1000 MG tablet 30 tablet 5    Sig: TAKE 1 TABLET BY MOUTH EVERY DAY     Antimicrobials:  Antiviral Agents - Anti-Herpetic Passed - 06/19/2023  1:27 AM      Passed - Valid encounter within last 12 months    Recent Outpatient Visits           6 months ago Annual physical exam   [place] [facility], [facility]   1 year ago Benign hypertension   [place] [facility], NP   1 year ago Aortic [facility])   [place] [facility] Larae Grooms, NP   1 year ago Annual physical exam   [place] [facility] Larae Grooms, NP   1 year ago Current 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 episode [facility])   [place] [facility] Lar[facility] Grooms, NP

## 2023-06-25 ENCOUNTER — Other Ambulatory Visit: Payer: Self-pay | Admitting: Nurse Practitioner

## 2023-06-25 NOTE — Telephone Encounter (Signed)
Requested Prescriptions  Pending Prescriptions Disp Refills   omeprazole (PRILOSEC) 40 MG capsule [Pharmacy Med Name: OMEPRAZOLE DR 40 MG CAPSULE] 90 capsule 0    Sig: TAKE 1 CAPSULE (40 MG TOTAL) BY MOUTH DAILY.     Gastroenterology: Proton Pump Inhibitors Passed - 06/25/2023  1:25 AM      Passed - Valid encounter within last 12 months    Recent Outpatient Visits           6 months ago Annual physical exam   [place] [facility] Lar[facility], NP   1 year ago Benign hypertension   [place] [facility], [facility]   1 year ago Aortic atherosclerosis [facility])   [place] [facility] Larae Grooms, NP   1 year ago Annual physical exam   [place] [facility] Lar[facility] G[facility], NP   1 year ago Current 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 episode [facility])    [place] [facility] Larae Grooms, NP

## 2023-07-01 ENCOUNTER — Other Ambulatory Visit: Payer: Self-pay | Admitting: Nurse Practitioner

## 2023-07-02 NOTE — Telephone Encounter (Signed)
Requested Prescriptions  Pending Prescriptions Disp Refills   levothyroxine (SYNTHROID) 150 MCG tablet [Pharmacy Med Name: LEVOTHYROXINE 150 MCG TABLET] 90 tablet 0    Sig: TAKE 1 TABLET BY MOUTH EVERY DAY     Endocrinology:  Hypothyroid Agents Failed - 07/01/2023  4:33 AM      Failed - TSH in normal range and within 360 days    TSH  Date Value Ref Range Status  12/03/2022 0.059 (L) 0.450 - 4.500 uIU/mL Final         Passed - Valid encounter within last 12 months    Recent Outpatient Visits           7 months ago Annual physical exam   [place] [facility] Lar[facility] Grooms, NP   1 year ago Benign hypertension   [place] [facility], NP   1 year ago Aortic atherosclerosis [facility])   [place] [facility] Larae Grooms, NP   1 year ago Annual physical exam   [place] [facility] Lar[facility] G[facility], NP   1 year ago Current 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 episode [facility])   [place] [facility] Larae Grooms, NP

## 2023-07-15 ENCOUNTER — Encounter: Payer: Self-pay | Admitting: Physician Assistant

## 2023-07-15 ENCOUNTER — Ambulatory Visit (INDEPENDENT_AMBULATORY_CARE_PROVIDER_SITE_OTHER): Payer: 59 | Admitting: Physician Assistant

## 2023-07-15 VITALS — BP 133/87 | HR 68 | Temp 97.8°F | Wt 283.8 lb

## 2023-07-15 DIAGNOSIS — R7301 Impaired fasting glucose: Secondary | ICD-10-CM | POA: Diagnosis not present

## 2023-07-15 DIAGNOSIS — I700 Atherosclerosis of aorta: Secondary | ICD-10-CM

## 2023-07-15 DIAGNOSIS — E038 Other specified hypothyroidism: Secondary | ICD-10-CM

## 2023-07-15 DIAGNOSIS — E782 Mixed hyperlipidemia: Secondary | ICD-10-CM

## 2023-07-15 DIAGNOSIS — F330 Major depressive disorder, recurrent, mild: Secondary | ICD-10-CM | POA: Diagnosis not present

## 2023-07-15 DIAGNOSIS — F17210 Nicotine dependence, cigarettes, uncomplicated: Secondary | ICD-10-CM

## 2023-07-15 DIAGNOSIS — I10 Essential (primary) hypertension: Secondary | ICD-10-CM | POA: Diagnosis not present

## 2023-07-15 MED ORDER — DICLOFENAC SODIUM 1 % EX GEL: 2.0000 g | Freq: Four times a day (QID) | CUTANEOUS | 2 refills | Status: AC

## 2023-07-15 MED ORDER — VENLAFAXINE HCL ER 150 MG PO CP24: 150.0000 mg | ORAL_CAPSULE | Freq: Every day | ORAL | 1 refills | Status: DC

## 2023-07-15 MED ORDER — BUSPIRONE HCL 5 MG PO TABS: 5.0000 mg | ORAL_TABLET | Freq: Two times a day (BID) | ORAL | 1 refills | Status: AC | PRN

## 2023-07-15 MED ORDER — ONDANSETRON 4 MG PO TBDP: ORAL_TABLET | ORAL | 2 refills | Status: DC

## 2023-07-15 MED ORDER — LEVOCETIRIZINE DIHYDROCHLORIDE 5 MG PO TABS: 5.0000 mg | ORAL_TABLET | Freq: Every evening | ORAL | 1 refills | Status: AC

## 2023-07-15 MED ORDER — BUPROPION HCL ER (XL) 300 MG PO TB24: 300.0000 mg | ORAL_TABLET | Freq: Every day | ORAL | 1 refills | Status: DC

## 2023-07-15 NOTE — Progress Notes (Unsigned)
Established Patient Office Visit  Name: Lynn French   MRN: [id number]    DOB: September 25, 1971   Date:07/16/2023  Today's Provider: Jacquelin Hawking, MHS, PA-C Introduced myself to the patient as a PA-C and provided education on APPs in clinical practice.         Subjective  Chief Complaint  Chief Complaint  Patient presents with   Hyperlipidemia   Hypertension   Diabetes    HPI  She reports she was out of some of her medications for about 5 days due to her insurance but thinks this has been sorted    HYPERTENSION / HYPERLIPIDEMIA Satisfied with current treatment? yes Duration of hypertension: chronic BP monitoring frequency: not checking BP range:  BP medication side effects: no  BP meds: none  Duration of hyperlipidemia: years Cholesterol medication side effects: no Cholesterol supplements: none Past cholesterol medications: rosuvastatin (crestor) Medication compliance: good compliance Aspirin: no Recent stressors: yes Recurrent headaches: no Visual changes: no Palpitations: no Dyspnea: no Chest pain: no Lower extremity edema: no Dizzy/lightheaded: no  Depression/ Anxiety  She reports her mood has gotten better since she got her medications  She does report some irritability - unsure if this was a concern prior to running out of Wellbutrin or after this occurred  She states she was off her Wellbutrin only and was able to continue taking her other medications    Hypothyroidism  She denies hair, skin, nail changes She does report some mood issues due to being out of her meds She denies vomiting, diarrhea She admits to intermittent constipation but this is relieved with stool softener She denies palpitations  She reports being diagnosed with abdominal hernia  She is having nausea  She states she has seen Surgery services and was told she would need to lose weight and stop smoking before the surgery could be attempted   Smoking She has reduced her  use She is smoking about half pack per day  She is planning to continue with decreasing - she is not sure if she wants to stop or if she will switch to a vape    Bladder leak/ urinary incontinence She reports she is taking Oxybutynin intermittently but does not feel that it is working very well She would like to proceed with Pelvic floor therapy at this time  Will provide information and phone number for PT services as referral to Pelvic floor therapy was already placed   Patient Active Problem List   Diagnosis Date Noted   Absolute anemia 02/06/2022   Incisional hernia, without obstruction or gangrene 02/21/2021   Aortic atherosclerosis (HCC) 10/27/2020   HSV (herpes simplex virus) infection 02/16/2020   History of non anemic vitamin B12 deficiency 10/02/2017   IFG (impaired fasting glucose) 04/07/2017   Benign hypertension 04/07/2017   Depression 05/19/2016   Recurrent boils 05/19/2016   GERD (gastroesophageal reflux disease) 05/19/2016   Hypothyroid    Anxiety    Nicotine dependence, cigarettes, uncomplicated    Hyperlipidemia    Morbid obesity (HCC)     Past Surgical History:  Procedure Laterality Date   APPENDECTOMY     BLADDER SURGERY     bladder stem enlarged   CESAREAN SECTION  11/17/11   CHOLECYSTECTOMY     TUBAL LIGATION  11/17/11   TYMPANOSTOMY TUBE PLACEMENT     WISDOM TOOTH EXTRACTION      Family History  Problem Relation Age of Onset   Breast cancer Mother  Mental illness Mother    Cancer Maternal Grandmother        breast   Allergies Son    Eczema Son     Social History   Tobacco Use   Smoking status: Every Day    Current packs/day: 0.50    Average packs/day: 0.5 packs/day for 20.0 years (10.0 ttl pk-yrs)    Types: Cigarettes   Smokeless tobacco: Never  Substance Use Topics   Alcohol use: No    Alcohol/week: 0.0 standard drinks of alcohol    Comment: very rare     Current Outpatient Medications:    acetaminophen (TYLENOL) 500 MG  tablet, Take 1,000 mg by mouth every 8 (eight) hours as needed., Disp: , Rfl:    bisacodyl (DULCOLAX) 5 MG EC tablet, Take 5 mg by mouth daily as needed for moderate constipation., Disp: , Rfl:    Cyanocobalamin (VITAMIN B-12) 5000 MCG SUBL, Place 5,000 mcg under the tongue daily., Disp: , Rfl:    levothyroxine (SYNTHROID) 150 MCG tablet, TAKE 1 TABLET BY MOUTH EVERY DAY, Disp: 90 tablet, Rfl: 0   omeprazole (PRILOSEC) 40 MG capsule, TAKE 1 CAPSULE (40 MG TOTAL) BY MOUTH DAILY., Disp: 90 capsule, Rfl: 0   oxybutynin (DITROPAN XL) 10 MG 24 hr tablet, Take 1 tablet (10 mg total) by mouth at bedtime., Disp: 90 tablet, Rfl: 1   rosuvastatin (CRESTOR) 10 MG tablet, TAKE 1 TABLET BY MOUTH EVERY DAY, Disp: 30 tablet, Rfl: 5   valACYclovir (VALTREX) 1000 MG tablet, TAKE 1 TABLET BY MOUTH EVERY DAY, Disp: 30 tablet, Rfl: 5   buPROPion (WELLBUTRIN XL) 300 MG 24 hr tablet, Take 1 tablet (300 mg total) by mouth daily., Disp: 90 tablet, Rfl: 1   busPIRone (BUSPAR) 5 MG tablet, Take 1 tablet (5 mg total) by mouth 2 (two) times daily as needed., Disp: 180 tablet, Rfl: 1   diclofenac Sodium (VOLTAREN) 1 % GEL, Apply 2 g topically 4 (four) times daily., Disp: 350 g, Rfl: 2   levocetirizine (XYZAL) 5 MG tablet, Take 1 tablet (5 mg total) by mouth every evening., Disp: 90 tablet, Rfl: 1   ondansetron (ZOFRAN-ODT) 4 MG disintegrating tablet, DISSOLVE 1 TABLET IN MOUTH EVERY 8 HOURS AS NEEDED FOR NAUSEA FOR VOMITING, Disp: 18 tablet, Rfl: 2   venlafaxine XR (EFFEXOR-XR) 150 MG 24 hr capsule, Take 1 capsule (150 mg total) by mouth daily with breakfast., Disp: 90 capsule, Rfl: 1  No Known Allergies  I personally reviewed active problem list, medication list, allergies, health maintenance, notes from last encounter, lab results with the patient/caregiver today.   Review of Systems  Eyes:  Negative for blurred vision, double vision and photophobia.  Respiratory:  Negative for shortness of breath and wheezing.    Cardiovascular:  Negative for chest pain, palpitations and leg swelling.  Gastrointestinal:  Positive for constipation and nausea. Negative for diarrhea and vomiting.  Neurological:  Negative for dizziness, tingling and headaches.  Psychiatric/Behavioral:  Positive for depression. Negative for memory loss and suicidal ideas. The patient is nervous/anxious.       Objective  Vitals:   07/15/23 0921  BP: 133/87  Pulse: 68  Temp: 97.8 F (36.6 C)  TempSrc: Oral  SpO2: 96%  Weight: 283 lb 12.8 oz (128.7 kg)    Body mass index is 43.79 kg/m.  Physical Exam Vitals reviewed.  Constitutional:      General: She is awake.     Appearance: Normal appearance. She is well-developed and well-groomed.  HENT:  Head: Normocephalic and atraumatic.  Cardiovascular:     Rate and Rhythm: Normal rate and regular rhythm.     Heart sounds: Normal heart sounds.  Pulmonary:     Effort: Pulmonary effort is normal.     Breath sounds: Normal breath sounds. No decreased air movement. No decreased breath sounds, wheezing, rhonchi or rales.  Musculoskeletal:     Cervical back: Normal range of motion.  Neurological:     Mental Status: She is alert.  Psychiatric:        Behavior: Behavior is cooperative.      Recent Results (from the past 2160 hour(s))  TSH     Status: Abnormal   Collection Time: 07/15/23  9:53 AM  Result Value Ref Range   TSH 0.058 (L) 0.450 - 4.500 uIU/mL  T4, free     Status: None   Collection Time: 07/15/23  9:53 AM  Result Value Ref Range   Free T4 1.38 0.82 - 1.77 ng/dL  Comp Met (CMET)     Status: None   Collection Time: 07/15/23  9:53 AM  Result Value Ref Range   Glucose 99 70 - 99 mg/dL   BUN 6 6 - 24 mg/dL   Creatinine, Ser 4.09 0.57 - 1.00 mg/dL   eGFR 811 >91 YN/WGN/5.62   BUN/Creatinine Ratio 9 9 - 23   Sodium 139 134 - 144 mmol/L   Potassium 4.5 3.5 - 5.2 mmol/L   Chloride 100 96 - 106 mmol/L   CO2 25 20 - 29 mmol/L   Calcium 8.8 8.7 - 10.2 mg/dL    Total Protein 6.5 6.0 - 8.5 g/dL   Albumin 3.9 3.9 - 4.9 g/dL   Globulin, Total 2.6 1.5 - 4.5 g/dL   Bilirubin Total <1.3 0.0 - 1.2 mg/dL   Alkaline Phosphatase 108 44 - 121 IU/L   AST 19 0 - 40 IU/L   ALT 11 0 - 32 IU/L  Lipid Profile     Status: None   Collection Time: 07/15/23  9:53 AM  Result Value Ref Range   Cholesterol, Total 158 100 - 199 mg/dL   Triglycerides 086 0 - 149 mg/dL   HDL 50 >57 mg/dL   VLDL Cholesterol Cal 22 5 - 40 mg/dL   LDL Chol Calc (NIH) 86 0 - 99 mg/dL   Chol/HDL Ratio 3.2 0.0 - 4.4 ratio    Comment:                                   T. Chol/HDL Ratio                                             Men  Women                               1/2 Avg.Risk  3.4    3.3                                   Avg.Risk  5.0    4.4  2X Avg.Risk  9.6    7.1                                3X Avg.Risk 23.4   11.0   CBC w/Diff     Status: None   Collection Time: 07/15/23  9:53 AM  Result Value Ref Range   WBC 7.3 3.4 - 10.8 x10E3/uL   RBC 4.43 3.77 - 5.28 x10E6/uL   Hemoglobin 13.0 11.1 - 15.9 g/dL   Hematocrit 13.0 86.5 - 46.6 %   MCV 92 79 - 97 fL   MCH 29.3 26.6 - 33.0 pg   MCHC 31.9 31.5 - 35.7 g/dL   RDW 78.4 69.6 - 29.5 %   Platelets 302 150 - 450 x10E3/uL   Neutrophils 59 Not Estab. %   Lymphs 30 Not Estab. %   Monocytes 6 Not Estab. %   Eos 4 Not Estab. %   Basos 1 Not Estab. %   Neutrophils Absolute 4.3 1.4 - 7.0 x10E3/uL   Lymphocytes Absolute 2.2 0.7 - 3.1 x10E3/uL   Monocytes Absolute 0.5 0.1 - 0.9 x10E3/uL   EOS (ABSOLUTE) 0.3 0.0 - 0.4 x10E3/uL   Basophils Absolute 0.0 0.0 - 0.2 x10E3/uL   Immature Granulocytes 0 Not Estab. %   Immature Grans (Abs) 0.0 0.0 - 0.1 x10E3/uL  HgB A1c     Status: Abnormal   Collection Time: 07/15/23  9:53 AM  Result Value Ref Range   Hgb A1c MFr Bld 6.3 (H) 4.8 - 5.6 %    Comment:          Prediabetes: 5.7 - 6.4          Diabetes: >6.4          Glycemic control for adults with diabetes:  <7.0    Est. average glucose Bld gHb Est-mCnc 134 mg/dL     MWU1/3:    2/44/0102    9:22 AM 12/03/2022   11:19 AM 05/14/2022    9:53 AM 02/06/2022    8:48 AM 10/23/2021    8:56 AM  Depression screen PHQ 2/9  Decreased Interest 1 2 2 1 1   Down, Depressed, Hopeless 1 1 0 1 1  PHQ - 2 Score 2 3 2 2 2   Altered sleeping 2 1 2 1 2   Tired, decreased energy 2 2 2 1 2   Change in appetite 2 2 1 2 1   Feeling bad or failure about yourself  1 0 0 1 1  Trouble concentrating 1 2 1 1 2   Moving slowly or fidgety/restless 0 0 0 0 0  Suicidal thoughts 1 0 0 0 0  PHQ-9 Score 11 10 8 8 10   Difficult doing work/chores Somewhat difficult Very difficult Not difficult at all Somewhat difficult Somewhat difficult       07/15/2023    9:22 AM 12/03/2022   11:20 AM 05/14/2022    9:54 AM 02/06/2022    8:49 AM  GAD 7 : Generalized Anxiety Score  Nervous, Anxious, on Edge 1 1 1 1   Control/stop worrying 1 1 1 1   Worry too much - different things 2 0 1 2  Trouble relaxing 0 0 0 1  Restless 2 1 1 1   Easily annoyed or irritable 3 1 2 3   Afraid - awful might happen 0 0 1 1  Total GAD 7 Score 9 4 7 10   Anxiety Difficulty Somewhat difficult Not difficult at all Not  difficult at all Somewhat difficult      Fall Risk:    07/15/2023    9:22 AM 12/03/2022   11:19 AM 05/14/2022    9:53 AM 02/06/2022    8:48 AM 10/23/2021    8:55 AM  Fall Risk   Falls in the past year? 1 1 0 0 0  Number falls in past yr: 1 0 0 0 0  Injury with Fall? 1 0 0 0 0  Risk for fall due to : History of fall(s) No Fall Risks No Fall Risks No Fall Risks No Fall Risks  Follow up Falls evaluation completed Falls evaluation completed Falls evaluation completed Falls evaluation completed Falls evaluation completed      Functional Status Survey:      Assessment & Plan  Problem List Items Addressed This Visit       Cardiovascular and Mediastinum   Benign hypertension - Primary    Chronic, controlled Appears to be doing well  without medications at this time  Recommend checking BP at home for monitoring  Follow up in 6 months or sooner if concerns arise        Relevant Orders   Comp Met (CMET) (Completed)   CBC w/Diff (Completed)   Aortic atherosclerosis (HCC)    Chronic,  appears controlled at this time  Currently taking Rosuvastatin 10 mg PO every day  Continue current regimen Recheck lipid panel today- results to dictate further management  Follow up in 6 months or sooner if concerns arise        Relevant Orders   Lipid Profile (Completed)     Endocrine   Hypothyroid    Chronic, appears controlled  She is currently taking Levothyroxine 150 mcg PO every day and appears to be tolerating well Recheck TSH and T4 today for monitoring-Results to dictate further management  Follow up in 6 months or sooner if concerns arise        Relevant Orders   TSH (Completed)   T4, free (Completed)   IFG (impaired fasting glucose)    Recheck A1c today- results to dictate further management       Relevant Orders   HgB A1c (Completed)     Other   Nicotine dependence, cigarettes, uncomplicated    Chronic, ongoing She is gradually decreasing consumption Reports she is down to about half pack per day and plans to continue weaning down  She is not sure if she wants to completely dc or if she will switch to vape at this time Currently taking Wellbutrin 300 mg PO every day and appears to be tolerating well Reviewed that she can reach out to office if she decides she want further help with cessation  Follow up in 6 months or sooner if concerns arise        Relevant Medications   buPROPion (WELLBUTRIN XL) 300 MG 24 hr tablet   Hyperlipidemia    Chronic,controlled Appears to be tolerating current regimen comprised of Rosuvastatin 10 mg PO every day Recheck lipid panel today- Results to dictate further management  Continue current regimen Follow up in 6 months or sooner if concerns arise        Relevant  Orders   Lipid Profile (Completed)   Depression    Chronic, historic condition Appears controlled on current regimen comprised of Venlafaxine 150 mg PO every day, Buspar 5 mg PO BID, and wellbutrin 300 mg PO every day  She reports some mild mood symptoms when she was unable to get  refills and had to go without her Wellbutrin for a few days but this seems to be resolving  Suspect this to be cause for increased PHQ9 and GAD7 scores  Continue current regimen Refills provided today  Follow up in 6 months or sooner if concerns arise        Relevant Medications   venlafaxine XR (EFFEXOR-XR) 150 MG 24 hr capsule   busPIRone (BUSPAR) 5 MG tablet   buPROPion (WELLBUTRIN XL) 300 MG 24 hr tablet     Return in about 6 months (around 01/12/2024) for HLD, HTN, Annual physical.   I, Porshea Janowski E Essense Bousquet, PA-C, have reviewed all documentation for this visit. The documentation on 07/16/23 for the exam, diagnosis, procedures, and orders are all accurate and complete.   Jacquelin Hawking, MHS, PA-C [facility]

## 2023-07-15 NOTE — Progress Notes (Deleted)
          Acute Office Visit   Patient: Lynn French   DOB: 11-10-1970   52 y.o. Female  MRN: [id number] Visit Date: 07/15/2023  Today's healthcare provider: Oswaldo Conroy Rafik Koppel, PA-C  Introduced myself to the patient as a Secondary school teacher and provided education on APPs in clinical practice.    No chief complaint on file.  Subjective    HPI    Medications: Outpatient Medications Prior to Visit  Medication Sig   acetaminophen (TYLENOL) 500 MG tablet Take 1,000 mg by mouth every 8 (eight) hours as needed.   bisacodyl (DULCOLAX) 5 MG EC tablet Take 5 mg by mouth daily as needed for moderate constipation.   buPROPion (WELLBUTRIN XL) 300 MG 24 hr tablet TAKE 1 TABLET BY MOUTH EVERY DAY   busPIRone (BUSPAR) 5 MG tablet Take 1 tablet (5 mg total) by mouth 2 (two) times daily as needed.   Cyanocobalamin (VITAMIN B-12) 5000 MCG SUBL Place 5,000 mcg under the tongue daily.   diclofenac Sodium (VOLTAREN) 1 % GEL Apply 2 g topically 4 (four) times daily.   levocetirizine (XYZAL) 5 MG tablet TAKE 1 TABLET BY MOUTH ONCE DAILY IN THE EVENING   levothyroxine (SYNTHROID) 150 MCG tablet TAKE 1 TABLET BY MOUTH EVERY DAY   mupirocin ointment (BACTROBAN) 2 % Apply 1 application. topically 2 (two) times daily.   omeprazole (PRILOSEC) 40 MG capsule TAKE 1 CAPSULE (40 MG TOTAL) BY MOUTH DAILY.   ondansetron (ZOFRAN-ODT) 4 MG disintegrating tablet DISSOLVE 1 TABLET IN MOUTH EVERY 8 HOURS AS NEEDED FOR NAUSEA FOR VOMITING   oxybutynin (DITROPAN XL) 10 MG 24 hr tablet Take 1 tablet (10 mg total) by mouth at bedtime.   rosuvastatin (CRESTOR) 10 MG tablet TAKE 1 TABLET BY MOUTH EVERY DAY   valACYclovir (VALTREX) 1000 MG tablet TAKE 1 TABLET BY MOUTH EVERY DAY   venlafaxine XR (EFFEXOR-XR) 150 MG 24 hr capsule TAKE 1 CAPSULE BY MOUTH DAILY WITH BREAKFAST.   No facility-administered medications prior to visit.    Review of Systems  {Insert previous labs (optional):23779} {See past labs  Heme  Chem  Endocrine   Serology  Results Review (optional):1}   Objective    There were no vitals taken for this visit. {Insert last BP/Wt (optional):23777}{See vitals history (optional):1}   Physical Exam    No results found for any visits on 07/15/23.  Assessment & Plan      No follow-ups on file.

## 2023-07-15 NOTE — Patient Instructions (Signed)
Please call the office below to schedule your pelvic floor physical therapy   [facility] [address] [id number] [place] [address] [phone]

## 2023-07-16 LAB — COMPREHENSIVE METABOLIC PANEL
ALT: 11 IU/L (ref 0–32)
AST: 19 IU/L (ref 0–40)
Albumin: 3.9 g/dL (ref 3.9–4.9)
Alkaline Phosphatase: 108 IU/L (ref 44–121)
BUN/Creatinine Ratio: 9 (ref 9–23)
BUN: 6 mg/dL (ref 6–24)
Bilirubin Total: <0.2 mg/dL (ref 0.0–1.2)
CO2: 25 mmol/L (ref 20–29)
Calcium: 8.8 mg/dL (ref 8.7–10.2)
Chloride: 100 mmol/L (ref 96–106)
Creatinine, Ser: 0.68 mg/dL (ref 0.57–1.00)
Globulin, Total: 2.6 g/dL (ref 1.5–4.5)
Glucose: 99 mg/dL (ref 70–99)
Potassium: 4.5 mmol/L (ref 3.5–5.2)
Sodium: 139 mmol/L (ref 134–144)
Total Protein: 6.5 g/dL (ref 6.0–8.5)
eGFR: 107 mL/min/{1.73_m2} (ref >59)

## 2023-07-16 LAB — LIPID PANEL
Chol/HDL Ratio: 3.2 ratio (ref 0.0–4.4)
Cholesterol, Total: 158 mg/dL (ref 100–199)
HDL: 50 mg/dL (ref >39)
LDL Chol Calc (NIH): 86 mg/dL (ref 0–99)
Triglycerides: 124 mg/dL (ref 0–149)
VLDL Cholesterol Cal: 22 mg/dL (ref 5–40)

## 2023-07-16 LAB — CBC WITH DIFFERENTIAL/PLATELET
Basophils Absolute: 0 10*3/uL (ref 0.0–0.2)
Basos: 1 %
EOS (ABSOLUTE): 0.3 10*3/uL (ref 0.0–0.4)
Eos: 4 %
Hematocrit: 40.7 % (ref 34.0–46.6)
Hemoglobin: 13 g/dL (ref 11.1–15.9)
Immature Grans (Abs): 0 10*3/uL (ref 0.0–0.1)
Immature Granulocytes: 0 %
Lymphocytes Absolute: 2.2 10*3/uL (ref 0.7–3.1)
Lymphs: 30 %
MCH: 29.3 pg (ref 26.6–33.0)
MCHC: 31.9 g/dL (ref 31.5–35.7)
MCV: 92 fL (ref 79–97)
Monocytes Absolute: 0.5 10*3/uL (ref 0.1–0.9)
Monocytes: 6 %
Neutrophils Absolute: 4.3 10*3/uL (ref 1.4–7.0)
Neutrophils: 59 %
Platelets: 302 10*3/uL (ref 150–450)
RBC: 4.43 x10E6/uL (ref 3.77–5.28)
RDW: 13.3 % (ref 11.7–15.4)
WBC: 7.3 10*3/uL (ref 3.4–10.8)

## 2023-07-16 LAB — TSH: TSH: 0.058 u[IU]/mL — ABNORMAL LOW (ref 0.450–4.500)

## 2023-07-16 LAB — HEMOGLOBIN A1C
Est. average glucose Bld gHb Est-mCnc: 134 mg/dL
Hgb A1c MFr Bld: 6.3 % — ABNORMAL HIGH (ref 4.8–5.6)

## 2023-07-16 LAB — T4, FREE: Free T4: 1.38 ng/dL (ref 0.82–1.77)

## 2023-07-16 NOTE — Assessment & Plan Note (Signed)
Chronic,controlled Appears to be tolerating current regimen comprised of Rosuvastatin 10 mg PO every day Recheck lipid panel today- Results to dictate further management  Continue current regimen Follow up in 6 months or sooner if concerns arise

## 2023-07-16 NOTE — Assessment & Plan Note (Signed)
Chronic, controlled Appears to be doing well without medications at this time  Recommend checking BP at home for monitoring  Follow up in 6 months or sooner if concerns arise

## 2023-07-16 NOTE — Assessment & Plan Note (Signed)
 Recheck A1c today-results to dictate further management

## 2023-07-16 NOTE — Assessment & Plan Note (Signed)
Chronic, ongoing She is gradually decreasing consumption Reports she is down to about half pack per day and plans to continue weaning down  She is not sure if she wants to completely dc or if she will switch to vape at this time Currently taking Wellbutrin 300 mg PO every day and appears to be tolerating well Reviewed that she can reach out to office if she decides she want further help with cessation  Follow up in 6 months or sooner if concerns arise

## 2023-07-16 NOTE — Assessment & Plan Note (Addendum)
Chronic, historic condition Appears controlled on current regimen comprised of Venlafaxine 150 mg PO every day, Buspar 5 mg PO BID, and wellbutrin 300 mg PO every day  She reports some mild mood symptoms when she was unable to get refills and had to go without her Wellbutrin for a few days but this seems to be resolving  Suspect this to be cause for increased PHQ9 and GAD7 scores  Continue current regimen Refills provided today  Follow up in 6 months or sooner if concerns arise

## 2023-07-16 NOTE — Assessment & Plan Note (Signed)
Chronic, appears controlled  She is currently taking Levothyroxine 150 mcg PO every day and appears to be tolerating well Recheck TSH and T4 today for monitoring-Results to dictate further management  Follow up in 6 months or sooner if concerns arise

## 2023-07-16 NOTE — Assessment & Plan Note (Signed)
Chronic,  appears controlled at this time  Currently taking Rosuvastatin 10 mg PO every day  Continue current regimen Recheck lipid panel today- results to dictate further management  Follow up in 6 months or sooner if concerns arise

## 2023-07-17 NOTE — Progress Notes (Signed)
Your labs are back Your thyroid testing appears stable- Please continue with your current medications as directed Your A1c was stable at 6.3- this is in the prediabetic range. Medications are not indicated at this time but I do recommend exercising regularly and avoiding excess sugars and carbohydrates to prevent further progression Your electrolytes, liver and kidney function were overall normal at this time Your cholesterol looks good  Your CBC was normal and stable Please continue your current medication regimen and make sure to follow up as discussed.

## 2023-07-22 ENCOUNTER — Other Ambulatory Visit: Payer: Self-pay | Admitting: Nurse Practitioner

## 2023-07-23 NOTE — Telephone Encounter (Signed)
Duplicate request, request is too soon for review.  Requested Prescriptions  Pending Prescriptions Disp Refills   omeprazole (PRILOSEC) 40 MG capsule [Pharmacy Med Name: OMEPRAZOLE DR 40 MG CAPSULE] 90 capsule 1    Sig: TAKE 1 CAPSULE (40 MG TOTAL) BY MOUTH DAILY.     Gastroenterology: Proton Pump Inhibitors Passed - 07/22/2023  1:31 PM      Passed - Valid encounter within last 12 months    Recent Outpatient Visits           1 week ago Benign hypertension   [place] [facility] M[place]um, Oswaldo Conroy, PA-C   7 months ago Annual physical exam   [place] [facility] Larae Grooms, NP   1 year ago Benign hypertension   [place] [facility] Larae Grooms, NP   1 year ago Aortic atherosclerosis [facility])   [place] [facility] Larae Grooms, NP   1 year ago Annual physical exam   [place] [facility] Larae Grooms, NP       Future Appointments             In 5 months Larae Grooms, NP [facility]

## 2023-08-19 ENCOUNTER — Ambulatory Visit (INDEPENDENT_AMBULATORY_CARE_PROVIDER_SITE_OTHER): Payer: 59 | Admitting: Podiatry

## 2023-08-19 ENCOUNTER — Ambulatory Visit (INDEPENDENT_AMBULATORY_CARE_PROVIDER_SITE_OTHER): Payer: 59

## 2023-08-19 ENCOUNTER — Encounter: Payer: Self-pay | Admitting: Podiatry

## 2023-08-19 DIAGNOSIS — D2371 Other benign neoplasm of skin of right lower limb, including hip: Secondary | ICD-10-CM | POA: Diagnosis not present

## 2023-08-19 DIAGNOSIS — M2041 Other hammer toe(s) (acquired), right foot: Secondary | ICD-10-CM

## 2023-08-19 DIAGNOSIS — D2372 Other benign neoplasm of skin of left lower limb, including hip: Secondary | ICD-10-CM | POA: Diagnosis not present

## 2023-08-19 NOTE — Progress Notes (Signed)
Subjective:  Patient ID: Lynn French, female    DOB: [birth year],  MRN: [id number] HPI Chief Complaint  Patient presents with   Toe Pain    5th toe right - callused area lateral border of nail, tender with shoes   Callouses    Trim calluses bilateral plantar forefoot   New Patient (Initial Visit)    Est pt 59    52 y.o. female presents with the above complaint.   ROS: Denies fever chills nausea vomit muscle aches pains calf pain back pain chest pain shortness of breath.  Past Medical History:  Diagnosis Date   Abdominal hernia    Acid reflux    Anemia    Anxiety    Atypical squamous cells of undetermined significance (ASCUS) on Papanicolaou smear of cervix April 2016   HPV Negative, next pap due April 2017   B12 deficiency 2000s   history of B12 deficiency   Diabetes mellitus without complication (HCC)    Hyperlipidemia    Hypothyroidism    Obesity    Tachycardia    with palpitations, negative work up.   Tobacco use    Past Surgical History:  Procedure Laterality Date   APPENDECTOMY     BLADDER SURGERY     bladder stem enlarged   CESAREAN SECTION  11/17/11   CHOLECYSTECTOMY     TUBAL LIGATION  11/17/11   TYMPANOSTOMY TUBE PLACEMENT     WISDOM TOOTH EXTRACTION      Current Outpatient Medications:    acetaminophen (TYLENOL) 500 MG tablet, Take 1,000 mg by mouth every 8 (eight) hours as needed., Disp: , Rfl:    bisacodyl (DULCOLAX) 5 MG EC tablet, Take 5 mg by mouth daily as needed for moderate constipation., Disp: , Rfl:    buPROPion (WELLBUTRIN XL) 300 MG 24 hr tablet, Take 1 tablet (300 mg total) by mouth daily., Disp: 90 tablet, Rfl: 1   busPIRone (BUSPAR) 5 MG tablet, Take 1 tablet (5 mg total) by mouth 2 (two) times daily as needed., Disp: 180 tablet, Rfl: 1   Cyanocobalamin (VITAMIN B-12) 5000 MCG SUBL, Place 5,000 mcg under the tongue daily., Disp: , Rfl:    diclofenac Sodium (VOLTAREN) 1 % GEL, Apply 2 g topically 4 (four) times daily., Disp: 350 g, Rfl:  2   levocetirizine (XYZAL) 5 MG tablet, Take 1 tablet (5 mg total) by mouth every evening., Disp: 90 tablet, Rfl: 1   levothyroxine (SYNTHROID) 150 MCG tablet, TAKE 1 TABLET BY MOUTH EVERY DAY, Disp: 90 tablet, Rfl: 0   omeprazole (PRILOSEC) 40 MG capsule, TAKE 1 CAPSULE (40 MG TOTAL) BY MOUTH DAILY., Disp: 90 capsule, Rfl: 0   ondansetron (ZOFRAN-ODT) 4 MG disintegrating tablet, DISSOLVE 1 TABLET IN MOUTH EVERY 8 HOURS AS NEEDED FOR NAUSEA FOR VOMITING, Disp: 18 tablet, Rfl: 2   oxybutynin (DITROPAN XL) 10 MG 24 hr tablet, Take 1 tablet (10 mg total) by mouth at bedtime., Disp: 90 tablet, Rfl: 1   rosuvastatin (CRESTOR) 10 MG tablet, TAKE 1 TABLET BY MOUTH EVERY DAY, Disp: 30 tablet, Rfl: 5   valACYclovir (VALTREX) 1000 MG tablet, TAKE 1 TABLET BY MOUTH EVERY DAY, Disp: 30 tablet, Rfl: 5   venlafaxine XR (EFFEXOR-XR) 150 MG 24 hr capsule, Take 1 capsule (150 mg total) by mouth daily with breakfast., Disp: 90 capsule, Rfl: 1  No Known Allergies Review of Systems Objective:  There were no vitals filed for this visit.  General: Well developed, nourished, in no acute distress, alert and oriented  x3   Dermatological: Skin is warm, dry and supple bilateral. Nails x 10 are well maintained; remaining integument appears unremarkable at this time. There are no open sores, no preulcerative lesions, no rash or signs of infection present.  Vascular: Dorsalis Pedis artery and Posterior Tibial artery pedal pulses are 2/4 bilateral with immedate capillary fill time. Pedal hair growth present. No varicosities and no lower extremity edema present bilateral.   Neruologic: Grossly intact via light touch bilateral. Vibratory intact via tuning fork bilateral. Protective threshold with Semmes Wienstein monofilament intact to all pedal sites bilateral. Patellar and Achilles deep tendon reflexes 2+ bilateral. No Babinski or clonus noted bilateral.   Musculoskeletal: No gross boney pedal deformities bilateral. No  pain, crepitus, or limitation noted with foot and ankle range of motion bilateral. Muscular strength 5/5 in all groups tested bilateral.  Gait: Unassisted, Nonantalgic.    Radiographs:  None taken  Assessment & Plan:   Assessment: Painful calluses plantar aspect of the forefoot bilateral particularly plantar lateral aspect of the fifth metatarsals.  She also has a painful callus subinterphalangeal joint hallux left and a painful listers corn fifth digit right.  Plan: Debrided all reactive hyperkeratotic tissue.     Kemonie Cutillo T. [place], [place]

## 2023-09-20 ENCOUNTER — Other Ambulatory Visit: Payer: Self-pay | Admitting: Physician Assistant

## 2023-09-20 DIAGNOSIS — F330 Major depressive disorder, recurrent, mild: Secondary | ICD-10-CM

## 2023-09-20 DIAGNOSIS — F17210 Nicotine dependence, cigarettes, uncomplicated: Secondary | ICD-10-CM

## 2023-09-21 NOTE — Telephone Encounter (Signed)
Reordered 07/15/23 #90 1 RF  Requested Prescriptions  Refused Prescriptions Disp Refills   buPROPion (WELLBUTRIN XL) 300 MG 24 hr tablet [Pharmacy Med Name: BUPROPION HCL XL 300 MG TABLET] 90 tablet 2    Sig: TAKE 1 TABLET BY MOUTH EVERY DAY     Psychiatry: Antidepressants - bupropion Passed - 09/20/2023  8:32 AM      Passed - Cr in normal range and within 360 days    Creatinine  Date Value Ref Range Status  12/15/2012 0.66 0.60 - 1.30 mg/dL Final   Creatinine, Ser  Date Value Ref Range Status  07/15/2023 0.68 0.57 - 1.00 mg/dL Final         Passed - AST in normal range and within 360 days    AST  Date Value Ref Range Status  07/15/2023 19 0 - 40 IU/L Final   SGOT(AST)  Date Value Ref Range Status  12/15/2012 20 15 - 37 Unit/L Final         Passed - ALT in normal range and within 360 days    ALT  Date Value Ref Range Status  07/15/2023 11 0 - 32 IU/L Final   SGPT (ALT)  Date Value Ref Range Status  12/15/2012 22 12 - 78 U/L Final         Passed - Completed PHQ-2 or PHQ-9 in the last 360 days      Passed - Last BP in normal range    BP Readings from Last 1 Encounters:  07/15/23 133/87         Passed - Valid encounter within last 6 months    Recent Outpatient Visits           2 months ago Benign hypertension   [place] [facility] Mecum, Oswaldo Conroy, PA-C   9 months ago Annual physical exam   [place] [facility] Larae Grooms, NP   1 year ago Benign hypertension   [place] [facility] Larae Grooms, NP   1 year ago Aortic atherosclerosis [facility])   [place] [facility] Larae Grooms, NP   1 year ago Annual physical exam   [place] [facility] Larae Grooms, NP       Future Appointments             In 3 months Larae Grooms, NP [place] [facility]

## 2023-09-24 ENCOUNTER — Other Ambulatory Visit: Payer: Self-pay | Admitting: Nurse Practitioner

## 2023-09-25 NOTE — Telephone Encounter (Signed)
Requested Prescriptions  Pending Prescriptions Disp Refills   omeprazole (PRILOSEC) 40 MG capsule [Pharmacy Med Name: OMEPRAZOLE DR 40 MG CAPSULE] 30 capsule 2    Sig: TAKE 1 CAPSULE (40 MG TOTAL) BY MOUTH DAILY.     Gastroenterology: Proton Pump Inhibitors Passed - 09/24/2023  1:32 AM      Passed - Valid encounter within last 12 months    Recent Outpatient Visits           2 months ago Benign hypertension   [place] [facility] Mecum, Oswaldo Conroy, PA-C   9 months ago Annual physical exam   [place] [facility] Larae Grooms, NP   1 year ago Benign hypertension   [place] [facility] Larae Grooms, NP   1 year ago Aortic atherosclerosis [facility])   [place] [facility] Larae Grooms, NP   1 year ago Annual physical exam   [place] [facility] Larae Grooms, NP       Future Appointments             In 3 months Larae Grooms, NP [place] [facility]             levothyroxine (SYNTHROID) 150 MCG tablet [Pharmacy Med Name: LEVOTHYROXINE 150 MCG TABLET] 30 tablet 2    Sig: TAKE 1 TABLET BY MOUTH EVERY DAY     Endocrinology:  Hypothyroid Agents Failed - 09/24/2023  1:32 AM      Failed - TSH in normal range and within 360 days    TSH  Date Value Ref Range Status  07/15/2023 0.058 (L) 0.450 - 4.500 uIU/mL Final         Passed - Valid encounter within last 12 months    Recent Outpatient Visits           2 months ago Benign hypertension   [place] [facility] Mecum, Oswaldo Conroy, PA-C   9 months ago Annual physical exam   [place] [facility] Larae Grooms, NP   1 year ago Benign hypertension   [place] [facility] Larae Grooms, NP   1 year ago Aortic atherosclerosis [facility])   [place] [facility] Larae Grooms, NP   1 year ago Annual physical exam   [place] [facility]  Larae Grooms, NP       Future Appointments             In 3 months Larae Grooms, NP [place] [facility], [facility]

## 2023-12-23 ENCOUNTER — Other Ambulatory Visit: Payer: Self-pay | Admitting: Nurse Practitioner

## 2023-12-24 NOTE — Telephone Encounter (Signed)
Requested Prescriptions  Pending Prescriptions Disp Refills   levothyroxine (SYNTHROID) 150 MCG tablet [Pharmacy Med Name: LEVOTHYROXINE 150 MCG TABLET] 90 tablet 0    Sig: TAKE 1 TABLET BY MOUTH EVERY DAY     Endocrinology:  Hypothyroid Agents Failed - 12/24/2023 10:59 AM      Failed - TSH in normal range and within 360 days    TSH  Date Value Ref Range Status  07/15/2023 0.058 (L) 0.450 - 4.500 uIU/mL Final         Passed - Valid encounter within last 12 months    Recent Outpatient Visits           5 months ago Benign hypertension   [place] [facility] Mecum, Oswaldo Conroy, PA-C   1 year ago Annual physical exam   [place] [facility] Larae Grooms, NP   1 year ago Benign hypertension   [place] [facility] Larae Grooms, NP   1 year ago Aortic atherosclerosis [facility])   [place] [facility] Larae Grooms, NP   2 years ago Annual physical exam   [place] [facility] Larae Grooms, NP       Future Appointments             In 2 weeks Larae Grooms, NP [place] [facility], [facility]

## 2023-12-27 ENCOUNTER — Other Ambulatory Visit: Payer: Self-pay | Admitting: Nurse Practitioner

## 2023-12-29 NOTE — Telephone Encounter (Signed)
 Requested Prescriptions  Pending Prescriptions Disp Refills   valACYclovir (VALTREX) 1000 MG tablet [Pharmacy Med Name: VALACYCLOVIR HCL 1 GRAM TABLET] 90 tablet 0    Sig: TAKE 1 TABLET BY MOUTH EVERY DAY     Antimicrobials:  Antiviral Agents - Anti-Herpetic Passed - 12/29/2023  9:15 AM      Passed - Valid encounter within last 12 months    Recent Outpatient Visits           5 months ago Benign hypertension   [place] [facility] Mecum, Oswaldo Conroy, PA-C   1 year ago Annual physical exam   [place] [facility] Larae Grooms, NP   1 year ago Benign hypertension   [place] [facility], NP   1 year ago Aortic atherosclerosis [facility])   [place] [facility] Larae Grooms, NP   2 years ago Annual physical exam   [place] [facility] Larae Grooms, NP       Future Appointments             In 2 weeks Larae Grooms, NP [place] [facility]             rosuvastatin (CRESTOR) 10 MG tablet [Pharmacy Med Name: ROSUVASTATIN CALCIUM 10 MG TAB] 90 tablet 0    Sig: TAKE 1 TABLET BY MOUTH EVERY DAY     Cardiovascular:  Antilipid - Statins 2 Failed - 12/29/2023  9:15 AM      Failed - Lipid Panel in normal range within the last 12 months    Cholesterol, Total  Date Value Ref Range Status  07/15/2023 158 100 - 199 mg/dL Final   LDL Chol Calc (NIH)  Date Value Ref Range Status  07/15/2023 86 0 - 99 mg/dL Final   HDL  Date Value Ref Range Status  07/15/2023 50 >39 mg/dL Final   Triglycerides  Date Value Ref Range Status  07/15/2023 124 0 - 149 mg/dL Final         Passed - Cr in normal range and within 360 days    Creatinine  Date Value Ref Range Status  12/15/2012 0.66 0.60 - 1.30 mg/dL Final   Creatinine, Ser  Date Value Ref Range Status  07/15/2023 0.68 0.57 - 1.00 mg/dL Final         Passed - Patient is not pregnant      Passed - Valid  encounter within last 12 months    Recent Outpatient Visits           5 months ago Benign hypertension   [place] [facility] Mecum, Oswaldo Conroy, PA-C   1 year ago Annual physical exam   [place] [facility] Larae Grooms, NP   1 year ago Benign hypertension   [place] [facility] Larae Grooms, NP   1 year ago Aortic atherosclerosis [facility])   [place] [facility] Lar[facility] G[facility], NP   2 years ago Annual physical exam   [place] [facility] Larae Grooms, NP       Future Appointments             In 2 weeks Larae Grooms, NP [place] [facility]             omeprazole (PRILOSEC) 40 MG capsule [Pharmacy Med Name: OMEPRAZOLE DR 40 MG CAPSULE] 90 capsule 0    Sig: TAKE 1 CAPSULE (40 MG TOTAL) BY MOUTH  DAILY.     Gastroenterology: Proton Pump Inhibitors Passed - 12/29/2023  9:15 AM      Passed - Valid encounter within last 12 months    Recent Outpatient Visits           5 months ago Benign hypertension   [place] [facility] Mecum, Oswaldo Conroy, PA-C   1 year ago Annual physical exam   [place] [facility] Larae Grooms, NP   1 year ago Benign hypertension   [place] [facility] Lar[facility] Grooms, NP   1 year ago Aortic atherosclerosis [facility])   [place] [facility] Larae Grooms, NP   2 years ago Annual physical exam   [place] [facility] Larae Grooms, NP       Future Appointments             In 2 weeks Larae Grooms, NP  [place] [facility]

## 2023-12-31 ENCOUNTER — Other Ambulatory Visit: Payer: Self-pay | Admitting: Nurse Practitioner

## 2023-12-31 DIAGNOSIS — N393 Stress incontinence (female) (male): Secondary | ICD-10-CM

## 2024-01-01 NOTE — Telephone Encounter (Signed)
 Requested Prescriptions  Pending Prescriptions Disp Refills   oxybutynin (DITROPAN-XL) 10 MG 24 hr tablet [Pharmacy Med Name: OXYBUTYNIN CL ER 10 MG TABLET] 90 tablet 0    Sig: TAKE 1 TABLET BY MOUTH EVERYDAY AT BEDTIME     Urology:  Bladder Agents Passed - 01/01/2024  1:19 PM      Passed - Valid encounter within last 12 months    Recent Outpatient Visits           5 months ago Benign hypertension   [place] [facility] Mecum, Oswaldo Conroy, PA-C   1 year ago Annual physical exam   [place] [facility] Larae Grooms, NP   1 year ago Benign hypertension   [facility], NP   1 year ago Aortic atherosclerosis [facility])   [place] [facility] Larae Grooms, NP   2 years ago Annual physical exam   [place] [facility] Larae Grooms, NP       Future Appointments             In 1 week Larae Grooms, NP  [facility]

## 2024-01-12 ENCOUNTER — Encounter: Payer: Self-pay | Admitting: Nurse Practitioner

## 2024-01-12 ENCOUNTER — Ambulatory Visit (INDEPENDENT_AMBULATORY_CARE_PROVIDER_SITE_OTHER): Payer: 59 | Admitting: Nurse Practitioner

## 2024-01-12 VITALS — BP 107/70 | HR 70 | Ht 67.5 in | Wt 284.6 lb

## 2024-01-12 DIAGNOSIS — I700 Atherosclerosis of aorta: Secondary | ICD-10-CM

## 2024-01-12 DIAGNOSIS — Z1231 Encounter for screening mammogram for malignant neoplasm of breast: Secondary | ICD-10-CM

## 2024-01-12 DIAGNOSIS — R7301 Impaired fasting glucose: Secondary | ICD-10-CM

## 2024-01-12 DIAGNOSIS — E782 Mixed hyperlipidemia: Secondary | ICD-10-CM

## 2024-01-12 DIAGNOSIS — Z0000 Encounter for general adult medical examination without abnormal findings: Secondary | ICD-10-CM | POA: Diagnosis not present

## 2024-01-12 DIAGNOSIS — F320 Major depressive disorder, single episode, mild: Secondary | ICD-10-CM | POA: Diagnosis not present

## 2024-01-12 DIAGNOSIS — F419 Anxiety disorder, unspecified: Secondary | ICD-10-CM

## 2024-01-12 DIAGNOSIS — E038 Other specified hypothyroidism: Secondary | ICD-10-CM

## 2024-01-12 LAB — URINALYSIS, ROUTINE W REFLEX MICROSCOPIC
Bilirubin, UA: NEGATIVE
Glucose, UA: NEGATIVE
Ketones, UA: NEGATIVE
Leukocytes,UA: NEGATIVE
Nitrite, UA: NEGATIVE
Protein,UA: NEGATIVE
RBC, UA: NEGATIVE
Specific Gravity, UA: 1.015 (ref 1.005–1.030)
Urobilinogen, Ur: 0.2 mg/dL (ref 0.2–1.0)
pH, UA: 6 (ref 5.0–7.5)

## 2024-01-12 NOTE — Assessment & Plan Note (Signed)
 Labs ordered at visit today.  Will make recommendations based on lab results.

## 2024-01-12 NOTE — Assessment & Plan Note (Signed)
 Chronic.  Controlled.  Continue with current medication regimen of Effexor and Wellbutrin.  Uses Buspar PRN.  Labs ordered today.  Return to clinic in 6 months for reevaluation.  Call sooner if concerns arise.

## 2024-01-12 NOTE — Assessment & Plan Note (Signed)
Chronic.  Controlled.  Continue with current medication regimen on Crestor 10mg daily.  Labs ordered today.  Return to clinic in 6 months for reevaluation.  Call sooner if concerns arise.  ? ?

## 2024-01-12 NOTE — Assessment & Plan Note (Signed)
 Chronic, controlled.  She is currently taking Levothyroxine 150 mcg PO every day and appears to be tolerating well Labs ordered at visit today.  Will adjust medications if needed. Follow up in 6 months or sooner if concerns arise

## 2024-01-12 NOTE — Assessment & Plan Note (Signed)
Chronic,controlled Appears to be tolerating current regimen comprised of Rosuvastatin 10 mg PO every day Recheck lipid panel today- Results to dictate further management  Continue current regimen Follow up in 6 months or sooner if concerns arise

## 2024-01-12 NOTE — Assessment & Plan Note (Signed)
 Recommended eating smaller high protein, low fat meals more frequently and exercising 30 mins a day 5 times a week with a goal of 10-15lb weight loss in the next 3 months.

## 2024-01-12 NOTE — Progress Notes (Signed)
 BP 107/70 (BP Location: Right Arm, Patient Position: Sitting, Cuff Size: Large)   Pulse 70   Ht 5' 7.5" (1.715 m)   Wt 284 lb 9.6 oz (129.1 kg)   SpO2 95%   BMI 43.92 kg/m    Subjective:    Patient ID: Lynn French, female    DOB: February 04, 1971, 53 y.o.   MRN: [id number]  HPI: Lynn French is a 53 y.o. female presenting on 01/12/2024 for comprehensive medical examination. Current medical complaints include:none  She currently lives with: Menopausal Symptoms: no  HYPERTENSION / HYPERLIPIDEMIA Satisfied with current treatment? no Duration of hypertension: years BP monitoring frequency: not checking BP range:  BP medication side effects: no Past BP meds: none Duration of hyperlipidemia: years Cholesterol medication side effects: no Cholesterol supplements: none Past cholesterol medications: rosuvastatin (crestor) Medication compliance: excellent compliance Aspirin: no Recent stressors: no Recurrent headaches: no Visual changes: no Palpitations: no Dyspnea: no Chest pain: no Lower extremity edema: no Dizzy/lightheaded: no  HYPOTHYROIDISM Thyroid control status:controlled Satisfied with current treatment? no Medication side effects: no Medication compliance: excellent compliance Etiology of hypothyroidism:  Recent dose adjustment:no Fatigue: getting better Cold intolerance: no Heat intolerance: no Weight gain: no Weight loss: no Constipation: yes Diarrhea/loose stools: no Palpitations: no Lower extremity edema: no Anxiety/depressed mood: yes    DEPRESSION/ANXIETY Patient states she feels like she has been doing okay.  Overall she feels like she is doing pretty well.  She does have some times where she is down.  On Wellbutrin, Effexor and Buspar PRN.    Depression Screen done today and results listed below:     01/12/2024    9:05 AM 07/15/2023    9:22 AM 12/03/2022   11:19 AM 05/14/2022    9:53 AM 02/06/2022    8:48 AM  Depression screen PHQ 2/9   Decreased Interest 3 1 2 2 1   Down, Depressed, Hopeless 0 1 1 0 1  PHQ - 2 Score 3 2 3 2 2   Altered sleeping 3 2 1 2 1   Tired, decreased energy 3 2 2 2 1   Change in appetite 1 2 2 1 2   Feeling bad or failure about yourself  1 1 0 0 1  Trouble concentrating 1 1 2 1 1   Moving slowly or fidgety/restless 0 0 0 0 0  Suicidal thoughts 1 1 0 0 0  PHQ-9 Score 13 11 10 8 8   Difficult doing work/chores  Somewhat difficult Very difficult Not difficult at all Somewhat difficult    The patient did a history of falls. I did complete a risk assessment for falls. A plan of care for falls was documented.   Past Medical History:  Past Medical History:  Diagnosis Date   Abdominal hernia    Acid reflux    Anemia    Anxiety    Atypical squamous cells of undetermined significance (ASCUS) on Papanicolaou smear of cervix April 2016   HPV Negative, next pap due April 2017   B12 deficiency 2000s   history of B12 deficiency   Diabetes mellitus without complication (HCC)    Hyperlipidemia    Hypothyroidism    Obesity    Tachycardia    with palpitations, negative work up.   Tobacco use     Surgical History:  Past Surgical History:  Procedure Laterality Date   APPENDECTOMY     BLADDER SURGERY     bladder stem enlarged   CESAREAN SECTION  11/17/11   CHOLECYSTECTOMY  TUBAL LIGATION  11/17/11   TYMPANOSTOMY TUBE PLACEMENT     WISDOM TOOTH EXTRACTION      Medications:  Current Outpatient Medications on File Prior to Visit  Medication Sig   acetaminophen (TYLENOL) 500 MG tablet Take 1,000 mg by mouth every 8 (eight) hours as needed.   bisacodyl (DULCOLAX) 5 MG EC tablet Take 5 mg by mouth daily as needed for moderate constipation.   buPROPion (WELLBUTRIN XL) 300 MG 24 hr tablet Take 1 tablet (300 mg total) by mouth daily.   busPIRone (BUSPAR) 5 MG tablet Take 1 tablet (5 mg total) by mouth 2 (two) times daily as needed.   Cyanocobalamin (VITAMIN B-12) 5000 MCG SUBL Place 5,000 mcg under the  tongue daily.   diclofenac Sodium (VOLTAREN) 1 % GEL Apply 2 g topically 4 (four) times daily.   levocetirizine (XYZAL) 5 MG tablet Take 1 tablet (5 mg total) by mouth every evening.   levothyroxine (SYNTHROID) 150 MCG tablet TAKE 1 TABLET BY MOUTH EVERY DAY   omeprazole (PRILOSEC) 40 MG capsule TAKE 1 CAPSULE (40 MG TOTAL) BY MOUTH DAILY.   ondansetron (ZOFRAN-ODT) 4 MG disintegrating tablet DISSOLVE 1 TABLET IN MOUTH EVERY 8 HOURS AS NEEDED FOR NAUSEA FOR VOMITING   rosuvastatin (CRESTOR) 10 MG tablet TAKE 1 TABLET BY MOUTH EVERY DAY   valACYclovir (VALTREX) 1000 MG tablet TAKE 1 TABLET BY MOUTH EVERY DAY   venlafaxine XR (EFFEXOR-XR) 150 MG 24 hr capsule Take 1 capsule (150 mg total) by mouth daily with breakfast.   No current facility-administered medications on file prior to visit.    Allergies:  No Known Allergies  Social History:  Social History   Socioeconomic History   Marital status: Single    Spouse name: Not on file   Number of children: Not on file   Years of education: Not on file   Highest education level: Not on file  Occupational History   Not on file  Tobacco Use   Smoking status: Every Day    Current packs/day: 0.50    Average packs/day: 0.5 packs/day for 20.0 years (10.0 ttl pk-yrs)    Types: Cigarettes   Smokeless tobacco: Never  Vaping Use   Vaping status: Some Days   Devices: Rare Use  Substance and Sexual Activity   Alcohol use: No    Alcohol/week: 0.0 standard drinks of alcohol    Comment: very rare   Drug use: No   Sexual activity: Not Currently  Other Topics Concern   Not on file  Social History Narrative   Not on file   Social Drivers of Health   Financial Resource Strain: Not on file  Food Insecurity: Not on file  Transportation Needs: Not on file  Physical Activity: Not on file  Stress: Not on file  Social Connections: Not on file  Intimate Partner Violence: Not on file   Social History   Tobacco Use  Smoking Status Every Day    Current packs/day: 0.50   Average packs/day: 0.5 packs/day for 20.0 years (10.0 ttl pk-yrs)   Types: Cigarettes  Smokeless Tobacco Never   Social History   Substance and Sexual Activity  Alcohol Use No   Alcohol/week: 0.0 standard drinks of alcohol   Comment: very rare    Family History:  Family History  Problem Relation Age of Onset   Breast cancer Mother    Mental illness Mother    Cancer Maternal Grandmother        breast   Allergies Son  Eczema Son     Past medical history, surgical history, medications, allergies, family history and social history reviewed with patient today and changes made to appropriate areas of the chart.   Review of Systems  Eyes:  Negative for blurred vision and double vision.  Respiratory:  Negative for shortness of breath.   Cardiovascular:  Negative for chest pain, palpitations and leg swelling.  Neurological:  Negative for dizziness and headaches.  Psychiatric/Behavioral:  Positive for depression. Negative for suicidal ideas. The patient is nervous/anxious.    All other ROS negative except what is listed above and in the HPI.      Objective:    BP 107/70 (BP Location: Right Arm, Patient Position: Sitting, Cuff Size: Large)   Pulse 70   Ht 5' 7.5" (1.715 m)   Wt 284 lb 9.6 oz (129.1 kg)   SpO2 95%   BMI 43.92 kg/m   Wt Readings from Last 3 Encounters:  01/12/24 284 lb 9.6 oz (129.1 kg)  07/15/23 283 lb 12.8 oz (128.7 kg)  12/03/22 282 lb 1.6 oz (128 kg)    Physical Exam Vitals and nursing note reviewed. Exam conducted with a chaperone present Wilhemena Durie, CMA).  Constitutional:      General: She is awake. She is not in acute distress.    Appearance: Normal appearance. She is well-developed. She is obese. She is not ill-appearing.  HENT:     Head: Normocephalic and atraumatic.     Right Ear: Hearing, tympanic membrane, ear canal and external ear normal. No drainage.     Left Ear: Hearing, tympanic membrane, ear canal  and external ear normal. No drainage.     Nose: Nose normal.     Right Sinus: No maxillary sinus tenderness or frontal sinus tenderness.     Left Sinus: No maxillary sinus tenderness or frontal sinus tenderness.     Mouth/Throat:     Mouth: Mucous membranes are moist.     Pharynx: Oropharynx is clear. Uvula midline. No pharyngeal swelling, oropharyngeal exudate or posterior oropharyngeal erythema.  Eyes:     General: Lids are normal.        Right eye: No discharge.        Left eye: No discharge.     Extraocular Movements: Extraocular movements intact.     Conjunctiva/sclera: Conjunctivae normal.     Pupils: Pupils are equal, round, and reactive to light.     Visual Fields: Right eye visual fields normal and left eye visual fields normal.  Neck:     Thyroid: No thyromegaly.     Vascular: No carotid bruit.     Trachea: Trachea normal.  Cardiovascular:     Rate and Rhythm: Normal rate and regular rhythm.     Heart sounds: Normal heart sounds. No murmur heard.    No gallop.  Pulmonary:     Effort: Pulmonary effort is normal. No accessory muscle usage or respiratory distress.     Breath sounds: Normal breath sounds.  Chest:  Breasts:    Right: Normal.     Left: Normal.  Abdominal:     General: Bowel sounds are normal.     Palpations: Abdomen is soft. There is no hepatomegaly or splenomegaly.     Tenderness: There is no abdominal tenderness.  Genitourinary:    Vagina: Normal.     Cervix: Normal.     Adnexa: Right adnexa normal and left adnexa normal.  Musculoskeletal:        General: Normal range of motion.  Cervical back: Normal range of motion and neck supple.     Right lower leg: No edema.     Left lower leg: No edema.  Lymphadenopathy:     Head:     Right side of head: No submental, submandibular, tonsillar, preauricular or posterior auricular adenopathy.     Left side of head: No submental, submandibular, tonsillar, preauricular or posterior auricular adenopathy.      Cervical: No cervical adenopathy.     Upper Body:     Right upper body: No supraclavicular, axillary or pectoral adenopathy.     Left upper body: No supraclavicular, axillary or pectoral adenopathy.  Skin:    General: Skin is warm and dry.     Capillary Refill: Capillary refill takes less than 2 seconds.     Findings: No rash.  Neurological:     Mental Status: She is alert and oriented to person, place, and time.     Gait: Gait is intact.     Deep Tendon Reflexes: Reflexes are normal and symmetric.     Reflex Scores:      Brachioradialis reflexes are 2+ on the right side and 2+ on the left side.      Patellar reflexes are 2+ on the right side and 2+ on the left side. Psychiatric:        Attention and Perception: Attention normal.        Mood and Affect: Mood normal.        Speech: Speech normal.        Behavior: Behavior normal. Behavior is cooperative.        Thought Content: Thought content normal.        Judgment: Judgment normal.     Results for orders placed or performed in visit on 07/15/23  TSH   Collection Time: 07/15/23  9:53 AM  Result Value Ref Range   TSH 0.058 (L) 0.450 - 4.500 uIU/mL  T4, free   Collection Time: 07/15/23  9:53 AM  Result Value Ref Range   Free T4 1.38 0.82 - 1.77 ng/dL  Comp Met (CMET)   Collection Time: 07/15/23  9:53 AM  Result Value Ref Range   Glucose 99 70 - 99 mg/dL   BUN 6 6 - 24 mg/dL   Creatinine, Ser 1.61 0.57 - 1.00 mg/dL   eGFR 096 >04 VW/UJW/1.19   BUN/Creatinine Ratio 9 9 - 23   Sodium 139 134 - 144 mmol/L   Potassium 4.5 3.5 - 5.2 mmol/L   Chloride 100 96 - 106 mmol/L   CO2 25 20 - 29 mmol/L   Calcium 8.8 8.7 - 10.2 mg/dL   Total Protein 6.5 6.0 - 8.5 g/dL   Albumin 3.9 3.9 - 4.9 g/dL   Globulin, Total 2.6 1.5 - 4.5 g/dL   Bilirubin Total <1.4 0.0 - 1.2 mg/dL   Alkaline Phosphatase 108 44 - 121 IU/L   AST 19 0 - 40 IU/L   ALT 11 0 - 32 IU/L  Lipid Profile   Collection Time: 07/15/23  9:53 AM  Result Value Ref Range    Cholesterol, Total 158 100 - 199 mg/dL   Triglycerides 782 0 - 149 mg/dL   HDL 50 >95 mg/dL   VLDL Cholesterol Cal 22 5 - 40 mg/dL   LDL Chol Calc (NIH) 86 0 - 99 mg/dL   Chol/HDL Ratio 3.2 0.0 - 4.4 ratio  CBC w/Diff   Collection Time: 07/15/23  9:53 AM  Result Value Ref Range   WBC  7.3 3.4 - 10.8 x10E3/uL   RBC 4.43 3.77 - 5.28 x10E6/uL   Hemoglobin 13.0 11.1 - 15.9 g/dL   Hematocrit 59.5 63.8 - 46.6 %   MCV 92 79 - 97 fL   MCH 29.3 26.6 - 33.0 pg   MCHC 31.9 31.5 - 35.7 g/dL   RDW 75.6 43.3 - 29.5 %   Platelets 302 150 - 450 x10E3/uL   Neutrophils 59 Not Estab. %   Lymphs 30 Not Estab. %   Monocytes 6 Not Estab. %   Eos 4 Not Estab. %   Basos 1 Not Estab. %   Neutrophils Absolute 4.3 1.4 - 7.0 x10E3/uL   Lymphocytes Absolute 2.2 0.7 - 3.1 x10E3/uL   Monocytes Absolute 0.5 0.1 - 0.9 x10E3/uL   EOS (ABSOLUTE) 0.3 0.0 - 0.4 x10E3/uL   Basophils Absolute 0.0 0.0 - 0.2 x10E3/uL   Immature Granulocytes 0 Not Estab. %   Immature Grans (Abs) 0.0 0.0 - 0.1 x10E3/uL  HgB A1c   Collection Time: 07/15/23  9:53 AM  Result Value Ref Range   Hgb A1c MFr Bld 6.3 (H) 4.8 - 5.6 %   Est. average glucose Bld gHb Est-mCnc 134 mg/dL      Assessment & Plan:   Problem List Items Addressed This Visit       Cardiovascular and Mediastinum   Aortic atherosclerosis (HCC)   Chronic.  Controlled.  Continue with current medication regimen on Crestor 10mg  daily.  Labs ordered today.  Return to clinic in 6 months for reevaluation.  Call sooner if concerns arise.         Endocrine   Hypothyroid   Chronic, controlled.  She is currently taking Levothyroxine 150 mcg PO every day and appears to be tolerating well Labs ordered at visit today.  Will adjust medications if needed. Follow up in 6 months or sooner if concerns arise       Relevant Orders   T4, free   IFG (impaired fasting glucose)   Labs ordered at visit today.  Will make recommendations based on lab results.         Relevant  Orders   Hemoglobin A1c     Other   Anxiety   Chronic.  Controlled.  Continue with current medication regimen of Effexor and Wellbutrin.  Uses Buspar PRN.  Labs ordered today.  Return to clinic in 6 months for reevaluation.  Call sooner if concerns arise.       Hyperlipidemia   Chronic, controlled Appears to be tolerating current regimen comprised of Rosuvastatin 10 mg PO every day Recheck lipid panel today- Results to dictate further management  Continue current regimen Follow up in 6 months or sooner if concerns arise       Relevant Orders   Lipid panel   Morbid obesity (HCC)   Recommended eating smaller high protein, low fat meals more frequently and exercising 30 mins a day 5 times a week with a goal of 10-15lb weight loss in the next 3 months.       Depression   Chronic.  Controlled.  Continue with current medication regimen of Effexor and Wellbutrin.  Uses Buspar PRN.  Labs ordered today.  Return to clinic in 6 months for reevaluation.  Call sooner if concerns arise.       Other Visit Diagnoses       Annual physical exam    -  Primary   Health maintenance reviewed during visit today.  Labs ordered.  Vaccines reviewed.  Mammogram ordered. Colonoscopy up to date. Will return for PAP.   Relevant Orders   Hemoglobin A1c   CBC with Differential/Platelet   Comprehensive metabolic panel   Lipid panel   TSH   Urinalysis, Routine w reflex microscopic     Encounter for screening mammogram for malignant neoplasm of breast       Relevant Orders   MM 3D SCREENING MAMMOGRAM BILATERAL BREAST        Follow up plan: Return in about 3 months (around 04/13/2024) for PAP and shingles.   LABORATORY TESTING:  - Pap smear: pap done  IMMUNIZATIONS:   - Tdap: Tetanus vaccination status reviewed: last tetanus booster within 10 years. - Influenza: Refused - Pneumovax: up to date - Prevnar: Not applicable - COVID: Up to date - HPV: Not applicable - Shingrix vaccine:Will get at  next visit  SCREENING: -Mammogram: Ordered today  - Colonoscopy: up to date  - Bone Density: Not applicable  -Hearing Test: Not applicable  -Spirometry: Not applicable   PATIENT COUNSELING:   Advised to take 1 mg of folate supplement per day if capable of pregnancy.   Sexuality: Discussed sexually transmitted diseases, partner selection, use of condoms, avoidance of unintended pregnancy  and contraceptive alternatives.   Advised to avoid cigarette smoking.  I discussed with the patient that most people either abstain from alcohol or drink within safe limits (<=14/week and <=4 drinks/occasion for males, <=7/weeks and <= 3 drinks/occasion for females) and that the risk for alcohol disorders and other health effects rises proportionally with the number of drinks per week and how often a drinker exceeds daily limits.  Discussed cessation/primary prevention of drug use and availability of treatment for abuse.   Diet: Encouraged to adjust caloric intake to maintain  or achieve ideal body weight, to reduce intake of dietary saturated fat and total fat, to limit sodium intake by avoiding high sodium foods and not adding table salt, and to maintain adequate dietary potassium and calcium preferably from fresh fruits, vegetables, and low-fat dairy products.    stressed the importance of regular exercise  Injury prevention: Discussed safety belts, safety helmets, smoke detector, smoking near bedding or upholstery.   Dental health: Discussed importance of regular tooth brushing, flossing, and dental visits.    NEXT PREVENTATIVE PHYSICAL DUE IN 1 YEAR. Return in about 3 months (around 04/13/2024) for PAP and shingles.

## 2024-01-13 ENCOUNTER — Encounter: Payer: Self-pay | Admitting: Nurse Practitioner

## 2024-01-13 LAB — CBC WITH DIFFERENTIAL/PLATELET
Basophils Absolute: 0.1 10*3/uL (ref 0.0–0.2)
Basos: 1 %
EOS (ABSOLUTE): 0.3 10*3/uL (ref 0.0–0.4)
Eos: 5 %
Hematocrit: 36 % (ref 34.0–46.6)
Hemoglobin: 11.5 g/dL (ref 11.1–15.9)
Immature Grans (Abs): 0 10*3/uL (ref 0.0–0.1)
Immature Granulocytes: 0 %
Lymphocytes Absolute: 2 10*3/uL (ref 0.7–3.1)
Lymphs: 31 %
MCH: 27.4 pg (ref 26.6–33.0)
MCHC: 31.9 g/dL (ref 31.5–35.7)
MCV: 86 fL (ref 79–97)
Monocytes Absolute: 0.4 10*3/uL (ref 0.1–0.9)
Monocytes: 6 %
Neutrophils Absolute: 3.8 10*3/uL (ref 1.4–7.0)
Neutrophils: 57 %
Platelets: 307 10*3/uL (ref 150–450)
RBC: 4.2 x10E6/uL (ref 3.77–5.28)
RDW: 14 % (ref 11.7–15.4)
WBC: 6.6 10*3/uL (ref 3.4–10.8)

## 2024-01-13 LAB — TSH: TSH: 0.074 u[IU]/mL — ABNORMAL LOW (ref 0.450–4.500)

## 2024-01-13 LAB — COMPREHENSIVE METABOLIC PANEL
ALT: 11 IU/L (ref 0–32)
AST: 18 IU/L (ref 0–40)
Albumin: 3.9 g/dL (ref 3.9–4.9)
Alkaline Phosphatase: 122 IU/L — ABNORMAL HIGH (ref 44–121)
BUN/Creatinine Ratio: 12 (ref 9–23)
BUN: 9 mg/dL (ref 6–24)
Bilirubin Total: <0.2 mg/dL (ref 0.0–1.2)
CO2: 26 mmol/L (ref 20–29)
Calcium: 9.5 mg/dL (ref 8.7–10.2)
Chloride: 99 mmol/L (ref 96–106)
Creatinine, Ser: 0.76 mg/dL (ref 0.57–1.00)
Globulin, Total: 3 g/dL (ref 1.5–4.5)
Glucose: 96 mg/dL (ref 70–99)
Potassium: 4.9 mmol/L (ref 3.5–5.2)
Sodium: 137 mmol/L (ref 134–144)
Total Protein: 6.9 g/dL (ref 6.0–8.5)
eGFR: 95 mL/min/{1.73_m2} (ref >59)

## 2024-01-13 LAB — LIPID PANEL
Chol/HDL Ratio: 3.1 ratio (ref 0.0–4.4)
Cholesterol, Total: 148 mg/dL (ref 100–199)
HDL: 48 mg/dL (ref >39)
LDL Chol Calc (NIH): 84 mg/dL (ref 0–99)
Triglycerides: 85 mg/dL (ref 0–149)
VLDL Cholesterol Cal: 16 mg/dL (ref 5–40)

## 2024-01-13 LAB — HEMOGLOBIN A1C
Est. average glucose Bld gHb Est-mCnc: 128 mg/dL
Hgb A1c MFr Bld: 6.1 % — ABNORMAL HIGH (ref 4.8–5.6)

## 2024-01-13 LAB — T4, FREE: Free T4: 1.69 ng/dL (ref 0.82–1.77)

## 2024-01-27 ENCOUNTER — Other Ambulatory Visit: Payer: Self-pay | Admitting: Physician Assistant

## 2024-01-27 DIAGNOSIS — F330 Major depressive disorder, recurrent, mild: Secondary | ICD-10-CM

## 2024-01-27 DIAGNOSIS — F17210 Nicotine dependence, cigarettes, uncomplicated: Secondary | ICD-10-CM

## 2024-01-29 NOTE — Telephone Encounter (Signed)
 Requested Prescriptions  Pending Prescriptions Disp Refills   venlafaxine XR (EFFEXOR-XR) 150 MG 24 hr capsule [Pharmacy Med Name: VENLAFAXINE HCL ER 150 MG CAP] 30 capsule 5    Sig: TAKE 1 CAPSULE BY MOUTH DAILY WITH BREAKFAST.     Psychiatry: Antidepressants - SNRI - desvenlafaxine & venlafaxine Failed - 01/29/2024  9:24 AM      Failed - Lipid Panel in normal range within the last 12 months    Cholesterol, Total  Date Value Ref Range Status  01/12/2024 148 100 - 199 mg/dL Final   LDL Chol Calc (NIH)  Date Value Ref Range Status  01/12/2024 84 0 - 99 mg/dL Final   HDL  Date Value Ref Range Status  01/12/2024 48 >39 mg/dL Final   Triglycerides  Date Value Ref Range Status  01/12/2024 85 0 - 149 mg/dL Final         Passed - Cr in normal range and within 360 days    Creatinine  Date Value Ref Range Status  12/15/2012 0.66 0.60 - 1.30 mg/dL Final   Creatinine, Ser  Date Value Ref Range Status  01/12/2024 0.76 0.57 - 1.00 mg/dL Final         Passed - Completed PHQ-2 or PHQ-9 in the last 360 days      Passed - Last BP in normal range    BP Readings from Last 1 Encounters:  01/12/24 107/70         Passed - Valid encounter within last 6 months    Recent Outpatient Visits           2 weeks ago Annual physical exam   [facility] Larae Grooms, NP               buPROPion (WELLBUTRIN XL) 300 MG 24 hr tablet [Pharmacy Med Name: BUPROPION HCL XL 300 MG TABLET] 30 tablet 5    Sig: TAKE 1 TABLET BY MOUTH EVERY DAY     Psychiatry: Antidepressants - bupropion Passed - 01/29/2024  9:24 AM      Passed - Cr in normal range and within 360 days    Creatinine  Date Value Ref Range Status  12/15/2012 0.66 0.60 - 1.30 mg/dL Final   Creatinine, Ser  Date Value Ref Range Status  01/12/2024 0.76 0.57 - 1.00 mg/dL Final         Passed - AST in normal range and within 360 days    AST  Date Value Ref Range Status  01/12/2024 18 0 - 40 IU/L Final    SGOT(AST)  Date Value Ref Range Status  12/15/2012 20 15 - 37 Unit/L Final         Passed - ALT in normal range and within 360 days    ALT  Date Value Ref Range Status  01/12/2024 11 0 - 32 IU/L Final   SGPT (ALT)  Date Value Ref Range Status  12/15/2012 22 12 - 78 U/L Final         Passed - Completed PHQ-2 or PHQ-9 in the last 360 days      Passed - Last BP in normal range    BP Readings from Last 1 Encounters:  01/12/24 107/70         Passed - Valid encounter within last 6 months    Recent Outpatient Visits           2 weeks ago Annual physical exam   [facility] Chrisman,  NP

## 2024-01-31 ENCOUNTER — Other Ambulatory Visit: Payer: Self-pay | Admitting: Nurse Practitioner

## 2024-02-02 NOTE — Telephone Encounter (Signed)
 Requested Prescriptions  Refused Prescriptions Disp Refills   levothyroxine (SYNTHROID) 150 MCG tablet [Pharmacy Med Name: LEVOTHYROXINE 150 MCG TABLET] 90 tablet 0    Sig: TAKE 1 TABLET BY MOUTH EVERY DAY     Endocrinology:  Hypothyroid Agents Failed - 02/02/2024  3:48 PM      Failed - TSH in normal range and within 360 days    TSH  Date Value Ref Range Status  01/12/2024 0.074 (L) 0.450 - 4.500 uIU/mL Final         Passed - Valid encounter within last 12 months    Recent Outpatient Visits           3 weeks ago Annual physical exam   [facility] Larae Grooms, NP

## 2024-02-22 ENCOUNTER — Other Ambulatory Visit: Payer: Self-pay | Admitting: Nurse Practitioner

## 2024-02-22 DIAGNOSIS — F330 Major depressive disorder, recurrent, mild: Secondary | ICD-10-CM

## 2024-02-22 DIAGNOSIS — F17210 Nicotine dependence, cigarettes, uncomplicated: Secondary | ICD-10-CM

## 2024-02-23 NOTE — Telephone Encounter (Signed)
 90 day supply requested

## 2024-03-23 ENCOUNTER — Other Ambulatory Visit: Payer: Self-pay | Admitting: Nurse Practitioner

## 2024-03-24 NOTE — Telephone Encounter (Signed)
 Requested Prescriptions  Pending Prescriptions Disp Refills   rosuvastatin  (CRESTOR ) 10 MG tablet [Pharmacy Med Name: ROSUVASTATIN  CALCIUM  10 MG TAB] 90 tablet 2    Sig: TAKE 1 TABLET BY MOUTH EVERY DAY**OTHER INS?     Cardiovascular:  Antilipid - Statins 2 Failed - 03/24/2024  2:43 PM      Failed - Lipid Panel in normal range within the last 12 months    Cholesterol, Total  Date Value Ref Range Status  01/12/2024 148 100 - 199 mg/dL Final   LDL Chol Calc (NIH)  Date Value Ref Range Status  01/12/2024 84 0 - 99 mg/dL Final   HDL  Date Value Ref Range Status  01/12/2024 48 >39 mg/dL Final   Triglycerides  Date Value Ref Range Status  01/12/2024 85 0 - 149 mg/dL Final         Passed - Cr in normal range and within 360 days    Creatinine  Date Value Ref Range Status  12/15/2012 0.66 0.60 - 1.30 mg/dL Final   Creatinine, Ser  Date Value Ref Range Status  01/12/2024 0.76 0.57 - 1.00 mg/dL Final         Passed - Patient is not pregnant      Passed - Valid encounter within last 12 months    Recent Outpatient Visits           2 months ago Annual physical exam   [facility] Aileen Alexanders, NP

## 2024-04-04 ENCOUNTER — Other Ambulatory Visit: Payer: Self-pay | Admitting: Nurse Practitioner

## 2024-04-05 NOTE — Telephone Encounter (Signed)
 TSH in date.  Requested Prescriptions  Pending Prescriptions Disp Refills   levothyroxine  (SYNTHROID ) 150 MCG tablet [Pharmacy Med Name: LEVOTHYROXINE  150 MCG TABLET] 90 tablet 0    Sig: TAKE 1 TABLET BY MOUTH EVERY DAY     Endocrinology:  Hypothyroid Agents Failed - 04/05/2024 10:26 AM      Failed - TSH in normal range and within 360 days    TSH  Date Value Ref Range Status  01/12/2024 0.074 (L) 0.450 - 4.500 uIU/mL Final         Passed - Valid encounter within last 12 months    Recent Outpatient Visits           2 months ago Annual physical exam   [facility] Aileen Alexanders, NP               omeprazole  (PRILOSEC) 40 MG capsule [Pharmacy Med Name: OMEPRAZOLE  DR 40 MG CAPSULE] 90 capsule 0    Sig: TAKE 1 CAPSULE (40 MG TOTAL) BY MOUTH DAILY.     Gastroenterology: Proton Pump Inhibitors Passed - 04/05/2024 10:26 AM      Passed - Valid encounter within last 12 months    Recent Outpatient Visits           2 months ago Annual physical exam    [facility] Aileen Alexanders, NP

## 2024-04-08 ENCOUNTER — Other Ambulatory Visit: Payer: Self-pay | Admitting: Nurse Practitioner

## 2024-04-11 NOTE — Telephone Encounter (Signed)
 Requested Prescriptions  Pending Prescriptions Disp Refills   valACYclovir  (VALTREX ) 1000 MG tablet [Pharmacy Med Name: VALACYCLOVIR  HCL 1 GRAM TABLET] 90 tablet 0    Sig: TAKE 1 TABLET BY MOUTH EVERY DAY     Antimicrobials:  Antiviral Agents - Anti-Herpetic Passed - 04/11/2024 12:04 PM      Passed - Valid encounter within last 12 months    Recent Outpatient Visits           3 months ago Annual physical exam   [place] [facility] Aileen Alexanders, NP

## 2024-04-13 ENCOUNTER — Other Ambulatory Visit (HOSPITAL_COMMUNITY)
Admission: RE | Admit: 2024-04-13 | Discharge: 2024-04-13 | Disposition: A | Discharge status: Home / Self Care | Source: Ambulatory Visit | Attending: Nurse Practitioner | Admitting: Nurse Practitioner

## 2024-04-13 ENCOUNTER — Ambulatory Visit: Admitting: Nurse Practitioner

## 2024-04-13 ENCOUNTER — Encounter: Payer: Self-pay | Admitting: Nurse Practitioner

## 2024-04-13 VITALS — BP 107/72 | HR 78 | Temp 99.1°F | Resp 15 | Ht 67.52 in | Wt 281.8 lb

## 2024-04-13 DIAGNOSIS — Z23 Encounter for immunization: Secondary | ICD-10-CM | POA: Diagnosis not present

## 2024-04-13 DIAGNOSIS — Z124 Encounter for screening for malignant neoplasm of cervix: Secondary | ICD-10-CM

## 2024-04-13 NOTE — Progress Notes (Signed)
 BP 107/72 (BP Location: Right Arm, Patient Position: Sitting, Cuff Size: Large)   Pulse 78   Temp 99.1 F (37.3 C) (Oral)   Resp 15   Ht 5' 7.52 (1.715 m)   Wt 281 lb 12.8 oz (127.8 kg)   LMP 12/19/2023 (Exact Date)   SpO2 97%   BMI 43.46 kg/m    Subjective:    Patient ID: Lynn French, female    DOB: 04/14/1971, 53 y.o.   MRN: [id number]  HPI: Lynn French is a 53 y.o. female  Chief Complaint  Patient presents with   Gynecologic Exam   Immunizations    Does need shingles and has been provided information to complete through [facility].    Patient presents to clinic for PAP.  Not having any concerns but needs routine screening.  Denies concerns during visit today.       Relevant past medical, surgical, family and social history reviewed and updated as indicated. Interim medical history since our last visit reviewed. Allergies and medications reviewed and updated.  Review of Systems  All other systems reviewed and are negative.   Per HPI unless specifically indicated above     Objective:     BP 107/72 (BP Location: Right Arm, Patient Position: Sitting, Cuff Size: Large)   Pulse 78   Temp 99.1 F (37.3 C) (Oral)   Resp 15   Ht 5' 7.52 (1.715 m)   Wt 281 lb 12.8 oz (127.8 kg)   LMP 12/19/2023 (Exact Date)   SpO2 97%   BMI 43.46 kg/m   Wt Readings from Last 3 Encounters:  04/13/24 281 lb 12.8 oz (127.8 kg)  01/12/24 284 lb 9.6 oz (129.1 kg)  07/15/23 283 lb 12.8 oz (128.7 kg)    Physical Exam Vitals and nursing note reviewed. Exam conducted with a chaperone present (Katie Lumpkins, CMA).  Constitutional:      General: She is not in acute distress.    Appearance: Normal appearance. She is obese. She is not ill-appearing, toxic-appearing or diaphoretic.  HENT:     Head: Normocephalic.     Right Ear: External ear normal.     Left Ear: External ear normal.     Nose: Nose normal.     Mouth/Throat:     Mouth: Mucous membranes are moist.      Pharynx: Oropharynx is clear.  Eyes:     General:        Right eye: No discharge.        Left eye: No discharge.     Extraocular Movements: Extraocular movements intact.     Conjunctiva/sclera: Conjunctivae normal.     Pupils: Pupils are equal, round, and reactive to light.  Cardiovascular:     Rate and Rhythm: Normal rate and regular rhythm.     Heart sounds: No murmur heard. Pulmonary:     Effort: Pulmonary effort is normal. No respiratory distress.     Breath sounds: Normal breath sounds. No wheezing or rales.  Genitourinary:    Vagina: Normal.     Cervix: Normal.     Adnexa: Right adnexa normal and left adnexa normal.  Musculoskeletal:     Cervical back: Normal range of motion and neck supple.  Skin:    General: Skin is warm and dry.     Capillary Refill: Capillary refill takes less than 2 seconds.  Neurological:     General: No focal deficit present.     Mental Status: She is alert and oriented to  person, place, and time. Mental status is at baseline.  Psychiatric:        Mood and Affect: Mood normal.        Behavior: Behavior normal.        Thought Content: Thought content normal.        Judgment: Judgment normal.     Results for orders placed or performed in visit on 01/12/24  Urinalysis, Routine w reflex microscopic   Collection Time: 01/12/24  9:21 AM  Result Value Ref Range   Specific Gravity, UA 1.015 1.005 - 1.030   pH, UA 6.0 5.0 - 7.5   Color, UA Yellow Yellow   Appearance Ur Clear Clear   Leukocytes,UA Negative Negative   Protein,UA Negative Negative/Trace   Glucose, UA Negative Negative   Ketones, UA Negative Negative   RBC, UA Negative Negative   Bilirubin, UA Negative Negative   Urobilinogen, Ur 0.2 0.2 - 1.0 mg/dL   Nitrite, UA Negative Negative   Microscopic Examination Comment   Hemoglobin A1c   Collection Time: 01/12/24  9:22 AM  Result Value Ref Range   Hgb A1c MFr Bld 6.1 (H) 4.8 - 5.6 %   Est. average glucose Bld gHb Est-mCnc 128 mg/dL   CBC with Differential/Platelet   Collection Time: 01/12/24  9:22 AM  Result Value Ref Range   WBC 6.6 3.4 - 10.8 x10E3/uL   RBC 4.20 3.77 - 5.28 x10E6/uL   Hemoglobin 11.5 11.1 - 15.9 g/dL   Hematocrit 16.1 09.6 - 46.6 %   MCV 86 79 - 97 fL   MCH 27.4 26.6 - 33.0 pg   MCHC 31.9 31.5 - 35.7 g/dL   RDW 04.5 40.9 - 81.1 %   Platelets 307 150 - 450 x10E3/uL   Neutrophils 57 Not Estab. %   Lymphs 31 Not Estab. %   Monocytes 6 Not Estab. %   Eos 5 Not Estab. %   Basos 1 Not Estab. %   Neutrophils Absolute 3.8 1.4 - 7.0 x10E3/uL   Lymphocytes Absolute 2.0 0.7 - 3.1 x10E3/uL   Monocytes Absolute 0.4 0.1 - 0.9 x10E3/uL   EOS (ABSOLUTE) 0.3 0.0 - 0.4 x10E3/uL   Basophils Absolute 0.1 0.0 - 0.2 x10E3/uL   Immature Granulocytes 0 Not Estab. %   Immature Grans (Abs) 0.0 0.0 - 0.1 x10E3/uL  Comprehensive metabolic panel   Collection Time: 01/12/24  9:22 AM  Result Value Ref Range   Glucose 96 70 - 99 mg/dL   BUN 9 6 - 24 mg/dL   Creatinine, Ser 9.14 0.57 - 1.00 mg/dL   eGFR 95 >78 GN/FAO/1.30   BUN/Creatinine Ratio 12 9 - 23   Sodium 137 134 - 144 mmol/L   Potassium 4.9 3.5 - 5.2 mmol/L   Chloride 99 96 - 106 mmol/L   CO2 26 20 - 29 mmol/L   Calcium  9.5 8.7 - 10.2 mg/dL   Total Protein 6.9 6.0 - 8.5 g/dL   Albumin 3.9 3.9 - 4.9 g/dL   Globulin, Total 3.0 1.5 - 4.5 g/dL   Bilirubin Total <8.6 0.0 - 1.2 mg/dL   Alkaline Phosphatase 122 (H) 44 - 121 IU/L   AST 18 0 - 40 IU/L   ALT 11 0 - 32 IU/L  Lipid panel   Collection Time: 01/12/24  9:22 AM  Result Value Ref Range   Cholesterol, Total 148 100 - 199 mg/dL   Triglycerides 85 0 - 149 mg/dL   HDL 48 >57 mg/dL   VLDL Cholesterol Cal 16  5 - 40 mg/dL   LDL Chol Calc (NIH) 84 0 - 99 mg/dL   Chol/HDL Ratio 3.1 0.0 - 4.4 ratio  TSH   Collection Time: 01/12/24  9:22 AM  Result Value Ref Range   TSH 0.074 (L) 0.450 - 4.500 uIU/mL  T4, free   Collection Time: 01/12/24  9:22 AM  Result Value Ref Range   Free T4 1.69 0.82 - 1.77  ng/dL      Assessment & Plan:   Problem List Items Addressed This Visit   None Visit Diagnoses       Screening for cervical cancer    -  Primary   Relevant Orders   Cytology - PAP     Need for shingles vaccine       Relevant Orders   Zoster Recombinant (Shingrix ) (Completed)        Follow up plan: Return in about 3 months (around 07/14/2024) for HTN, HLD, DM2 FU.

## 2024-04-18 ENCOUNTER — Ambulatory Visit
Admission: RE | Admit: 2024-04-18 | Discharge: 2024-04-18 | Disposition: A | Discharge status: Home / Self Care | Source: Ambulatory Visit | Attending: Nurse Practitioner | Admitting: Nurse Practitioner

## 2024-04-18 ENCOUNTER — Ambulatory Visit: Payer: Self-pay | Admitting: Nurse Practitioner

## 2024-04-18 DIAGNOSIS — B977 Papillomavirus as the cause of diseases classified elsewhere: Secondary | ICD-10-CM

## 2024-04-18 DIAGNOSIS — Z1231 Encounter for screening mammogram for malignant neoplasm of breast: Secondary | ICD-10-CM | POA: Insufficient documentation

## 2024-05-05 NOTE — Progress Notes (Signed)
 Referring Provider:  Darice Petty.NP  HPI:  Lynn French is a 53 y.o.  [id number]  who presents today for evaluation and management of abnormal cervical cytology.  She has PMH of HTN, is not on BP medications, and denies HA, vision changes, palpitations, dizziness/lightheadedness, or weakness.   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 -History of a high risk partner: unsure -History of STDs: HSV -Smoking: yes -Gardasil Vaccine: no      ROS:  Pertinent items are noted in HPI.  OB History  Gravida Para Term Preterm AB Living  6 2 2  4 2   SAB IAB Ectopic Multiple Live Births  4        # Outcome Date GA Lbr Len/2nd Weight Sex Type Anes PTL Lv  6 Term      CS-LTranv     5 SAB           4 SAB           3 SAB           2 SAB           1 Term      CS-LTranv       Past Medical History:  Diagnosis Date   Abdominal hernia    Acid reflux    Anemia    Anxiety    Atypical squamous cells of undetermined significance (ASCUS) on Papanicolaou smear of cervix April 2016   HPV Negative, next pap due April 2017   B12 deficiency 2000s   history of B12 deficiency   Diabetes mellitus without complication (HCC)    Hyperlipidemia    Hypothyroidism    Obesity    Tachycardia    with palpitations, negative work up.   Tobacco use     Past Surgical History:  Procedure Laterality Date   APPENDECTOMY     BLADDER SURGERY     bladder stem enlarged    CESAREAN SECTION  11/17/11   CHOLECYSTECTOMY     TUBAL LIGATION  11/17/11   TYMPANOSTOMY TUBE PLACEMENT     WISDOM TOOTH EXTRACTION      SOCIAL HISTORY:  Social History   Substance and Sexual Activity  Alcohol Use No   Alcohol/week: 0.0 standard drinks of alcohol   Comment: very rare    Social History   Substance and Sexual Activity  Drug Use No     Family History  Problem Relation Age of Onset   Breast cancer Mother    Mental illness Mother    BRCA 1/2 Maternal Grandmother    Cancer Maternal Grandmother        breast   Allergies Son    Eczema Son     ALLERGIES:  Patient has no known allergies.  She has a current medication list which includes the following prescription(s): acetaminophen, bisacodyl, bupropion , buspirone , vitamin b-12, diclofenac  sodium, levocetirizine, levothyroxine , omeprazole , ondansetron , rosuvastatin , valacyclovir , and venlafaxine  xr.  Physical Exam: -Vitals:  BP (!) 151/81   Pulse  86   Ht 5' 7 (1.702 m)   Wt 278 lb 6.4 oz (126.3 kg)   LMP 12/19/2023 (Exact Date)   BMI 43.60 kg/m   PROCEDURE: Colposcopy performed with 4% acetic acid and Lugol's after informed consent obtained.  Physical Exam Vitals and nursing note reviewed. Exam conducted with a chaperone present.  Constitutional:      Appearance: Normal appearance. She is obese.  HENT:     Head: Normocephalic and atraumatic.  Eyes:     Extraocular Movements: Extraocular movements intact.  Pulmonary:     Effort: Pulmonary effort is normal.     Breath sounds: Wheezing present.  Abdominal:     Hernia: A hernia is present.  Genitourinary:    General: Normal vulva.     Comments: Cervical dilation required for ECC Neurological:     General: No focal deficit present.     Mental Status: She is alert.                               -Aceto-white Lesions Location(s): None              -Biopsy performed: None              -ECC indicated and performed: Yes.     -Satisfactory  colposcopy: Yes.      -Evidence of Invasive cervical CA :  NO  ASSESSMENT:  Lynn French is a 53 y.o. 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  -ECC sent to pathology -Aftercare instructions for home reviewed, si/sx of when to call/return discussed. -Smoking cessation encouraged -BP 148/90, repeat 151/81 -- asymptomatic; discuss with PCP -Will call with results   Estil Mangle, DO [facility]

## 2024-05-09 ENCOUNTER — Ambulatory Visit (INDEPENDENT_AMBULATORY_CARE_PROVIDER_SITE_OTHER): Admitting: Obstetrics

## 2024-05-09 ENCOUNTER — Other Ambulatory Visit (HOSPITAL_COMMUNITY)
Admission: RE | Admit: 2024-05-09 | Discharge: 2024-05-09 | Disposition: A | Discharge status: Home / Self Care | Source: Ambulatory Visit | Attending: Obstetrics | Admitting: Obstetrics

## 2024-05-09 ENCOUNTER — Encounter: Payer: Self-pay | Admitting: Obstetrics

## 2024-05-09 VITALS — BP 151/81 | HR 86 | Ht 67.0 in | Wt 278.4 lb

## 2024-05-09 DIAGNOSIS — R87810 Cervical high risk human papillomavirus (HPV) DNA test positive: Secondary | ICD-10-CM | POA: Insufficient documentation

## 2024-05-09 DIAGNOSIS — N888 Other specified noninflammatory disorders of cervix uteri: Secondary | ICD-10-CM | POA: Diagnosis not present

## 2024-05-09 DIAGNOSIS — B977 Papillomavirus as the cause of diseases classified elsewhere: Secondary | ICD-10-CM

## 2024-05-11 LAB — SURGICAL PATHOLOGY

## 2024-05-16 ENCOUNTER — Ambulatory Visit: Payer: Self-pay | Admitting: Obstetrics

## 2024-05-17 ENCOUNTER — Other Ambulatory Visit: Payer: Self-pay | Admitting: Nurse Practitioner

## 2024-05-18 NOTE — Telephone Encounter (Signed)
 Too soon for refill, refilled on 04/05/24.  Requested Prescriptions  Pending Prescriptions Disp Refills   levothyroxine  (SYNTHROID ) 150 MCG tablet [Pharmacy Med Name: LEVOTHYROXINE  150 MCG TABLET] 90 tablet 0    Sig: TAKE 1 TABLET BY MOUTH EVERY DAY     Endocrinology:  Hypothyroid Agents Failed - 05/18/2024  3:54 PM      Failed - TSH in normal range and within 360 days    TSH  Date Value Ref Range Status  01/12/2024 0.074 (L) 0.450 - 4.500 uIU/mL Final         Passed - Valid encounter within last 12 months    Recent Outpatient Visits           1 month ago Screening for cervical cancer   [place] [facility] Melvin Pao, NP   4 months ago Annual physical exam   [place] [facility] Melvin Pao, NP               omeprazole  (PRILOSEC) 40 MG capsule [Pharmacy Med Name: OMEPRAZOLE  DR 40 MG CAPSULE] 90 capsule 0    Sig: TAKE 1 CAPSULE (40 MG TOTAL) BY MOUTH DAILY.     Gastroenterology: Proton Pump Inhibitors Passed - 05/18/2024  3:54 PM      Passed - Valid encounter within last 12 months    Recent Outpatient Visits           1 month ago Screening for cervical cancer   [place] [facility] Melvin Pao, NP   4 months ago Annual physical exam   [place] [facility] Melvin Pao, NP

## 2024-05-20 ENCOUNTER — Other Ambulatory Visit: Payer: Self-pay | Admitting: Nurse Practitioner

## 2024-05-20 NOTE — Telephone Encounter (Signed)
 Copied from CRM [id number]. Topic: Clinical - Medication Refill >> May 20, 2024  3:37 PM Charlet HERO wrote: Medication: levothyroxine  (SYNTHROID ) 150 MCG tablet, omeprazole  (PRILOSEC) 40 MG capsule  Has the patient contacted their pharmacy? Yes The script was declined  This is the patient's preferred pharmacy:  [id number] - [place], [place] - [address] [place] [address] Phone: [phone] Fax: [phone]  Is this the correct pharmacy for this prescription? Yes If no, delete pharmacy and type the correct one.   Has the prescription been filled recently? Yes  Is the patient out of the medication? Yes  Has the patient been seen for an appointment in the last year OR does the patient have an upcoming appointment? Yes  Can we respond through MyChart? No  Agent: Please be advised that Rx refills may take up to 3 business days. We ask that you follow-up with your pharmacy.

## 2024-05-23 NOTE — Telephone Encounter (Signed)
 Call to pharmacy-[facility]:  Rx for both 04/05/24 #90- too soon. Attempted to call patient- left message too soon for RF- call back 2 weeks prior to running out.  Requested Prescriptions  Pending Prescriptions Disp Refills   levothyroxine  (SYNTHROID ) 150 MCG tablet 90 tablet 0    Sig: Take 1 tablet (150 mcg total) by mouth daily.     Endocrinology:  Hypothyroid Agents Failed - 05/23/2024  3:22 PM      Failed - TSH in normal range and within 360 days    TSH  Date Value Ref Range Status  01/12/2024 0.074 (L) 0.450 - 4.500 uIU/mL Final         Passed - Valid encounter within last 12 months    Recent Outpatient Visits           1 month ago Screening for cervical cancer   [place] [facility] Melvin Pao, NP   4 months ago Annual physical exam   [place] [facility] Melvin Pao, NP               omeprazole  (PRILOSEC) 40 MG capsule 90 capsule 0    Sig: Take 1 capsule (40 mg total) by mouth daily.     Gastroenterology: Proton Pump Inhibitors Passed - 05/23/2024  3:22 PM      Passed - Valid encounter within last 12 months    Recent Outpatient Visits           1 month ago Screening for cervical cancer    [facility] Melvin Pao, NP   4 months ago Annual physical exam    [facility] Melvin Pao, NP

## 2024-05-25 ENCOUNTER — Encounter: Admitting: Obstetrics and Gynecology

## 2024-07-03 ENCOUNTER — Other Ambulatory Visit: Payer: Self-pay | Admitting: Nurse Practitioner

## 2024-07-05 NOTE — Telephone Encounter (Signed)
 Requested Prescriptions  Pending Prescriptions Disp Refills   levothyroxine  (SYNTHROID ) 150 MCG tablet [Pharmacy Med Name: LEVOTHYROXINE  150 MCG TABLET] 90 tablet 0    Sig: TAKE 1 TABLET BY MOUTH EVERY DAY     Endocrinology:  Hypothyroid Agents Failed - 07/05/2024 12:59 PM      Failed - TSH in normal range and within 360 days    TSH  Date Value Ref Range Status  01/12/2024 0.074 (L) 0.450 - 4.500 uIU/mL Final         Passed - Valid encounter within last 12 months    Recent Outpatient Visits           2 months ago Screening for cervical cancer   [place] [facility] Melvin Pao, NP   5 months ago Annual physical exam   [place] [facility] Melvin Pao, NP

## 2024-07-06 ENCOUNTER — Other Ambulatory Visit: Payer: Self-pay | Admitting: Nurse Practitioner

## 2024-07-07 NOTE — Telephone Encounter (Signed)
 Requested Prescriptions  Pending Prescriptions Disp Refills   omeprazole  (PRILOSEC) 40 MG capsule [Pharmacy Med Name: OMEPRAZOLE  DR 40 MG CAPSULE] 90 capsule 1    Sig: TAKE 1 CAPSULE (40 MG TOTAL) BY MOUTH DAILY.     Gastroenterology: Proton Pump Inhibitors Passed - 07/07/2024 12:06 PM      Passed - Valid encounter within last 12 months    Recent Outpatient Visits           2 months ago Screening for cervical cancer   [place] [facility] Melvin Pao, NP   5 months ago Annual physical exam   [place] [facility] Melvin Pao, NP

## 2024-07-20 ENCOUNTER — Ambulatory Visit: Admitting: Nurse Practitioner

## 2024-07-25 ENCOUNTER — Ambulatory Visit: Admitting: Nurse Practitioner

## 2024-07-25 ENCOUNTER — Encounter: Payer: Self-pay | Admitting: Nurse Practitioner

## 2024-07-25 VITALS — BP 134/84 | HR 77 | Temp 98.7°F | Ht 67.5 in | Wt 283.4 lb

## 2024-07-25 DIAGNOSIS — I10 Essential (primary) hypertension: Secondary | ICD-10-CM

## 2024-07-25 DIAGNOSIS — I700 Atherosclerosis of aorta: Secondary | ICD-10-CM | POA: Diagnosis not present

## 2024-07-25 DIAGNOSIS — R7301 Impaired fasting glucose: Secondary | ICD-10-CM | POA: Diagnosis not present

## 2024-07-25 DIAGNOSIS — Z23 Encounter for immunization: Secondary | ICD-10-CM

## 2024-07-25 DIAGNOSIS — E038 Other specified hypothyroidism: Secondary | ICD-10-CM

## 2024-07-25 DIAGNOSIS — F320 Major depressive disorder, single episode, mild: Secondary | ICD-10-CM

## 2024-07-25 DIAGNOSIS — F419 Anxiety disorder, unspecified: Secondary | ICD-10-CM

## 2024-07-25 DIAGNOSIS — D508 Other iron deficiency anemias: Secondary | ICD-10-CM

## 2024-07-25 DIAGNOSIS — E782 Mixed hyperlipidemia: Secondary | ICD-10-CM

## 2024-07-25 NOTE — Assessment & Plan Note (Signed)
 Recommended eating smaller high protein, low fat meals more frequently and exercising 30 mins a day 5 times a week with a goal of 10-15lb weight loss in the next 3 months.

## 2024-07-25 NOTE — Assessment & Plan Note (Signed)
Chronic.  Controlled.  Continue with current medication regimen on Crestor 10mg daily.  Labs ordered today.  Return to clinic in 6 months for reevaluation.  Call sooner if concerns arise.  ? ?

## 2024-07-25 NOTE — Assessment & Plan Note (Signed)
 Chronic, controlled.  She is currently taking Levothyroxine 150 mcg PO every day and appears to be tolerating well Labs ordered at visit today.  Will adjust medications if needed. Follow up in 6 months or sooner if concerns arise

## 2024-07-25 NOTE — Progress Notes (Signed)
 BP 134/84   Pulse 77   Temp 98.7 F (37.1 C) (Oral)   Ht 5' 7.5 (1.715 m)   Wt 283 lb 6.4 oz (128.5 kg)   SpO2 97%   BMI 43.73 kg/m    Subjective:    Patient ID: Lynn French French, female    DOB: February 26, 1971, 53 y.o.   MRN: [id number]    Chief Complaint  Patient presents with   Anxiety   Depression   Hyperlipidemia   Hypothyroidism   HPI: Lynn French French is a 53 y.o. female here for a follow-up on HTN, HLD, anxiety/depression, hypothyroidism, Lynn French a hernia. Pt reports tolerating medications well, denies headaches, dizziness, chest pain, SOB, weakness. Pt states Lynn French French has been having ongoing nausea associated with Lynn French French hernia that has been gradually getting worse over the past few months. Pt reports it is well controlled with zofran , but that Lynn French French has been needing it more often. Pt denies abdominal pain, or bowel changes. Denies diet changes. Pt reports doing well on synthroid , denies increasing fatigue, cold or heat intolerance. Pt reports mood has been doing well on medications, denies SI.   HYPERTENSION / HYPERLIPIDEMIA Satisfied with current treatment? no Duration of hypertension: years BP monitoring frequency: not checking BP range:  BP medication side effects: no Past BP meds: none Duration of hyperlipidemia: years Cholesterol medication side effects: no Cholesterol supplements: none Past cholesterol medications: rosuvastatin  (crestor ) Medication compliance: excellent compliance Aspirin: no Recent stressors: no Recurrent headaches: no Visual changes: no Palpitations: no Dyspnea: no Chest pain: no Lower extremity edema: no Dizzy/lightheaded: no  HYPOTHYROIDISM Thyroid  control status:controlled Satisfied with current treatment? no Medication side effects: no Medication compliance: excellent compliance Etiology of hypothyroidism:  Recent dose adjustment:no Fatigue: getting better Cold intolerance: no Heat intolerance: no Weight gain: no Weight loss:  no Constipation: yes Diarrhea/loose stools: no Palpitations: no Lower extremity edema: no Anxiety/depressed mood: yes    DEPRESSION/ANXIETY Patient states Lynn French French feels like Lynn French French has been doing okay.  Overall Lynn French French feels like Lynn French French is doing pretty well.  Lynn French French does have some times where Lynn French French is down.  On Wellbutrin , Effexor  Lynn French Buspar  PRN.       07/25/2024    8:51 AM 04/13/2024   11:00 AM 01/12/2024    9:05 AM  PHQ9 SCORE ONLY  PHQ-9 Total Score 11 14  13       Data saved with a previous flowsheet row definition      07/25/2024    8:51 AM 04/13/2024   11:01 AM 01/12/2024    9:05 AM 07/15/2023    9:22 AM  GAD 7 : Generalized Anxiety Score  Nervous, Anxious, on Edge 1 2 0 1  Control/stop worrying 0 1 0 1  Worry too much - different things 0 0 1 2  Trouble relaxing 2 0 0 0  Restless 1 1 1 2   Easily annoyed or irritable 3 2 1 3   Afraid - awful might happen 0 1 0 0  Total GAD 7 Score 7 7 3 9   Anxiety Difficulty Somewhat difficult Somewhat difficult  Somewhat difficult       Relevant past medical, surgical, family Lynn French social history reviewed Lynn French updated as indicated. Interim medical history since our last visit reviewed. Allergies Lynn French medications reviewed Lynn French updated.  Review of Systems  Constitutional:  Negative for appetite change, fatigue Lynn French fever.  HENT:  Negative for congestion Lynn French rhinorrhea.   Eyes:  Negative for visual disturbance.  Respiratory:  Negative for cough, chest tightness, shortness of  breath Lynn French wheezing.   Cardiovascular:  Negative for chest pain, palpitations Lynn French leg swelling.  Gastrointestinal:  Positive for nausea. Negative for abdominal pain, diarrhea Lynn French vomiting.  Endocrine: Negative for cold intolerance Lynn French heat intolerance.  Genitourinary:  Negative for frequency Lynn French urgency.  Musculoskeletal:  Negative for arthralgias.  Neurological:  Negative for weakness, light-headedness Lynn French headaches.    Per HPI unless specifically indicated above     Objective:     BP 134/84   Pulse 77   Temp 98.7 F (37.1 C) (Oral)   Ht 5' 7.5 (1.715 m)   Wt 283 lb 6.4 oz (128.5 kg)   SpO2 97%   BMI 43.73 kg/m   Wt Readings from Last 3 Encounters:  07/25/24 283 lb 6.4 oz (128.5 kg)  05/09/24 278 lb 6.4 oz (126.3 kg)  04/13/24 281 lb 12.8 oz (127.8 kg)    Physical Exam Vitals Lynn French nursing note reviewed.  Constitutional:      General: Lynn French French is not in acute distress.    Appearance: Normal appearance. Lynn French French is not ill-appearing, toxic-appearing or diaphoretic.  HENT:     Head: Normocephalic.     Right Ear: External ear normal.     Left Ear: External ear normal.     Nose: Nose normal.     Mouth/Throat:     Mouth: Mucous membranes are moist.     Pharynx: No oropharyngeal exudate or posterior oropharyngeal erythema.  Eyes:     General:        Right eye: No discharge.        Left eye: No discharge.     Extraocular Movements: Extraocular movements intact.     Conjunctiva/sclera: Conjunctivae normal.     Pupils: Pupils are equal, round, Lynn French reactive to light.  Neck:     Vascular: No carotid bruit.  Cardiovascular:     Rate Lynn French Rhythm: Normal rate Lynn French regular rhythm.     Pulses: Normal pulses.     Heart sounds: Normal heart sounds. No murmur heard. Pulmonary:     Effort: Pulmonary effort is normal. No respiratory distress.     Breath sounds: Normal breath sounds. No wheezing, rhonchi or rales.  Abdominal:     General: Abdomen is flat. Bowel sounds are normal.  Musculoskeletal:     Cervical back: Normal range of motion Lynn French neck supple.  Skin:    General: Skin is warm Lynn French dry.     Capillary Refill: Capillary refill takes less than 2 seconds.  Neurological:     General: No focal deficit present.     Mental Status: Lynn French French, Lynn French French, Lynn French French.  Psychiatric:        Mood Lynn French Affect: Mood normal.        Behavior: Behavior normal.        Thought Content: Thought content normal.        Judgment: Judgment normal.     Results for  orders placed or performed in visit on 05/09/24  Surgical pathology   Collection French: 05/09/24  3:49 PM  Result Value Ref Range   SURGICAL PATHOLOGY      SURGICAL PATHOLOGY CASE: [id number] PATIENT: DIAMOND MORGAN Surgical Pathology Report     Clinical History: cervical high risk HPV, 16/18/45 neg, hx of ASCUS (cm)     FINAL MICROSCOPIC DIAGNOSIS:  A. ENDOCERVIX, CURETTAGE: - Fragments of benign endo- Lynn French ectocervix.  GROSS DESCRIPTION:  Received in formalin are 0.2 x 0.1 x 0.1 cm of cloudy mucus.  The specimen is entirely submitted in 1 cassette.  [facility] 05/10/2024)  Final Diagnosis performed by Wally Highland, MD.   Electronically signed 05/11/2024 Technical Lynn French / or Professional components performed at [facility]. [facility], [address], [place], [place] [address].  Immunohistochemistry Technical component (if applicable) was performed at [facility]. [address], [place], [place] [address].   IMMUNOHISTOCHEMISTRY DISCLAIMER (if applicable): Some of these immunohistochemical stains may have been developed Lynn French the performance characteristics determine by [facility]. Some may not have been cleared or approved by the U.[place] Food Lynn French Drug Administration. The FDA has determined that such clearance or approval is not necessary. This test is used for clinical purposes. It should not be regarded as investigational or for research. This laboratory is certified under the Clinical Laboratory Improvement Amendments of 1988 (CLIA-88) as qualified to perform high complexity clinical laboratory testing.  The controls stained appropriately.   IHC stains are performed on formalin fixed, paraffin embedded tissue using a 3,3diaminobenzidine (DAB) chromogen Lynn French Leica Bond Autostainer System. The staining intensity of the nucleus is score manually Lynn French is reported as the percentage of tumor cell nuclei demonstrating specific nuclear  staining. The specimens are fixed in 10% Neutral Formalin for at least 6 hours Lynn French up to 72hrs. These tests are validated on decalcified tissue. Results should be interpreted with caution given the possibility of f alse negative results on decalcified specimens. Antibody Clones are as follows ER-clone 26F, PR-clone 16, Ki67- clone MM1. Some of these immunohistochemical stains may have been developed Lynn French the performance characteristics determined by [facility].       Assessment & Plan:   Problem List Items Addressed This Visit       Cardiovascular Lynn French Mediastinum   Benign hypertension - Primary   Chronic, controlled Appears to be doing well without medications at this French  Recommend checking BP at home for monitoring  Follow up in 6 months or sooner if concerns arise        Relevant Orders   Comprehensive metabolic panel with GFR   Aortic atherosclerosis (HCC)   Chronic.  Controlled.  Continue with current medication regimen on Crestor  10mg  daily.  Labs ordered today.  Return to clinic in 6 months for reevaluation.  Call sooner if concerns arise.       Relevant Orders   T4   TSH     Endocrine   Hypothyroid   Chronic, controlled.  Lynn French French is currently taking Levothyroxine  150 mcg PO every day Lynn French appears to be tolerating well Labs ordered at visit today.  Will adjust medications if needed. Follow up in 6 months or sooner if concerns arise       IFG (impaired fasting glucose)   Labs ordered at visit today.  Will make recommendations based on lab results.         Relevant Orders   Hemoglobin A1c     Other   Anxiety   Chronic.  Controlled.  Continue with current medication regimen of Effexor  Lynn French Wellbutrin .  Uses Buspar  PRN.  Labs ordered today.  Return to clinic in 6 months for reevaluation.  Call sooner if concerns arise.       Hyperlipidemia   Chronic, controlled Appears to be tolerating current regimen comprised of Rosuvastatin  10 mg PO every  day Recheck lipid panel today- Results to dictate further management  Continue current regimen Follow up in 6 months or sooner if concerns arise  Relevant Orders   Lipid panel   Morbid obesity (HCC)   Recommended eating smaller high protein, low fat meals more frequently Lynn French exercising 30 mins a day 5 times a week with a goal of 10-15lb weight loss in the next 3 months.       Depression   Chronic.  Controlled.  Continue with current medication regimen of Effexor  Lynn French Wellbutrin .  Uses Buspar  PRN.  Labs ordered today.  Return to clinic in 6 months for reevaluation.  Call sooner if concerns arise.       Absolute anemia   Chronic. On iron supplement.  Will check labs at visit today. Will make recommendations based on lab results.       Relevant Orders   CBC w/Diff   Other Visit Diagnoses       Need for shingles vaccine       Relevant Orders   Zoster Recombinant (Shingrix  ) (Completed)        Follow up plan: Return in about 6 months (around 01/22/2025) for Physical Lynn French Fasting labs.

## 2024-07-25 NOTE — Assessment & Plan Note (Signed)
Chronic. On iron supplement.  Will check labs at visit today. Will make recommendations based on lab results.

## 2024-07-25 NOTE — Assessment & Plan Note (Signed)
 Chronic.  Controlled.  Continue with current medication regimen of Effexor and Wellbutrin.  Uses Buspar PRN.  Labs ordered today.  Return to clinic in 6 months for reevaluation.  Call sooner if concerns arise.

## 2024-07-25 NOTE — Assessment & Plan Note (Signed)
 Labs ordered at visit today.  Will make recommendations based on lab results.

## 2024-07-25 NOTE — Assessment & Plan Note (Signed)
Chronic,controlled Appears to be tolerating current regimen comprised of Rosuvastatin 10 mg PO every day Recheck lipid panel today- Results to dictate further management  Continue current regimen Follow up in 6 months or sooner if concerns arise

## 2024-07-25 NOTE — Assessment & Plan Note (Signed)
Chronic, controlled Appears to be doing well without medications at this time  Recommend checking BP at home for monitoring  Follow up in 6 months or sooner if concerns arise

## 2024-07-26 ENCOUNTER — Ambulatory Visit: Payer: Self-pay | Admitting: Nurse Practitioner

## 2024-07-26 DIAGNOSIS — I10 Essential (primary) hypertension: Secondary | ICD-10-CM

## 2024-07-26 DIAGNOSIS — E038 Other specified hypothyroidism: Secondary | ICD-10-CM

## 2024-07-26 LAB — COMPREHENSIVE METABOLIC PANEL WITH GFR
ALT: 15 IU/L (ref 0–32)
AST: 21 IU/L (ref 0–40)
Albumin: 3.8 g/dL — ABNORMAL LOW (ref 3.9–4.9)
Alkaline Phosphatase: 153 IU/L — ABNORMAL HIGH (ref 41–116)
BUN/Creatinine Ratio: 10 (ref 9–23)
BUN: 8 mg/dL (ref 6–24)
Bilirubin Total: 0.2 mg/dL (ref 0.0–1.2)
CO2: 25 mmol/L (ref 20–29)
Calcium: 9.1 mg/dL (ref 8.7–10.2)
Chloride: 99 mmol/L (ref 96–106)
Creatinine, Ser: 0.81 mg/dL (ref 0.57–1.00)
Globulin, Total: 3.1 g/dL (ref 1.5–4.5)
Glucose: 109 mg/dL — ABNORMAL HIGH (ref 70–99)
Potassium: 5.7 mmol/L — ABNORMAL HIGH (ref 3.5–5.2)
Sodium: 137 mmol/L (ref 134–144)
Total Protein: 6.9 g/dL (ref 6.0–8.5)
eGFR: 88 mL/min/1.73 (ref >59)

## 2024-07-26 LAB — CBC WITH DIFFERENTIAL/PLATELET
Basophils Absolute: 0 x10E3/uL (ref 0.0–0.2)
Basos: 0 %
EOS (ABSOLUTE): 0.3 x10E3/uL (ref 0.0–0.4)
Eos: 4 %
Hematocrit: 35.4 % (ref 34.0–46.6)
Hemoglobin: 11 g/dL — ABNORMAL LOW (ref 11.1–15.9)
Immature Grans (Abs): 0 x10E3/uL (ref 0.0–0.1)
Immature Granulocytes: 0 %
Lymphocytes Absolute: 2 x10E3/uL (ref 0.7–3.1)
Lymphs: 28 %
MCH: 24.7 pg — ABNORMAL LOW (ref 26.6–33.0)
MCHC: 31.1 g/dL — ABNORMAL LOW (ref 31.5–35.7)
MCV: 79 fL (ref 79–97)
Monocytes Absolute: 0.4 x10E3/uL (ref 0.1–0.9)
Monocytes: 5 %
Neutrophils Absolute: 4.4 x10E3/uL (ref 1.4–7.0)
Neutrophils: 63 %
Platelets: 382 x10E3/uL (ref 150–450)
RBC: 4.46 x10E6/uL (ref 3.77–5.28)
RDW: 16.2 % — ABNORMAL HIGH (ref 11.7–15.4)
WBC: 7 x10E3/uL (ref 3.4–10.8)

## 2024-07-26 LAB — LIPID PANEL
Chol/HDL Ratio: 4.4 ratio (ref 0.0–4.4)
Cholesterol, Total: 197 mg/dL (ref 100–199)
HDL: 45 mg/dL (ref >39)
LDL Chol Calc (NIH): 134 mg/dL — ABNORMAL HIGH (ref 0–99)
Triglycerides: 97 mg/dL (ref 0–149)
VLDL Cholesterol Cal: 18 mg/dL (ref 5–40)

## 2024-07-26 LAB — TSH: TSH: 0.11 u[IU]/mL — ABNORMAL LOW (ref 0.450–4.500)

## 2024-07-26 LAB — T4: T4, Total: 12.6 ug/dL — ABNORMAL HIGH (ref 4.5–12.0)

## 2024-07-26 LAB — HEMOGLOBIN A1C
Est. average glucose Bld gHb Est-mCnc: 137 mg/dL
Hgb A1c MFr Bld: 6.4 % — ABNORMAL HIGH (ref 4.8–5.6)

## 2024-07-27 ENCOUNTER — Other Ambulatory Visit: Payer: Self-pay | Admitting: Nurse Practitioner

## 2024-07-27 MED ORDER — LEVOTHYROXINE SODIUM 175 MCG PO TABS: 175.0000 ug | ORAL_TABLET | Freq: Every day | ORAL | 1 refills | Status: AC

## 2024-07-29 NOTE — Telephone Encounter (Signed)
 Requested Prescriptions  Pending Prescriptions Disp Refills   valACYclovir  (VALTREX ) 1000 MG tablet [Pharmacy Med Name: VALACYCLOVIR  HCL 1 GRAM TABLET] 90 tablet 1    Sig: TAKE 1 TABLET BY MOUTH EVERY DAY     Antimicrobials:  Antiviral Agents - Anti-Herpetic Passed - 07/29/2024 10:01 AM      Passed - Valid encounter within last 12 months    Recent Outpatient Visits           4 days ago Benign hypertension   [place] [facility] Melvin Pao, NP   3 months ago Screening for cervical cancer   [place] [facility] Melvin Pao, NP   6 months ago Annual physical exam   [place] [facility] Melvin Pao, NP

## 2024-08-24 ENCOUNTER — Other Ambulatory Visit: Payer: Self-pay | Admitting: Nurse Practitioner

## 2024-08-24 DIAGNOSIS — F330 Major depressive disorder, recurrent, mild: Secondary | ICD-10-CM

## 2024-08-24 DIAGNOSIS — F17210 Nicotine dependence, cigarettes, uncomplicated: Secondary | ICD-10-CM

## 2024-08-26 NOTE — Telephone Encounter (Signed)
 Requested Prescriptions  Pending Prescriptions Disp Refills   buPROPion  (WELLBUTRIN  XL) 300 MG 24 hr tablet [Pharmacy Med Name: BUPROPION  HCL XL 300 MG TABLET] 90 tablet 1    Sig: TAKE 1 TABLET BY MOUTH EVERY DAY     Psychiatry: Antidepressants - bupropion  Passed - 08/26/2024 10:48 AM      Passed - Cr in normal range and within 360 days    Creatinine  Date Value Ref Range Status  12/15/2012 0.66 0.60 - 1.30 mg/dL Final   Creatinine, Ser  Date Value Ref Range Status  07/25/2024 0.81 0.57 - 1.00 mg/dL Final         Passed - AST in normal range and within 360 days    AST  Date Value Ref Range Status  07/25/2024 21 0 - 40 IU/L Final   SGOT(AST)  Date Value Ref Range Status  12/15/2012 20 15 - 37 Unit/L Final         Passed - ALT in normal range and within 360 days    ALT  Date Value Ref Range Status  07/25/2024 15 0 - 32 IU/L Final   SGPT (ALT)  Date Value Ref Range Status  12/15/2012 22 12 - 78 U/L Final         Passed - Completed PHQ-2 or PHQ-9 in the last 360 days      Passed - Last BP in normal range    BP Readings from Last 1 Encounters:  07/25/24 134/84         Passed - Valid encounter within last 6 months    Recent Outpatient Visits           1 month ago Benign hypertension   [place] [facility] Melvin Pao, NP   4 months ago Screening for cervical cancer   [place] [facility] Melvin Pao, NP   7 months ago Annual physical exam   [place] [facility] Melvin Pao, NP               venlafaxine  XR (EFFEXOR -XR) 150 MG 24 hr capsule [Pharmacy Med Name: VENLAFAXINE  HCL ER 150 MG CAP] 90 capsule 1    Sig: TAKE 1 CAPSULE BY MOUTH DAILY WITH BREAKFAST.     Psychiatry: Antidepressants - SNRI - desvenlafaxine & venlafaxine  Failed - 08/26/2024 10:48 AM      Failed - Lipid Panel in normal range within the last 12 months    Cholesterol, Total  Date Value Ref Range Status  07/25/2024 197 100 -  199 mg/dL Final   LDL Chol Calc (NIH)  Date Value Ref Range Status  07/25/2024 134 (H) 0 - 99 mg/dL Final   HDL  Date Value Ref Range Status  07/25/2024 45 >39 mg/dL Final   Triglycerides  Date Value Ref Range Status  07/25/2024 97 0 - 149 mg/dL Final         Passed - Cr in normal range and within 360 days    Creatinine  Date Value Ref Range Status  12/15/2012 0.66 0.60 - 1.30 mg/dL Final   Creatinine, Ser  Date Value Ref Range Status  07/25/2024 0.81 0.57 - 1.00 mg/dL Final         Passed - Completed PHQ-2 or PHQ-9 in the last 360 days      Passed - Last BP in normal range    BP Readings from Last 1 Encounters:  07/25/24 134/84         Passed - Valid encounter  within last 6 months    Recent Outpatient Visits           1 month ago Benign hypertension   [place] [facility] Melvin Pao, NP   4 months ago Screening for cervical cancer   [place] [facility] Melvin Pao, NP   7 months ago Annual physical exam   [place] [facility] Melvin Pao, NP

## 2024-08-30 ENCOUNTER — Other Ambulatory Visit: Payer: Self-pay | Admitting: Nurse Practitioner

## 2024-09-01 NOTE — Telephone Encounter (Signed)
 Requested medication (s) are due for refill today: Yes  Requested medication (s) are on the active medication list: Yes  Last refill:    Future visit scheduled: Yes  Notes to clinic:  Not delegated.    Requested Prescriptions  Pending Prescriptions Disp Refills   ondansetron  (ZOFRAN -ODT) 4 MG disintegrating tablet [Pharmacy Med Name: ONDANSETRON  ODT 4 MG TABLET] 18 tablet 2    Sig: DISSOLVE 1 TABLET IN MOUTH EVERY 8 HOURS AS NEEDED FOR NAUSEA FOR VOMITING     Not Delegated - Gastroenterology: Antiemetics - ondansetron  Failed - 09/01/2024 12:50 PM      Failed - This refill cannot be delegated      Passed - AST in normal range and within 360 days    AST  Date Value Ref Range Status  07/25/2024 21 0 - 40 IU/L Final   SGOT(AST)  Date Value Ref Range Status  12/15/2012 20 15 - 37 Unit/L Final         Passed - ALT in normal range and within 360 days    ALT  Date Value Ref Range Status  07/25/2024 15 0 - 32 IU/L Final   SGPT (ALT)  Date Value Ref Range Status  12/15/2012 22 12 - 78 U/L Final         Passed - Valid encounter within last 6 months    Recent Outpatient Visits           1 month ago Benign hypertension   [place] [facility] Melvin Pao, NP   4 months ago Screening for cervical cancer   [place] [facility] Melvin Pao, NP   7 months ago Annual physical exam    [facility] Melvin Pao, NP

## 2024-10-06 ENCOUNTER — Other Ambulatory Visit: Payer: Self-pay | Admitting: Nurse Practitioner

## 2024-10-10 NOTE — Telephone Encounter (Signed)
 Discontinued 07/27/24, dose change.  Requested Prescriptions  Pending Prescriptions Disp Refills   levothyroxine  (SYNTHROID ) 150 MCG tablet [Pharmacy Med Name: LEVOTHYROXINE  150 MCG TABLET] 90 tablet 0    Sig: TAKE 1 TABLET BY MOUTH EVERY DAY     Endocrinology:  Hypothyroid Agents Failed - 10/10/2024 12:51 PM      Failed - TSH in normal range and within 360 days    TSH  Date Value Ref Range Status  07/25/2024 0.110 (L) 0.450 - 4.500 uIU/mL Final         Passed - Valid encounter within last 12 months    Recent Outpatient Visits           2 months ago Benign hypertension   [place] [facility] Melvin Pao, NP   6 months ago Screening for cervical cancer   [place] [facility] Melvin Pao, NP   9 months ago Annual physical exam   [place] [facility] Melvin Pao, NP

## 2024-10-19 ENCOUNTER — Telehealth: Payer: Self-pay | Admitting: Nurse Practitioner

## 2024-10-19 ENCOUNTER — Other Ambulatory Visit: Payer: Self-pay

## 2024-10-19 NOTE — Telephone Encounter (Signed)
 The prescription was denied for Levothyroxine  150mcg because the dose was increased in September to 175mcg daily.

## 2024-10-19 NOTE — Telephone Encounter (Signed)
 Copied from CRM #[id number]. Topic: Clinical - Medication Question >> Oct 19, 2024  9:25 AM [name] wrote: Reason for RMF:Ejup[facility]u called in regarding prescription levothyroxine  (SYNTHROID ) 150 MCG tablet Stated it was denied , patient would like for medication to be sent to the pharmacy

## 2024-11-24 ENCOUNTER — Ambulatory Visit: Payer: Self-pay

## 2024-11-24 NOTE — Telephone Encounter (Signed)
 FYI Only or Action Required?: FYI only for provider: appointment scheduled on 1.23.26.  Patient was last seen in primary care on 07/25/2024 by Melvin Pao, NP.  Called Nurse Triage reporting Dental Pain.  Symptoms began several days ago.  Interventions attempted: OTC medications: tylenol.  Symptoms are: unchanged.  Triage Disposition: See HCP Within 4 Hours (Or PCP Triage)  Patient/caregiver understands and will follow disposition?:   Reason for Triage: patient is in severe pain from tooth ache Reason for Disposition  [1] SEVERE pain (e.g., excruciating, unable to eat, unable to do any normal activities) AND [2] not improved 2 hours after pain medicine  Answer Assessment - Initial Assessment Questions Pt's dentist retired and sold his practice and he is the only one she ever went to. She is scared but does have an appt with another dentist but not until 2.19.26. She states she has 2 teeth that are bad, one had a hole in it. She states tylenol is not touching the pain. It is on the left side back, top and bottom. Pt scheduled     1. LOCATION: Which tooth is hurting?  (e.g., right-side/left-side, upper/lower, front/back)     Left side, top and bottom, backside 2. ONSET: When did the toothache start?  (e.g., hours, days)       3. SEVERITY: How bad is the toothache?  (Scale 1-10; mild, moderate or severe)     Moderate to severe 4. SWELLING: Is there any visible swelling of your face?     denies 5. OTHER SYMPTOMS: Do you have any other symptoms? (e.g., fever)     denies  Protocols used: Toothache-A-AH

## 2024-11-25 ENCOUNTER — Ambulatory Visit

## 2024-11-25 VITALS — BP 146/98 | HR 102 | Ht 67.0 in | Wt 274.6 lb

## 2024-11-25 DIAGNOSIS — K029 Dental caries, unspecified: Secondary | ICD-10-CM | POA: Diagnosis not present

## 2024-11-25 MED ORDER — TRAMADOL HCL 50 MG PO TABS: 50.0000 mg | ORAL_TABLET | Freq: Three times a day (TID) | ORAL | 0 refills | Status: AC | PRN

## 2024-11-25 NOTE — Progress Notes (Signed)
 "    Acute Patient Visit  Physician: Eda Magnussen A Shaquan Puerta, MD  Patient: Lynn French MRN: [id number] DOB: May 25, 1971 PCP: Melvin Pao, NP     Subjective:   Chief Complaint  Patient presents with   Dental Pain    Patient wants pain medicine for her teeth on the left side. Upper and bottom teeth are bothering her.     HPI: The patient is a 54 y.o. female who presents today for:   Discussed the use of AI scribe software for clinical note transcription with the patient, who gave verbal consent to proceed.  History of Present Illness   Lynn French is a 54 year old female who presents with dental pain.  Odontalgia  - Pain localized to upper and lower posterior molars, 3rd, 4th left, present for at least two months - Pain is daily and gradually worsening - Pain is similar to previous abscessed wisdom tooth, but not believed to have reached abscess stage - Top tooth has a large hole; bottom tooth is cracked and causes tongue irritation  Symptom management and response to treatment - Tylenol 500 mg, two tablets at a time, provides no relief - Brushing teeth more frequently provides some relief - Heat application and avoidance of extreme food and drink temperatures help manage pain  Associated symptoms and findings - No current fever - No neck swelling - No facial swelling at present - History of facial swelling with previous dental abscess  Dental care and follow-up - Dental appointment scheduled for February 19th, earliest available          ROS:   As noted in the HPI    ASSESMENT/PLAN:  Encounter Diagnoses  Name Primary?   Dental caries Yes    No orders of the defined types were placed in this encounter.   Assessment and Plan    Dental caries  Chronic dental pain due to cracked tooth and large cavity. Potential dental abscess not confirmed. Pain not controlled with Tylenol. Tramadol  provided some relief. Discussed need for dental intervention and  potential antibiotics if symptoms worsen, fever, increased pain, facial swelling.   - Prescribed tramadol  50 mg for pain. - Advised Orajel for gum numbing. - Recommended salt water rinse - Instructed to monitor for infection signs and seek emergent care if needed. - Advised to avoid extreme food temperatures and use heat or ice for pain relief. - Sent prescription to pharmacy.            OBJECTIVE: Vitals:   11/25/24 0911  BP: (!) 146/98  Pulse: (!) 102  SpO2: 98%  Weight: 274 lb 9.6 oz (124.6 kg)  Height: 5' 7 (1.702 m)    Body mass index is 43.01 kg/m.   Physical Exam Vitals reviewed.  Constitutional:      Appearance: Normal appearance. Well-developed with normal weight.  Cardiovascular:     Rate and Rhythm: Normal rate and regular rhythm. Normal heart sounds. Normal peripheral pulses Pulmonary:     Normal breath sounds with normal effort Skin:    General: Skin is warm and dry without noticeable rash. Neurological:     General: No focal deficit present.  Psychiatric:        Mood and Affect: Mood, behavior and cognition normal       Allergies Patient has no known allergies.  Past Medical History Patient  has a past medical history of Abdominal hernia, Acid reflux, Anemia, Anxiety, Atypical squamous cells of undetermined significance (ASCUS) on Papanicolaou smear of  cervix (April 2016), B12 deficiency (2000s), Diabetes mellitus without complication (HCC), Hyperlipidemia, Hypothyroidism, Obesity, Tachycardia, and Tobacco use.  Surgical History Patient  has a past surgical history that includes Appendectomy; Cholecystectomy; Tubal ligation (11/17/11); Cesarean section (11/17/11); Tympanostomy tube placement; Bladder surgery; and Wisdom tooth extraction.  Family History Pateint's family history includes Allergies in her son; BRCA 1/2 in her maternal grandmother; Breast cancer in her mother; Cancer in her maternal grandmother; Eczema in her son; Mental illness in her  mother.  Social History Patient  reports that she has been smoking cigarettes. She has a 10 pack-year smoking history. She has never used smokeless tobacco. She reports that she does not drink alcohol and does not use drugs.    11/25/2024 "

## 2025-01-24 ENCOUNTER — Ambulatory Visit: Admitting: Nurse Practitioner
